# Patient Record
Sex: Male | Born: 1935
Health system: Southern US, Community
[De-identification: ages and names within clinical notes are randomized; demographics above are authoritative.]

## PROBLEM LIST (undated history)

## (undated) DIAGNOSIS — N189 Chronic kidney disease, unspecified: Secondary | ICD-10-CM

## (undated) DIAGNOSIS — E785 Hyperlipidemia, unspecified: Secondary | ICD-10-CM

## (undated) DIAGNOSIS — D141 Benign neoplasm of larynx: Secondary | ICD-10-CM

## (undated) DIAGNOSIS — I2510 Atherosclerotic heart disease of native coronary artery without angina pectoris: Secondary | ICD-10-CM

## (undated) DIAGNOSIS — C61 Malignant neoplasm of prostate: Secondary | ICD-10-CM

## (undated) DIAGNOSIS — K635 Polyp of colon: Secondary | ICD-10-CM

## (undated) DIAGNOSIS — I10 Essential (primary) hypertension: Secondary | ICD-10-CM

## (undated) HISTORY — DX: Atherosclerotic heart disease of native coronary artery without angina pectoris: I25.10

## (undated) HISTORY — DX: Polyp of colon: K63.5

## (undated) HISTORY — DX: Chronic kidney disease, unspecified: N18.9

## (undated) HISTORY — PX: POLYPECTOMY: SHX149

## (undated) HISTORY — DX: Benign neoplasm of larynx: D14.1

## (undated) HISTORY — DX: Hyperlipidemia, unspecified: E78.5

## (undated) HISTORY — PX: PROSTATE SURGERY: SHX751

## (undated) HISTORY — PX: BLADDER SURGERY: SHX569

---

## 1999-11-16 ENCOUNTER — Encounter: Payer: Self-pay | Admitting: Emergency Medicine

## 1999-11-16 ENCOUNTER — Emergency Department (HOSPITAL_COMMUNITY): Admission: EM | Admit: 1999-11-16 | Discharge: 1999-11-16 | Payer: Self-pay | Admitting: Emergency Medicine

## 2003-07-31 ENCOUNTER — Encounter: Admission: RE | Admit: 2003-07-31 | Discharge: 2003-07-31 | Payer: Self-pay | Admitting: Family Medicine

## 2003-11-06 ENCOUNTER — Ambulatory Visit (HOSPITAL_COMMUNITY): Admission: RE | Admit: 2003-11-06 | Discharge: 2003-11-06 | Payer: Self-pay | Admitting: Gastroenterology

## 2003-11-06 ENCOUNTER — Encounter (INDEPENDENT_AMBULATORY_CARE_PROVIDER_SITE_OTHER): Payer: Self-pay | Admitting: Specialist

## 2004-03-24 ENCOUNTER — Encounter: Admission: RE | Admit: 2004-03-24 | Discharge: 2004-03-24 | Payer: Self-pay | Admitting: Interventional Cardiology

## 2004-03-25 ENCOUNTER — Inpatient Hospital Stay (HOSPITAL_BASED_OUTPATIENT_CLINIC_OR_DEPARTMENT_OTHER): Admission: RE | Admit: 2004-03-25 | Discharge: 2004-03-25 | Payer: Self-pay | Admitting: Interventional Cardiology

## 2004-04-14 ENCOUNTER — Encounter (HOSPITAL_COMMUNITY): Admission: RE | Admit: 2004-04-14 | Discharge: 2004-07-13 | Payer: Self-pay | Admitting: Interventional Cardiology

## 2004-07-14 ENCOUNTER — Encounter (HOSPITAL_COMMUNITY): Admission: RE | Admit: 2004-07-14 | Discharge: 2004-07-19 | Payer: Self-pay | Admitting: Interventional Cardiology

## 2004-07-29 ENCOUNTER — Encounter (HOSPITAL_COMMUNITY): Admission: RE | Admit: 2004-07-29 | Discharge: 2004-10-27 | Payer: Self-pay | Admitting: Interventional Cardiology

## 2004-10-29 ENCOUNTER — Encounter (HOSPITAL_COMMUNITY): Admission: RE | Admit: 2004-10-29 | Discharge: 2005-01-27 | Payer: Self-pay | Admitting: Interventional Cardiology

## 2005-06-05 ENCOUNTER — Ambulatory Visit (HOSPITAL_COMMUNITY): Admission: RE | Admit: 2005-06-05 | Discharge: 2005-06-05 | Payer: Self-pay | Admitting: Otolaryngology

## 2005-06-05 ENCOUNTER — Encounter (INDEPENDENT_AMBULATORY_CARE_PROVIDER_SITE_OTHER): Payer: Self-pay | Admitting: *Deleted

## 2005-10-01 ENCOUNTER — Ambulatory Visit (HOSPITAL_COMMUNITY): Admission: RE | Admit: 2005-10-01 | Discharge: 2005-10-01 | Payer: Self-pay | Admitting: Otolaryngology

## 2006-06-22 ENCOUNTER — Ambulatory Visit: Payer: Self-pay | Admitting: Internal Medicine

## 2006-07-06 ENCOUNTER — Encounter: Admission: RE | Admit: 2006-07-06 | Discharge: 2006-07-06 | Payer: Self-pay | Admitting: Otolaryngology

## 2006-07-06 ENCOUNTER — Ambulatory Visit (HOSPITAL_BASED_OUTPATIENT_CLINIC_OR_DEPARTMENT_OTHER): Admission: RE | Admit: 2006-07-06 | Discharge: 2006-07-06 | Payer: Self-pay | Admitting: Otolaryngology

## 2006-07-26 ENCOUNTER — Ambulatory Visit (HOSPITAL_COMMUNITY): Admission: RE | Admit: 2006-07-26 | Discharge: 2006-07-26 | Payer: Self-pay | Admitting: Otolaryngology

## 2006-08-05 ENCOUNTER — Ambulatory Visit: Payer: Self-pay | Admitting: Family Medicine

## 2006-08-16 ENCOUNTER — Ambulatory Visit: Payer: Self-pay | Admitting: Family Medicine

## 2006-08-16 LAB — CONVERTED CEMR LAB
Chloride: 111 meq/L (ref 96–112)
Chol/HDL Ratio, serum: 2.8
Cholesterol: 119 mg/dL (ref 0–200)
GFR calc non Af Amer: 46 mL/min
Glomerular Filtration Rate, Af Am: 55 mL/min/{1.73_m2}
Glucose, Bld: 110 mg/dL — ABNORMAL HIGH (ref 70–99)
HDL: 42.4 mg/dL (ref >39.0)
LDL Cholesterol: 56 mg/dL (ref 0–99)
Potassium: 4 meq/L (ref 3.5–5.1)
Sodium: 141 meq/L (ref 135–145)
Triglyceride fasting, serum: 101 mg/dL (ref 0–149)

## 2006-09-02 ENCOUNTER — Ambulatory Visit: Payer: Self-pay | Admitting: Family Medicine

## 2006-11-04 ENCOUNTER — Ambulatory Visit: Payer: Self-pay | Admitting: Family Medicine

## 2006-11-04 LAB — CONVERTED CEMR LAB
Creatinine,U: 67.1 mg/dL
Hgb A1c MFr Bld: 7.5 % — ABNORMAL HIGH (ref 4.6–6.0)

## 2007-01-17 ENCOUNTER — Encounter (INDEPENDENT_AMBULATORY_CARE_PROVIDER_SITE_OTHER): Payer: Self-pay | Admitting: Family Medicine

## 2007-02-01 DIAGNOSIS — J309 Allergic rhinitis, unspecified: Secondary | ICD-10-CM | POA: Insufficient documentation

## 2007-02-01 DIAGNOSIS — E109 Type 1 diabetes mellitus without complications: Secondary | ICD-10-CM | POA: Insufficient documentation

## 2007-02-01 DIAGNOSIS — I10 Essential (primary) hypertension: Secondary | ICD-10-CM | POA: Insufficient documentation

## 2007-02-01 DIAGNOSIS — I455 Other specified heart block: Secondary | ICD-10-CM

## 2007-02-03 ENCOUNTER — Encounter: Payer: Self-pay | Admitting: Family Medicine

## 2007-02-03 ENCOUNTER — Encounter (INDEPENDENT_AMBULATORY_CARE_PROVIDER_SITE_OTHER): Payer: Self-pay | Admitting: Family Medicine

## 2007-02-03 ENCOUNTER — Ambulatory Visit: Payer: Self-pay | Admitting: Family Medicine

## 2007-02-03 DIAGNOSIS — E785 Hyperlipidemia, unspecified: Secondary | ICD-10-CM

## 2007-04-21 ENCOUNTER — Encounter: Payer: Self-pay | Admitting: Pulmonary Disease

## 2007-04-25 ENCOUNTER — Ambulatory Visit (HOSPITAL_BASED_OUTPATIENT_CLINIC_OR_DEPARTMENT_OTHER): Admission: RE | Admit: 2007-04-25 | Discharge: 2007-04-25 | Payer: Self-pay | Admitting: Otolaryngology

## 2007-05-03 ENCOUNTER — Encounter (INDEPENDENT_AMBULATORY_CARE_PROVIDER_SITE_OTHER): Payer: Self-pay | Admitting: Family Medicine

## 2007-11-21 ENCOUNTER — Encounter: Admission: RE | Admit: 2007-11-21 | Discharge: 2008-02-19 | Payer: Self-pay | Admitting: Otolaryngology

## 2008-06-06 ENCOUNTER — Encounter: Admission: RE | Admit: 2008-06-06 | Discharge: 2008-06-06 | Payer: Self-pay | Admitting: Internal Medicine

## 2009-09-28 HISTORY — PX: CIRCUMCISION: SUR203

## 2010-10-20 ENCOUNTER — Encounter: Payer: Self-pay | Admitting: Internal Medicine

## 2011-02-10 NOTE — Op Note (Signed)
NAME:  Aaron Carlson, Aaron Carlson NO.:  [id number]   MEDICAL RECORD NO.:  [id number]          PATIENT TYPE:  AMB   LOCATION:  DSC                          FACILITY:  [facility]   PHYSICIAN:  Jefry H. 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   PROCEDURE:  Laser microlaryngoscopy.   SURGEON:  Jefry H. Pollyann Kennedy, MD   General endotracheal anesthesia was used.  No complications.   FINDINGS:  Small buds of papillomatous tissue, right mid membranous  vocal fold.  Remainder of the larynx was normal.  No complications.  No  blood loss.   HISTORY:  A 77 year old gentleman with a history of recurrent  respiratory papillomatosis as an adult.  He has had recent worsening of  his voice again.  Risks, benefits, alternatives, complications of the  procedure were explained to the patient, who seemed to understand and  agreed to surgery.   PROCEDURE:  The patient was taken to the operating room and placed on  the operating table in supine position.  Following induction of general  endotracheal anesthesia, the table was turned and the patient was draped  in a standard fashion.  A maxillary tooth protector was used.  A Jako  laryngoscope was entered into the oral cavity, used to view the larynx,  and secured to the Mayo stand with the suspension apparatus.  The  microscope was brought into the field as well.  The area of the larynx  was closely inspected under microscope.  A carbon dioxide laser was  attached and a focus beam of 1 watt continuous power was used to ablate  all of the abnormal papillomatous tissue.  There was no bleeding.  The  laryngoscope was removed and the patient was awakened, extubated, and  transferred to recovery in stable condition.      Jefry H. Pollyann Kennedy, MD  Electronically Signed     JHR/MEDQ  D:  04/25/2007  T:  04/26/2007  Job:  [id number]

## 2011-02-13 NOTE — Cardiovascular Report (Signed)
NAME:  Aaron Carlson, Aaron Carlson NO.:  [id number]   MEDICAL RECORD NO.:  [id number]                   PATIENT TYPE:  OIB   LOCATION:  6501                                 FACILITY:  [facility]   PHYSICIAN:  Lesleigh Noe, M.D.            DATE OF BIRTH:  1932/12/11   DATE OF PROCEDURE:  DATE OF DISCHARGE:                              CARDIAC CATHETERIZATION   INDICATION:  Abnormal Cardiolite study in this gentleman who gave Dr.  __________a history of atypical chest pain and exertional dyspnea.  He is  diabetic.  Cardiolite study demonstrated inferior ischemia.  Procedure is  being done to rule out significant coronary artery disease.   PROCEDURE PERFORMED:  1. Left-heart catheterization.  2. Selective coronary angiogram.  3. Left ventriculography.   DESCRIPTION OF PROCEDURE:  After informed consent, a 4-French sheath was  placed in the right femoral artery using the modified Seldinger technique.  A 4-French A2 multipurpose catheter was used for hemodynamic recordings,  left ventriculography by hand injection, and selective left and right  coronary angiography.  Left coronary angiography was performed with a 4-  [place] #4, left Judkins catheter.  The patient tolerated the procedure  without complications.  Hemostasis was achieved without difficulty.   RESULTS:  1. Hemodynamic data.     A. Left ventricular pressure 159/11.     B. Aortic pressure 158/80.  2. Left ventriculography:  The left ventricular cavity is upper normal.     Overall, contractility is normal.  EF is 60%.  No regional wall motion     abnormality is noted.  No mitral regurgitation is noted.  3. Coronary angiography.     A. Left main coronary:  Calcified.  No significant obstruction.     B. Left anterior descending coronary:  Proximal calcification is noted.        There is eccentric mid 60 to 70% stenosis, and there is distal 90%        stenosis with moderate diffuse disease noted in the  distal segment        beyond the stenosis.  The first diagonal is large and contains a 70 to        80% mid stenosis with diffuse narrowing beyond this region of        obstruction noted.  The diagonal appears graftable.  The distal LAD        may not be graftable.     C. Circumflex artery:  The circumflex coronary artery is diffusely        disease.  The first obtuse marginal is moderate in size and contains        70 to 80% mid-vessel stenosis.  Beyond this, there is possible        graftability.  The circumflex beyond the first obtuse marginal branch        is 70 to 80% narrowed.  The second obtuse marginal is  large.     D. Right coronary:  The right coronary is dominant.  It gives origin to        three left ventricular branches.  The third LV branch is large and        bifurcates.  This is diffusely diseased and contains 90% proximal        narrowing.  The PDA is 100% obstructed in its mid portion.  There is        late left-to-right filling of the distal PDA.  The mid RCA contains 40        to 60% mid vessel narrowing.  There is calcification noted in the mid        and proximal RCA.   CONCLUSIONS:  1. Severe, diffuse, diabetic disease with high-grade obstruction in each of     the main coronary territories. The mid PDA is totally occluded.  A large     left ventricular branch contains high-grade proximal obstruction.  The     mid RCA is 50% narrowed.  The mid LAD is 60 to 70% narrowed.  The distal     LAD is 90% narrowed.  The diagonal contains high-grade obstruction in the     mid and distal vessel as well.  The circumflex first obtuse marginal     branch is 80% obstruction as is the mid circumflex after this obtuse     marginal.  2. Normal LV function.   PLAN:  Continued, aggressive risk-factor modification/ medical therapy  versus coronary artery bypass grafting.  We will refer for surgical opinion.                                               Lesleigh Noe,  M.D.    HWS/MEDQ  D:  03/25/2004  T:  03/25/2004  Job:  [id number]   cc:   Selmer Dominion, Dr.   Hale Drone

## 2011-04-13 ENCOUNTER — Other Ambulatory Visit: Payer: Self-pay | Admitting: Urology

## 2011-04-13 ENCOUNTER — Encounter (HOSPITAL_COMMUNITY): Payer: Medicare Other

## 2011-04-13 LAB — BASIC METABOLIC PANEL WITH GFR
BUN: 32 mg/dL — ABNORMAL HIGH (ref 6–23)
CO2: 22 meq/L (ref 19–32)
Calcium: 10 mg/dL (ref 8.4–10.5)
Chloride: 108 meq/L (ref 96–112)
Creatinine, Ser: 1.68 mg/dL — ABNORMAL HIGH (ref 0.50–1.35)
GFR calc Af Amer: 48 mL/min — ABNORMAL LOW
GFR calc non Af Amer: 40 mL/min — ABNORMAL LOW
Glucose, Bld: 122 mg/dL — ABNORMAL HIGH (ref 70–99)
Potassium: 4.5 meq/L (ref 3.5–5.1)
Sodium: 139 meq/L (ref 135–145)

## 2011-04-13 LAB — SURGICAL PCR SCREEN
MRSA, PCR: NEGATIVE
Staphylococcus aureus: NEGATIVE

## 2011-04-13 LAB — CBC
HCT: 32.4 % — ABNORMAL LOW (ref 39.0–52.0)
Hemoglobin: 10.7 g/dL — ABNORMAL LOW (ref 13.0–17.0)
MCH: 28.7 pg (ref 26.0–34.0)
MCHC: 33 g/dL (ref 30.0–36.0)
MCV: 86.9 fL (ref 78.0–100.0)
Platelets: 226 K/uL (ref 150–400)
RBC: 3.73 MIL/uL — ABNORMAL LOW (ref 4.22–5.81)
RDW: 13.8 % (ref 11.5–15.5)
WBC: 9.1 K/uL (ref 4.0–10.5)

## 2011-04-14 ENCOUNTER — Ambulatory Visit (HOSPITAL_COMMUNITY)
Admission: RE | Admit: 2011-04-14 | Discharge: 2011-04-14 | Disposition: A | Discharge status: Home / Self Care | Payer: Medicare Other | Source: Ambulatory Visit | Attending: Urology | Admitting: Urology

## 2011-04-14 ENCOUNTER — Ambulatory Visit (HOSPITAL_BASED_OUTPATIENT_CLINIC_OR_DEPARTMENT_OTHER): Admission: RE | Admit: 2011-04-14 | Payer: Medicare Other | Source: Ambulatory Visit | Admitting: Urology

## 2011-04-14 ENCOUNTER — Ambulatory Visit (HOSPITAL_COMMUNITY): Payer: Medicare Other

## 2011-04-14 DIAGNOSIS — N478 Other disorders of prepuce: Secondary | ICD-10-CM | POA: Insufficient documentation

## 2011-04-14 DIAGNOSIS — Z79899 Other long term (current) drug therapy: Secondary | ICD-10-CM | POA: Insufficient documentation

## 2011-04-14 DIAGNOSIS — I10 Essential (primary) hypertension: Secondary | ICD-10-CM | POA: Insufficient documentation

## 2011-04-14 DIAGNOSIS — Z7982 Long term (current) use of aspirin: Secondary | ICD-10-CM | POA: Insufficient documentation

## 2011-04-14 DIAGNOSIS — R0602 Shortness of breath: Secondary | ICD-10-CM | POA: Insufficient documentation

## 2011-04-14 DIAGNOSIS — Z01811 Encounter for preprocedural respiratory examination: Secondary | ICD-10-CM | POA: Insufficient documentation

## 2011-04-14 DIAGNOSIS — I2510 Atherosclerotic heart disease of native coronary artery without angina pectoris: Secondary | ICD-10-CM | POA: Insufficient documentation

## 2011-04-14 DIAGNOSIS — Z794 Long term (current) use of insulin: Secondary | ICD-10-CM | POA: Insufficient documentation

## 2011-04-14 DIAGNOSIS — E119 Type 2 diabetes mellitus without complications: Secondary | ICD-10-CM | POA: Insufficient documentation

## 2011-04-14 DIAGNOSIS — N471 Phimosis: Secondary | ICD-10-CM | POA: Insufficient documentation

## 2011-04-14 DIAGNOSIS — Z01812 Encounter for preprocedural laboratory examination: Secondary | ICD-10-CM | POA: Insufficient documentation

## 2011-04-14 DIAGNOSIS — R059 Cough, unspecified: Secondary | ICD-10-CM | POA: Insufficient documentation

## 2011-04-14 DIAGNOSIS — R05 Cough: Secondary | ICD-10-CM | POA: Insufficient documentation

## 2011-04-14 DIAGNOSIS — I209 Angina pectoris, unspecified: Secondary | ICD-10-CM | POA: Insufficient documentation

## 2011-04-14 LAB — GLUCOSE, CAPILLARY
Glucose-Capillary: 209 mg/dL — ABNORMAL HIGH (ref 70–99)
Glucose-Capillary: 146 mg/dL — ABNORMAL HIGH (ref 70–99)

## 2011-05-02 NOTE — Op Note (Signed)
  NAME:  [name], [name] NO.:  [id number]  MEDICAL RECORD NO.:  [id number]  LOCATION:  DAYL                         FACILITY:  [facility]  PHYSICIAN:  Danae Chen, M.D.  DATE OF BIRTH:  06-24-1933  DATE OF PROCEDURE:  04/14/2011 DATE OF DISCHARGE:                              OPERATIVE REPORT   PREOPERATIVE DIAGNOSIS:  Phimosis.  POSTOPERATIVE DIAGNOSIS:  Phimosis.  PROCEDURE:  Circumcision.  SURGEON:  Danae Chen, M.D.  ANESTHESIA:  General.  INDICATIONS:  The patient is a 77 year old male status post radical cystoprostatectomy several years ago.  For the past several months, he has been having difficulty retracting his foreskin and was unable to clean it himself.  On physical examination, he was found to have severe phimosis.  He is scheduled today for circumcision.  The patient was identified by his wrist band and proper time-out was taken.  Under general anesthesia, he was prepped and draped and placed in supine position.  A penile block was done with 0.25% Marcaine.  Then, a dorsal and ventral slit were made.  Then, the foreskin in between those two incisions was excised.  Hemostasis was secured with electrocautery.  Then, a frenulotomy was done.  Then, skin approximation was done with #4-0 chromic.  The patient tolerated the procedure well and left the OR in satisfactory condition to post anesthesia care unit.     Danae Chen, M.D.     MN/MEDQ  D:  04/14/2011  T:  04/14/2011  Job:  [id number]  Electronically Signed by Lindaann Slough M.D. on 05/02/2011 07:35:45 AM

## 2011-07-13 LAB — POCT HEMOGLOBIN-HEMACUE: Operator id: 116011

## 2011-07-13 LAB — BASIC METABOLIC PANEL
BUN: 24 — ABNORMAL HIGH
Creatinine, Ser: 1.64 — ABNORMAL HIGH
GFR calc non Af Amer: 42 — ABNORMAL LOW
Potassium: 5

## 2011-08-26 DIAGNOSIS — D141 Benign neoplasm of larynx: Secondary | ICD-10-CM | POA: Insufficient documentation

## 2011-09-19 ENCOUNTER — Emergency Department (HOSPITAL_BASED_OUTPATIENT_CLINIC_OR_DEPARTMENT_OTHER)
Admission: EM | Admit: 2011-09-19 | Discharge: 2011-09-19 | Disposition: A | Discharge status: Home / Self Care | Payer: Medicare Other | Attending: Emergency Medicine | Admitting: Emergency Medicine

## 2011-09-19 ENCOUNTER — Emergency Department (INDEPENDENT_AMBULATORY_CARE_PROVIDER_SITE_OTHER): Payer: Medicare Other

## 2011-09-19 ENCOUNTER — Encounter: Payer: Self-pay | Admitting: Emergency Medicine

## 2011-09-19 DIAGNOSIS — S0083XA Contusion of other part of head, initial encounter: Secondary | ICD-10-CM

## 2011-09-19 DIAGNOSIS — Y9210 Unspecified residential institution as the place of occurrence of the external cause: Secondary | ICD-10-CM | POA: Insufficient documentation

## 2011-09-19 DIAGNOSIS — W010XXA Fall on same level from slipping, tripping and stumbling without subsequent striking against object, initial encounter: Secondary | ICD-10-CM | POA: Insufficient documentation

## 2011-09-19 DIAGNOSIS — R609 Edema, unspecified: Secondary | ICD-10-CM

## 2011-09-19 DIAGNOSIS — S0990XA Unspecified injury of head, initial encounter: Secondary | ICD-10-CM

## 2011-09-19 DIAGNOSIS — S0003XA Contusion of scalp, initial encounter: Secondary | ICD-10-CM | POA: Insufficient documentation

## 2011-09-19 DIAGNOSIS — E119 Type 2 diabetes mellitus without complications: Secondary | ICD-10-CM | POA: Insufficient documentation

## 2011-09-19 DIAGNOSIS — I10 Essential (primary) hypertension: Secondary | ICD-10-CM | POA: Insufficient documentation

## 2011-09-19 DIAGNOSIS — W19XXXA Unspecified fall, initial encounter: Secondary | ICD-10-CM

## 2011-09-19 DIAGNOSIS — Z79899 Other long term (current) drug therapy: Secondary | ICD-10-CM | POA: Insufficient documentation

## 2011-09-19 DIAGNOSIS — S1093XA Contusion of unspecified part of neck, initial encounter: Secondary | ICD-10-CM | POA: Insufficient documentation

## 2011-09-19 DIAGNOSIS — G319 Degenerative disease of nervous system, unspecified: Secondary | ICD-10-CM

## 2011-09-19 HISTORY — DX: Essential (primary) hypertension: I10

## 2011-09-19 HISTORY — DX: Malignant neoplasm of prostate: C61

## 2011-09-19 NOTE — ED Provider Notes (Signed)
Medical screening examination/treatment/procedure(s) were conducted as a shared visit with non-physician practitioner(s) and myself.  I personally evaluated the patient during the encounter  Results for orders placed during the hospital encounter of 04/14/11  GLUCOSE, CAPILLARY      Component Value Range   Glucose-Capillary 209 (*) 70 - 99 (mg/dL)  GLUCOSE, CAPILLARY      Component Value Range   Glucose-Capillary 146 (*) 70 - 99 (mg/dL)   Ct Head Wo Contrast  09/19/2011  *RADIOLOGY REPORT*  Clinical Data: Fall, hit head.  CT HEAD WITHOUT CONTRAST  Technique:  Contiguous axial images were obtained from the base of the skull through the vertex without contrast.  Comparison: None.  Findings: Soft tissue swelling over the right forehead. There is atrophy and chronic small vessel disease changes. No acute intracranial abnormality.  Specifically, no hemorrhage, hydrocephalus, mass lesion, acute infarction, or significant intracranial injury.  No acute calvarial abnormality. Visualized paranasal sinuses and mastoids clear.  Orbital soft tissues unremarkable.  IMPRESSION: No acute intracranial abnormality.  Atrophy, chronic microvascular disease.  Original Report Authenticated By: Cyndie Chime, M.D.   S/P fall no loc hit right forehead with contusio. Head CT negative for sig injury.      Shelda Jakes, MD 09/19/11 2230

## 2011-09-19 NOTE — ED Notes (Signed)
Pt reports falling d/t "missed last step" off of stage at church, hit head on a piece of furniture; denies LOC or any other pain.

## 2011-09-19 NOTE — ED Provider Notes (Signed)
History     CSN: [id number]  Arrival date & time 09/19/11  1231   First MD Initiated Contact with Patient 09/19/11 1315      Chief Complaint  Patient presents with  . Fall    (Consider location/radiation/quality/duration/timing/severity/associated sxs/prior treatment) HPI Comments: "missed a step" at church and fell.  Struck R forehead on piece of furniture.  No neck pain.  No other injuries.  Patient is a 77 y.o. male presenting with fall. The history is provided by the patient and the spouse. No language interpreter was used.  Fall The accident occurred 1 to 2 hours ago. Incident: at church. Distance fallen: standing height. He landed on carpet. There was no blood loss. The point of impact was the head. The pain is present in the head. The pain is mild. He was ambulatory at the scene. There was no entrapment after the fall. There was no drug use involved in the accident. There was no alcohol use involved in the accident. Pertinent negatives include no visual change, no nausea, no vomiting, no headaches, no hearing loss and no loss of consciousness.    Past Medical History  Diagnosis Date  . Diabetes mellitus   . Hypertension   . Prostate cancer     w/ mets to bladder    Past Surgical History  Procedure Date  . Bladder surgery   . Prostate surgery   . Polypectomy     vocal cords  . Circumcision 2011    No family history on file.  History  Substance Use Topics  . Smoking status: Former Games developer  . Smokeless tobacco: Not on file  . Alcohol Use: Yes     social      Review of Systems  HENT: Positive for facial swelling. Negative for neck pain and ear discharge.   Gastrointestinal: Negative for nausea and vomiting.  Skin:       Swelling   Neurological: Negative for loss of consciousness and headaches.  All other systems reviewed and are negative.    Allergies  Penicillins  Home Medications   Current Outpatient Rx  Name Route Sig Dispense Refill  .  ASPIRIN 81 MG PO TABS Oral Take 81 mg by mouth daily.      . ATORVASTATIN CALCIUM 40 MG PO TABS Oral Take 40 mg by mouth daily.      . AZELASTINE HCL 137 MCG/SPRAY NA SOLN Nasal Place 1 spray into the nose as needed. Use in each nostril as directed     . BENAZEPRIL HCL 20 MG PO TABS Oral Take 20 mg by mouth daily.      Marland Kitchen CALCIUM + D PO Oral Take 1 tablet by mouth.      Marland Kitchen FEXOFENADINE HCL 180 MG PO TABS Oral Take 180 mg by mouth daily.      . OMEGA-3 FATTY ACIDS 1000 MG PO CAPS Oral Take 1 g by mouth daily.      Marland Kitchen GARLIC 500 MG PO TABS Oral Take 1 tablet by mouth.      . INSULIN LISPRO (HUMAN) 100 UNIT/ML [place] SOLN Subcutaneous Inject into the skin at bedtime. Sliding scale     . INSULIN ISOPHANE HUMAN 100 UNIT/ML [place] SUSP Subcutaneous Inject 50 Units into the skin 2 (two) times daily before a meal.      . ISOSORBIDE MONONITRATE ER 60 MG PO TB24 Oral Take 60 mg by mouth daily.      Marland Kitchen METOPROLOL SUCCINATE ER 50 MG PO TB24 Oral Take  75 mg by mouth daily.      Marland Kitchen ONE-DAILY MULTI VITAMINS PO TABS Oral Take 1 tablet by mouth daily.        BP 157/73  Pulse 76  Temp(Src) 97.4 F (36.3 C) (Oral)  Resp 18  SpO2 99%  Physical Exam  Nursing note and vitals reviewed. Constitutional: He is oriented to person, place, and time. He appears well-developed and well-nourished. No distress.  HENT:  Head: Normocephalic and atraumatic. Head is without raccoon's eyes and without Battle's sign.    Right Ear: External ear normal.  Left Ear: External ear normal.  Nose: Nose normal.  Eyes: EOM are normal. Pupils are equal, round, and reactive to light.  Neck: Normal range of motion and full passive range of motion without pain. Neck supple.  Cardiovascular: Normal rate, regular rhythm, normal heart sounds and intact distal pulses.   Pulmonary/Chest: Effort normal and breath sounds normal. No respiratory distress.  Abdominal: Soft. He exhibits no distension. There is no tenderness.  Musculoskeletal: Normal range  of motion.  Neurological: He is alert and oriented to person, place, and time. He has normal strength. He displays normal reflexes. No cranial nerve deficit or sensory deficit. He displays a negative Romberg sign. Coordination and gait normal. GCS eye subscore is 4. GCS verbal subscore is 5. GCS motor subscore is 6.  Skin: Skin is warm and dry. He is not diaphoretic.  Psychiatric: He has a normal mood and affect. Judgment normal.    ED Course  Procedures (including critical care time)  Labs Reviewed - No data to display No results found.   No diagnosis found.    MDM          Worthy Rancher, PA 09/19/11 [phone]

## 2011-09-19 NOTE — ED Notes (Signed)
Addendum to Pain assessment charted at 1302: sts only hurts if RT side head touched

## 2013-08-07 ENCOUNTER — Encounter: Payer: Self-pay | Admitting: Interventional Cardiology

## 2013-08-19 ENCOUNTER — Encounter: Payer: Self-pay | Admitting: Interventional Cardiology

## 2013-08-28 ENCOUNTER — Ambulatory Visit (INDEPENDENT_AMBULATORY_CARE_PROVIDER_SITE_OTHER): Payer: Medicare Other | Admitting: Interventional Cardiology

## 2013-08-28 ENCOUNTER — Encounter: Payer: Self-pay | Admitting: Interventional Cardiology

## 2013-08-28 VITALS — BP 122/70 | HR 65 | Ht 69.0 in | Wt 245.0 lb

## 2013-08-28 DIAGNOSIS — I2510 Atherosclerotic heart disease of native coronary artery without angina pectoris: Secondary | ICD-10-CM

## 2013-08-28 DIAGNOSIS — I25119 Atherosclerotic heart disease of native coronary artery with unspecified angina pectoris: Secondary | ICD-10-CM | POA: Insufficient documentation

## 2013-08-28 DIAGNOSIS — I10 Essential (primary) hypertension: Secondary | ICD-10-CM

## 2013-08-28 DIAGNOSIS — I452 Bifascicular block: Secondary | ICD-10-CM

## 2013-08-28 DIAGNOSIS — I25810 Atherosclerosis of coronary artery bypass graft(s) without angina pectoris: Secondary | ICD-10-CM

## 2013-08-28 DIAGNOSIS — E785 Hyperlipidemia, unspecified: Secondary | ICD-10-CM | POA: Insufficient documentation

## 2013-08-28 DIAGNOSIS — I209 Angina pectoris, unspecified: Secondary | ICD-10-CM

## 2013-08-28 DIAGNOSIS — E109 Type 1 diabetes mellitus without complications: Secondary | ICD-10-CM

## 2013-08-28 HISTORY — DX: Atherosclerotic heart disease of native coronary artery without angina pectoris: I25.10

## 2013-08-28 MED ORDER — NITROGLYCERIN 0.4 MG SL SUBL: 0.4000 mg | SUBLINGUAL_TABLET | SUBLINGUAL | Status: DC | PRN

## 2013-08-28 NOTE — Progress Notes (Signed)
Patient ID: Aaron Carlson, male   DOB: August 30, 1933, 79 y.o.   MRN: [id number]    [address], [address] [place], [place]  [address] Phone: [phone] Fax:  [phone]  Date:  08/28/2013   ID:  Aaron Carlson, DOB November 27, 1932, MRN [id number]  PCP:  Alva Garnet., MD   ASSESSMENT:  1. New onset exertional angina over the past 6 months 2. Prior history of CAD with diabetic distal vessel disease by cath, remote 3. Hypertension 4. Hyperlipidemia   PLAN:  1. Nitroglycerin to use for episodes of chest discomfort. May need to increase the dose of Imdur to 120 mg daily 2. Pharmacologic nuclear study to assess severity of ischemia/risk stratify    SUBJECTIVE: Aaron Carlson is a 79 y.o. male who has noticed exertional chest pressure over the past 6 months. In activities such as moving his trash cans, walking a distance, and other activities are associated with the discomfort. It is relieved with rest. He denies orthopnea, PND, radiation, syncope, and palpitations   Wt Readings from Last 3 Encounters:  08/28/13 245 lb (111.131 kg)  02/03/07 237 lb 8 oz (107.729 kg)     Past Medical History  Diagnosis Date  . Diabetes mellitus   . Hypertension   . Prostate cancer     w/ mets to bladder    Current Outpatient Prescriptions  Medication Sig Dispense Refill  . aspirin 81 MG tablet Take 81 mg by mouth daily.        . benazepril (LOTENSIN) 20 MG tablet Take 20 mg by mouth daily.        . Calcium Carbonate-Vitamin D (CALCIUM + D PO) Take 1 tablet by mouth.        . fexofenadine (ALLEGRA) 180 MG tablet Take 180 mg by mouth daily.        . fish oil-omega-3 fatty acids 1000 MG capsule Take 1 g by mouth daily.        . Garlic 500 MG TABS Take 1 tablet by mouth.        . insulin lispro (HUMALOG) 100 UNIT/ML injection Inject into the skin at bedtime. Sliding scale      . insulin NPH (HUMULIN N,NOVOLIN N) 100 UNIT/ML injection Inject 45 Units into the skin 2 (two) times daily  before a meal.       . isosorbide mononitrate (IMDUR) 60 MG 24 hr tablet Take 60 mg by mouth daily.        . metoprolol (TOPROL-XL) 50 MG 24 hr tablet Take 75 mg by mouth daily.        . Multiple Vitamin (MULTIVITAMIN) tablet Take 1 tablet by mouth daily.        . niacin (NIASPAN) 500 MG CR tablet Take 500 mg by mouth at bedtime.      . ONE TOUCH ULTRA TEST test strip       . OVER THE COUNTER MEDICATION TAKE TWO TABLETS OF RED REISHI DAILY BY MOUTH       No current facility-administered medications for this visit.    Allergies:    Allergies  Allergen Reactions  . Penicillins Rash    Social History:  The patient  reports that he has quit smoking. He does not have any smokeless tobacco history on file. He reports that he drinks alcohol. He reports that he does not use illicit drugs.   ROS:  Please see the history of present illness.   Prior abnormal nuclear study with catheterization  demonstrating diffuse distal vessel disease. Denies transient neurological symptoms. No claudication. No peripheral edema.   All other systems reviewed and negative.   OBJECTIVE: VS:  BP 122/70  Pulse 65  Ht 5\' 9"  (1.753 m)  Wt 245 lb (111.131 kg)  BMI 36.16 kg/m2  SpO2 94% Well nourished, well developed, in no acute distress, obese HEENT: normal Neck: JVD flat. Carotid bruit absent  Cardiac:  normal S1, S2; RRR; 1-2 of 6 systolic murmur right upper sternal border  Lungs:  clear to auscultation bilaterally, no wheezing, rhonchi or rales Abd: soft, nontender, no hepatomegaly Ext: Edema absent. Pulses 2+ Skin: warm and dry Neuro:  CNs 2-12 intact, no focal abnormalities noted  EKG:  Right bundle left anterior hemiblock, normal sinus rhythm, no change from prior       Signed, Darci Needle III, MD 08/28/2013 12:05 PM

## 2013-08-28 NOTE — Patient Instructions (Signed)
An Rx for Nitroglycerin has been sent to your pharmacy  Your physician has requested that you have a lexiscan myoview. For further information please visit [email or web address] Please follow instruction sheet, as given.  Your physician recommends that you schedule a follow-up appointment in: 6 months/or earlier pending Lexiscan results

## 2013-09-13 ENCOUNTER — Encounter: Payer: Self-pay | Admitting: Cardiology

## 2013-09-13 ENCOUNTER — Ambulatory Visit (HOSPITAL_COMMUNITY): Payer: Medicare Other | Attending: Cardiology | Admitting: Radiology

## 2013-09-13 VITALS — BP 149/70 | HR 64 | Ht 69.0 in | Wt 241.0 lb

## 2013-09-13 DIAGNOSIS — I10 Essential (primary) hypertension: Secondary | ICD-10-CM | POA: Insufficient documentation

## 2013-09-13 DIAGNOSIS — R0989 Other specified symptoms and signs involving the circulatory and respiratory systems: Secondary | ICD-10-CM | POA: Insufficient documentation

## 2013-09-13 DIAGNOSIS — R0609 Other forms of dyspnea: Secondary | ICD-10-CM | POA: Insufficient documentation

## 2013-09-13 DIAGNOSIS — R079 Chest pain, unspecified: Secondary | ICD-10-CM | POA: Insufficient documentation

## 2013-09-13 DIAGNOSIS — I209 Angina pectoris, unspecified: Secondary | ICD-10-CM

## 2013-09-13 DIAGNOSIS — Z87891 Personal history of nicotine dependence: Secondary | ICD-10-CM | POA: Insufficient documentation

## 2013-09-13 DIAGNOSIS — I2510 Atherosclerotic heart disease of native coronary artery without angina pectoris: Secondary | ICD-10-CM

## 2013-09-13 DIAGNOSIS — Z794 Long term (current) use of insulin: Secondary | ICD-10-CM | POA: Insufficient documentation

## 2013-09-13 DIAGNOSIS — E119 Type 2 diabetes mellitus without complications: Secondary | ICD-10-CM | POA: Insufficient documentation

## 2013-09-13 MED ORDER — TECHNETIUM TC 99M SESTAMIBI GENERIC - CARDIOLITE
10.0000 | Freq: Once | INTRAVENOUS | Status: AC | PRN
  Administered 2013-09-13: 10 via INTRAVENOUS

## 2013-09-13 MED ORDER — TECHNETIUM TC 99M SESTAMIBI GENERIC - CARDIOLITE
30.0000 | Freq: Once | INTRAVENOUS | Status: AC | PRN
  Administered 2013-09-13: 30 via INTRAVENOUS

## 2013-09-13 MED ORDER — REGADENOSON 0.4 MG/5ML IV SOLN
0.4000 mg | Freq: Once | INTRAVENOUS | Status: AC
  Administered 2013-09-13: 0.4 mg via INTRAVENOUS

## 2013-09-13 NOTE — Progress Notes (Signed)
[facility] SITE 3 NUCLEAR MED [address] [place], [place] [address] [phone]    Cardiology Nuclear Med Study  Aaron Carlson is a 78 y.o. male     MRN : [id number]     DOB: [birth year]  Procedure Date: 09/13/2013  Nuclear Med Background Indication for Stress Test:  Evaluation for Ischemia History:  CAD, Cath 2005, MPI 2005 (ischemia) Cardiac Risk Factors: History of Smoking, Hypertension, IDDM, Lipids and RBBB  Symptoms:  Chest Pain with Exertion (last date of chest discomfort was two days ago)   Nuclear Pre-Procedure Caffeine/Decaff Intake:  None > 12 hrs NPO After: 7:00pm   Lungs:  clear O2 Sat: 94% on room air. IV 0.9% NS with Angio Cath:  22g  IV Site: R Antecubital , tolerated well IV Started by:  Irean Hong, RN  Chest Size (in):  52 Cup Size: n/a  Height: 5\' 9"  (1.753 m)  Weight:  241 lb (109.317 kg)  BMI:  Body mass index is 35.57 kg/(m^2). Tech Comments:  3/4 dose Humalog,and Humulin N Insulin last night; no insulin today. Fasting CBG was 130 at 0645 today. Patient took Toprol last night. Irean Hong, RN.    Nuclear Med Study 1 or 2 day study: 1 day  Stress Test Type:  Treadmill/Lexiscan  Reading MD: Verdis Prime, MD  Order Authorizing Provider:  Verdis Prime, MD  Resting Radionuclide: Technetium 32m Sestamibi  Resting Radionuclide Dose: 11.0 mCi   Stress Radionuclide:  Technetium 1m Sestamibi  Stress Radionuclide Dose: 33.0 mCi           Stress Protocol Rest HR: 64 Stress HR: 92  Rest BP: 149/70 Stress BP: 151/51  Exercise Time (min): n/a METS: n/a           Dose of Adenosine (mg):  n/a Dose of Lexiscan: 0.4 mg  Dose of Atropine (mg): n/a Dose of Dobutamine: n/a mcg/kg/min (at max HR)  Stress Test Technologist: Nelson Chimes, BS-ES  Nuclear Technologist:  Domenic Polite, CNMT     Rest Procedure:  Myocardial perfusion imaging was performed at rest 45 minutes following the intravenous administration of Technetium 70m Sestamibi. Rest  ECG: NSR, RBBB, LAHB  Stress Procedure:  The patient received IV Lexiscan 0.4 mg over 15-seconds with concurrent low level exercise and then Technetium 3m Sestamibi was injected at 30-seconds while the patient continued walking one more minute.  Quantitative spect images were obtained after a 45-minute delay. During the infusion of Lexiscan, the patient complained of SOB and lightheadedness.  Symptoms began to resolve in recovery.  Stress ECG: No significant ST segment change suggestive of ischemia.  QPS Raw Data Images:  Mild diaphragmatic attenuation.  Normal left ventricular size. Stress Images:  There is decreased uptake in the inferior wall. Rest Images:  There is decreased uptake in the inferior wall. Subtraction (SDS):  No evidence of ischemia. Transient Ischemic Dilatation (Normal <1.22):  1.00 Lung/Heart Ratio (Normal <0.45):  0.41  Quantitative Gated Spect Images QGS EDV:  83 ml QGS ESV:  31 ml  Impression Exercise Capacity:  Lexiscan with low level exercise. BP Response:  Normal blood pressure response. Clinical Symptoms:  There is dyspnea. ECG Impression:  No significant ST segment change suggestive of ischemia. Comparison with Prior Nuclear Study: No images to compare  Overall Impression:  Low risk stress nuclear study with fixed inferior defect due to soft tissue attenuation or prior MI.  LV Ejection Fraction: 63%.  LV Wall Motion:  NL LV Function; NL Wall Motion

## 2013-09-19 ENCOUNTER — Telehealth: Payer: Self-pay

## 2013-09-19 NOTE — Telephone Encounter (Signed)
pt given results of nuclear study.No significant abnormality noted on the nuclear study.pt verbalized understandign and request a copy be mailed to him

## 2013-09-19 NOTE — Telephone Encounter (Signed)
Message copied by Jarvis Newcomer on Tue Sep 19, 2013  1:17 PM ------      Message from: Verdis Prime      Created: Mon Sep 18, 2013  6:12 PM       No significant abnormality noted on the nuclear study ------

## 2013-10-19 ENCOUNTER — Encounter: Payer: Self-pay | Admitting: Internal Medicine

## 2013-11-08 ENCOUNTER — Ambulatory Visit: Payer: Medicare Other | Admitting: Interventional Cardiology

## 2013-11-13 ENCOUNTER — Ambulatory Visit (INDEPENDENT_AMBULATORY_CARE_PROVIDER_SITE_OTHER): Payer: Medicare Other | Admitting: Internal Medicine

## 2013-11-13 ENCOUNTER — Encounter: Payer: Self-pay | Admitting: Internal Medicine

## 2013-11-13 VITALS — BP 130/70 | HR 72 | Ht 69.0 in | Wt 247.4 lb

## 2013-11-13 DIAGNOSIS — Z86010 Personal history of colonic polyps: Secondary | ICD-10-CM

## 2013-11-13 DIAGNOSIS — R079 Chest pain, unspecified: Secondary | ICD-10-CM

## 2013-11-13 DIAGNOSIS — K219 Gastro-esophageal reflux disease without esophagitis: Secondary | ICD-10-CM

## 2013-11-13 MED ORDER — OMEPRAZOLE 40 MG PO CPDR: 40.0000 mg | DELAYED_RELEASE_CAPSULE | Freq: Every day | ORAL | Status: DC

## 2013-11-13 NOTE — Patient Instructions (Signed)
We have sent the following medications to your pharmacy for you to pick up at your convenience:  Omeprazole  Please follow up with Dr. Henrene Pastor in 4 weeks.

## 2013-11-13 NOTE — Progress Notes (Signed)
HISTORY OF PRESENT ILLNESS:  Aaron Carlson is a 80 y.o. male with multiple medical problems as listed below. He is self-referred (his wife Aaron Carlson is a patient of mine) regarding problems with chest. The patient reports a greater than six-month history of near daily chest pain. He tells me that he was evaluated by his cardiologist, Dr. Daneen Schick, who ruled out cardiac etiology. The patient mentions that his chest discomfort is often exacerbated by meals and predictably relieved with antacids or belching. He does have chronic pyrosis. No dysphagia or weight loss. No nausea or vomiting. No abdominal pain. He has had gastroenterology care with Dr. Acquanetta Sit, including colonoscopy in 2005 (reviewed, adenomatous) and followup colonoscopy 3 years ago (according to patient, no polyps). He has no lower GI complaints.  REVIEW OF SYSTEMS:  All non-GI ROS negative except for night sweats muscle cramps  Past Medical History  Diagnosis Date  . Diabetes mellitus   . Hypertension   . Prostate cancer     w/ mets to bladder  . Colon polyps     adenomatous  . Chronic renal insufficiency   . Laryngeal papillomatosis   . Hyperlipemia     Past Surgical History  Procedure Laterality Date  . Bladder surgery    . Prostate surgery    . Polypectomy      vocal cords  . Circumcision  2011    Social History Aaron Carlson  reports that he has quit smoking. He has never used smokeless tobacco. He reports that he drinks alcohol. He reports that he does not use illicit drugs.  family history includes Breast cancer in his mother; Diabetes in his mother; Heart disease in his father; Heart failure in his father; Pancreatic cancer in his brother.  Allergies  Allergen Reactions  . Penicillins Rash       PHYSICAL EXAMINATION: Vital signs: BP 130/70  Pulse 72  Ht 5\' 9"  (1.753 m)  Wt 247 lb 6.4 oz (112.22 kg)  BMI 36.52 kg/m2 General: Well-developed, well-nourished, no acute distress HEENT: Sclerae are  anicteric, conjunctiva pink. Oral mucosa intact Lungs: Clear Heart: Regular Abdomen: soft, obese, nontender, nondistended, no obvious ascites, no peritoneal signs, normal bowel sounds. No organomegaly. Prior surgical incision well-healed Extremities: No edema Psychiatric: alert and oriented x3. Cooperative   ASSESSMENT:  #1. Recurrent and chronic chest pain as described. May be related to GERD #2. GERD #3. History of adenomatous colon polyps. Being followed by Dr. Penelope Coop   PLAN:  #1. Reflux precautions #2. Prescribe omeprazole 40 mg daily #3. Office followup in 4 weeks. Still symptomatic, consider endoscopy #4. Continue surveillance colonoscopy (if any) with Dr. Penelope Coop

## 2013-12-28 DIAGNOSIS — I4510 Unspecified right bundle-branch block: Secondary | ICD-10-CM | POA: Insufficient documentation

## 2014-01-01 ENCOUNTER — Other Ambulatory Visit: Payer: Self-pay | Admitting: *Deleted

## 2014-01-01 MED ORDER — ISOSORBIDE MONONITRATE ER 60 MG PO TB24: 60.0000 mg | ORAL_TABLET | Freq: Every day | ORAL | Status: DC

## 2014-01-01 MED ORDER — METOPROLOL SUCCINATE ER 50 MG PO TB24
50.0000 mg | ORAL_TABLET | Freq: Every day | ORAL | Status: DC
Start: 2014-01-01 — End: 2014-03-07

## 2014-01-02 ENCOUNTER — Ambulatory Visit: Payer: Medicare Other | Admitting: Internal Medicine

## 2014-01-16 ENCOUNTER — Telehealth: Payer: Self-pay | Admitting: Interventional Cardiology

## 2014-01-16 NOTE — Telephone Encounter (Signed)
Walk in pt form " Application For Disability [employer]" Dropped Off gave to [facility]

## 2014-01-22 ENCOUNTER — Telehealth: Payer: Self-pay

## 2014-01-22 NOTE — Telephone Encounter (Signed)
pt aware completed handicapp form is ready for pick up.pt rqst it be mailed.done

## 2014-02-07 ENCOUNTER — Telehealth: Payer: Self-pay | Admitting: Interventional Cardiology

## 2014-02-07 NOTE — Telephone Encounter (Signed)
returned call and spoke with pharmacist Clarise Cruz.pt incorrect dosage of Imdur was filled. pt Rx should be 60mg  and pt was given 30mg .Pt aware and refill corrected by pharmacy

## 2014-02-07 NOTE — Telephone Encounter (Signed)
New message          Calling to report a miss-filled medication (on 4/6 dr Tamala Julian wrote a prescription for a indoor 73 but it was filled as an indoor 30).

## 2014-02-13 ENCOUNTER — Ambulatory Visit: Payer: Medicare Other | Admitting: Internal Medicine

## 2014-03-07 ENCOUNTER — Other Ambulatory Visit: Payer: Self-pay

## 2014-03-07 MED ORDER — ISOSORBIDE MONONITRATE ER 60 MG PO TB24: 60.0000 mg | ORAL_TABLET | Freq: Every day | ORAL | Status: DC

## 2014-03-07 MED ORDER — METOPROLOL SUCCINATE ER 50 MG PO TB24
50.0000 mg | ORAL_TABLET | Freq: Every day | ORAL | Status: DC
Start: 2014-03-07 — End: 2014-09-05

## 2014-03-07 MED ORDER — BENAZEPRIL HCL 20 MG PO TABS: 20.0000 mg | ORAL_TABLET | Freq: Every day | ORAL | Status: DC

## 2014-03-13 ENCOUNTER — Ambulatory Visit: Payer: Medicare Other | Admitting: Internal Medicine

## 2014-04-16 ENCOUNTER — Ambulatory Visit: Payer: Medicare Other | Admitting: Interventional Cardiology

## 2014-06-14 ENCOUNTER — Other Ambulatory Visit: Payer: Self-pay | Admitting: Gastroenterology

## 2014-09-05 ENCOUNTER — Other Ambulatory Visit: Payer: Self-pay

## 2014-09-05 MED ORDER — ISOSORBIDE MONONITRATE ER 60 MG PO TB24: 60.0000 mg | ORAL_TABLET | Freq: Every day | ORAL | Status: DC

## 2014-09-05 MED ORDER — BENAZEPRIL HCL 20 MG PO TABS: 20.0000 mg | ORAL_TABLET | Freq: Every day | ORAL | Status: DC

## 2014-09-05 MED ORDER — METOPROLOL SUCCINATE ER 50 MG PO TB24: 50.0000 mg | ORAL_TABLET | Freq: Every day | ORAL | Status: DC

## 2014-10-09 ENCOUNTER — Telehealth: Payer: Self-pay | Admitting: Interventional Cardiology

## 2014-10-09 DIAGNOSIS — I10 Essential (primary) hypertension: Secondary | ICD-10-CM

## 2014-10-09 NOTE — Telephone Encounter (Signed)
New message      Pt c/o BP issue:  1. What are your last 5 BP readings?  180/79, 166/80, 168/85, and 169/80----all today  2. Are you having any other symptoms (ex. Dizziness, headache, blurred vision, passed out)? no 3. What is your medication issue? Should his bp medication be adjusted? Pt is trying to get some dental treatment but the dr will not do it as long as his bp is over 160

## 2014-10-09 NOTE — Telephone Encounter (Signed)
Start HCTZ 12.5 mg daily. Continue other medicines as follows: Benazepril 20 mg daily; metoprolol XL 50 mg daily; isosorbide mononitrate 60 mg daily.  Please draw be met when he returns for office visit on 11/08/13

## 2014-10-09 NOTE — Telephone Encounter (Signed)
Pt st his blood pressure has been running this high for months, but he thought was really only an issue when they would not clean his teeth. He st he is completely asymptomatic.  He st he has been 100% compliant with his medications.  Spoke with Dr. Tamala Julian, who has no new orders at this time.  Encouraged patient to keep 2/11 OV with Dr. Tamala Julian for follow-up.

## 2014-10-10 MED ORDER — HYDROCHLOROTHIAZIDE 12.5 MG PO TABS: 12.5000 mg | ORAL_TABLET | Freq: Every day | ORAL | Status: DC

## 2014-10-10 NOTE — Telephone Encounter (Signed)
Instructed to START HCTZ 12.5 mg daily.  Informed that lab work will be drawn at next [place] [facility].

## 2014-11-08 ENCOUNTER — Encounter: Payer: Self-pay | Admitting: Interventional Cardiology

## 2014-11-08 ENCOUNTER — Ambulatory Visit (INDEPENDENT_AMBULATORY_CARE_PROVIDER_SITE_OTHER): Payer: Medicare HMO | Admitting: Interventional Cardiology

## 2014-11-08 VITALS — BP 132/64 | HR 66 | Ht 69.0 in | Wt 244.0 lb

## 2014-11-08 DIAGNOSIS — I25709 Atherosclerosis of coronary artery bypass graft(s), unspecified, with unspecified angina pectoris: Secondary | ICD-10-CM

## 2014-11-08 DIAGNOSIS — I2510 Atherosclerotic heart disease of native coronary artery without angina pectoris: Secondary | ICD-10-CM

## 2014-11-08 DIAGNOSIS — E785 Hyperlipidemia, unspecified: Secondary | ICD-10-CM

## 2014-11-08 DIAGNOSIS — I452 Bifascicular block: Secondary | ICD-10-CM

## 2014-11-08 DIAGNOSIS — I10 Essential (primary) hypertension: Secondary | ICD-10-CM

## 2014-11-08 MED ORDER — NITROGLYCERIN 0.4 MG SL SUBL: 0.4000 mg | SUBLINGUAL_TABLET | SUBLINGUAL | Status: DC | PRN

## 2014-11-08 NOTE — Patient Instructions (Signed)
Your physician wants you to follow-up in: 9-12 months with Dr. Tamala Julian. You will receive a reminder letter in the mail two months in advance. If you don't receive a letter, please call our office to schedule the follow-up appointment.  Your physician recommends that you continue on your current medications as directed. Please refer to the Current Medication list given to you today.  A refill for nitroglycerin has been sent to the pharmacy for you.

## 2014-11-08 NOTE — Progress Notes (Signed)
Patient ID: Aaron Carlson, male   DOB: 07/27/1933, 81 y.o.   MRN: [id number]    Cardiology Office Note   Date:  11/08/2014   ID:  Aaron Carlson, DOB August 02, 1933, MRN [id number]  PCP:  No primary care provider on file.  Cardiologist:   Sinclair Grooms, MD   No chief complaint on file.     History of Present Illness: Aaron Carlson is a 81 y.o. male who presents for hypertension, coronary artery disease, and conduction abnormality including bifascicular block.    Past Medical History  Diagnosis Date  . Diabetes mellitus   . Hypertension   . Prostate cancer     w/ mets to bladder  . Colon polyps     adenomatous  . Chronic renal insufficiency   . Laryngeal papillomatosis   . Hyperlipemia     Past Surgical History  Procedure Laterality Date  . Bladder surgery    . Prostate surgery    . Polypectomy      vocal cords  . Circumcision  2011     Current Outpatient Prescriptions  Medication Sig Dispense Refill  . aspirin 81 MG tablet Take 81 mg by mouth daily.      Marland Kitchen atorvastatin (LIPITOR) 20 MG tablet Take 20 mg by mouth daily.  0  . BD INSULIN SYRINGE ULTRAFINE 31G X 5/16" 0.5 ML MISC     . benazepril (LOTENSIN) 20 MG tablet Take 1 tablet (20 mg total) by mouth daily. 90 tablet 0  . Calcium Carbonate-Vitamin D (CALCIUM + D PO) Take 1 tablet by mouth.      . fish oil-omega-3 fatty acids 1000 MG capsule Take 1 g by mouth daily.      . Garlic 710 MG TABS Take 1 tablet by mouth.      . hydrochlorothiazide (HYDRODIURIL) 12.5 MG tablet Take 1 tablet (12.5 mg total) by mouth daily. 30 tablet 3  . insulin lispro (HUMALOG) 100 UNIT/ML injection Inject into the skin at bedtime. Sliding scale    . insulin NPH (HUMULIN N,NOVOLIN N) 100 UNIT/ML injection Inject 45 Units into the skin 2 (two) times daily before a meal.     . isosorbide mononitrate (IMDUR) 60 MG 24 hr tablet Take 1 tablet (60 mg total) by mouth daily. 90 tablet 0  . metoprolol succinate (TOPROL-XL) 50 MG 24 hr  tablet Take 1 tablet (50 mg total) by mouth daily. 90 tablet 0  . Multiple Vitamin (MULTIVITAMIN) tablet Take 1 tablet by mouth daily.      . niacin (NIASPAN) 500 MG CR tablet Take 500 mg by mouth at bedtime.    . nitroGLYCERIN (NITROSTAT) 0.4 MG SL tablet Place 1 tablet (0.4 mg total) under the tongue every 5 (five) minutes as needed for chest pain. 25 tablet 3  . ONE TOUCH ULTRA TEST test strip     . OVER THE COUNTER MEDICATION TAKE TWO TABLETS OF RED REISHI DAILY BY MOUTH     No current facility-administered medications for this visit.    Allergies:   Penicillins    Social History:  The patient  reports that he has quit smoking. He has never used smokeless tobacco. He reports that he drinks alcohol. He reports that he does not use illicit drugs.   Family History:  The patient's family history includes Breast cancer in his mother; Diabetes in his mother; Heart disease in his father; Heart failure in his father; Pancreatic cancer in his brother.    ROS:  Please see the history of present illness.   Otherwise, review of systems are positive for none.   All other systems are reviewed and negative.    PHYSICAL EXAM: VS:  BP 132/64 mmHg  Pulse 66  Ht 5\' 9"  (1.753 m)  Wt 244 lb (110.678 kg)  BMI 36.02 kg/m2 , BMI Body mass index is 36.02 kg/(m^2). GEN: Well nourished, well developed, in no acute distress HEENT: normal Neck: no JVD, carotid bruits, or masses Cardiac: RRR; no murmurs, rubs, or gallops,no edema  Respiratory:  clear to auscultation bilaterally, normal work of breathing GI: soft, nontender, nondistended, + BS MS: no deformity or atrophy Skin: warm and dry, no rash Neuro:  Strength and sensation are intact Psych: euthymic mood, full affect   EKG:  EKG is ordered today. The ekg ordered today demonstrates normal sinus rhythm, right bundle branch block, left anterior hemiblock,   Recent Labs: No results found for requested labs within last 365 days.    Lipid Panel     Component Value Date/Time   CHOL 119 08/16/2006 0816   TRIG 101 08/16/2006 0816   HDL 42.4 08/16/2006 0816   CHOLHDL 2.8 CALC 08/16/2006 0816   VLDL 20 08/16/2006 0816   LDLCALC 56 08/16/2006 0816      Wt Readings from Last 3 Encounters:  11/08/14 244 lb (110.678 kg)  11/13/13 247 lb 6.4 oz (112.22 kg)  09/13/13 241 lb (109.317 kg)      Other studies Reviewed: Additional studies/ records that were reviewed today include: . Review of the above records demonstrates:    ASSESSMENT AND PLAN:  1.  Angina versus dyspepsia. Is difficult for me to determine if the chest discomfort was truly GI as the patient feels. He is adamant that an acids quickly relieved the discomfort. My concern is that it is inside of by physical activity and occasionally has rather than a burning sensation, a sense of pressure. 2. Essential hypertension with good control 3. Coronary artery disease with recurring episodes of chest discomfort some of which I feel certain his angina. 4. Conduction system disease with right bundle, left anterior hemiblock, stable   Current medicines are reviewed at length with the patient today.  The patient does not have concerns regarding medicines.  The following changes have been made:  no change  Labs/ tests ordered today include:  No orders of the defined types were placed in this encounter.     Disposition:   FU with Linard Millers in 1 Year   Signed, Sinclair Grooms, MD  11/08/2014 9:42 AM    [address], [place], [place]  [address] Phone: [phone]; Fax: [phone]

## 2014-12-17 ENCOUNTER — Other Ambulatory Visit: Payer: Self-pay | Admitting: *Deleted

## 2014-12-17 MED ORDER — BENAZEPRIL HCL 20 MG PO TABS: 20.0000 mg | ORAL_TABLET | Freq: Every day | ORAL | Status: DC

## 2014-12-17 MED ORDER — ISOSORBIDE MONONITRATE ER 60 MG PO TB24: 60.0000 mg | ORAL_TABLET | Freq: Every day | ORAL | Status: DC

## 2014-12-17 MED ORDER — METOPROLOL SUCCINATE ER 50 MG PO TB24: 50.0000 mg | ORAL_TABLET | Freq: Every day | ORAL | Status: DC

## 2015-02-12 ENCOUNTER — Other Ambulatory Visit: Payer: Self-pay

## 2015-02-12 MED ORDER — HYDROCHLOROTHIAZIDE 12.5 MG PO TABS: 12.5000 mg | ORAL_TABLET | Freq: Every day | ORAL | Status: DC

## 2015-10-02 ENCOUNTER — Other Ambulatory Visit: Payer: Self-pay | Admitting: Interventional Cardiology

## 2015-10-02 MED ORDER — ISOSORBIDE MONONITRATE ER 60 MG PO TB24: 60.0000 mg | ORAL_TABLET | Freq: Every day | ORAL | Status: DC

## 2015-10-02 NOTE — Progress Notes (Signed)
Cardiology Office Note   Date:  10/02/2015   ID:  ZACKAREY KIENLE, DOB Aug 15, 1933, MRN [id number]  PCP:  No primary care provider on file.  Cardiologist:  Sinclair Grooms, MD   Chief Complaint  Patient presents with  . Congestive Heart Failure      History of Present Illness: Aaron Carlson is a 82 y.o. male who presents for diastolic heart failure, diabetes, hypertension, and chest discomfort. He also has chronic renal insufficiency.  He continues to have "indigestion". Certain activities are being avoided because of exertional "indigestion". He has not tried nitroglycerin for the symptoms. He denies orthopnea and PND. He has not had syncope. He is becoming increasingly sedentary.  Past Medical History  Diagnosis Date  . Diabetes mellitus   . Hypertension   . Prostate cancer     w/ mets to bladder  . Colon polyps     adenomatous  . Chronic renal insufficiency   . Laryngeal papillomatosis   . Hyperlipemia     Past Surgical History  Procedure Laterality Date  . Bladder surgery    . Prostate surgery    . Polypectomy      vocal cords  . Circumcision  2011     Current Outpatient Prescriptions  Medication Sig Dispense Refill  . aspirin 81 MG tablet Take 81 mg by mouth daily.      Marland Kitchen atorvastatin (LIPITOR) 20 MG tablet Take 20 mg by mouth daily.  0  . BD INSULIN SYRINGE ULTRAFINE 31G X 5/16" 0.5 ML MISC     . benazepril (LOTENSIN) 20 MG tablet Take 1 tablet (20 mg total) by mouth daily. 90 tablet 2  . Calcium Carbonate-Vitamin D (CALCIUM + D PO) Take 1 tablet by mouth.      . fish oil-omega-3 fatty acids 1000 MG capsule Take 1 g by mouth daily.      . Garlic [id number] MG TABS Take 1 tablet by mouth.      . hydrochlorothiazide (HYDRODIURIL) 12.5 MG tablet Take 1 tablet (12.5 mg total) by mouth daily. 30 tablet 11  . insulin lispro (HUMALOG) 100 UNIT/ML injection Inject into the skin at bedtime. Sliding scale    . insulin NPH (HUMULIN N,NOVOLIN N) 100 UNIT/ML injection  Inject 45 Units into the skin 2 (two) times daily before a meal.     . isosorbide mononitrate (IMDUR) 60 MG 24 hr tablet Take 1 tablet (60 mg total) by mouth daily. 90 tablet 0  . metoprolol succinate (TOPROL-XL) 50 MG 24 hr tablet Take 1 tablet (50 mg total) by mouth daily. 90 tablet 2  . Multiple Vitamin (MULTIVITAMIN) tablet Take 1 tablet by mouth daily.      . niacin (NIASPAN) 500 MG CR tablet Take 500 mg by mouth at bedtime.    . nitroGLYCERIN (NITROSTAT) 0.4 MG SL tablet Place 1 tablet (0.4 mg total) under the tongue every 5 (five) minutes as needed for chest pain. 25 tablet 3  . ONE TOUCH ULTRA TEST test strip     . OVER THE COUNTER MEDICATION TAKE TWO TABLETS OF RED REISHI DAILY BY MOUTH     No current facility-administered medications for this visit.    Allergies:   Penicillins    Social History:  The patient  reports that he has quit smoking. He has never used smokeless tobacco. He reports that he drinks alcohol. He reports that he does not use illicit drugs.   Family History:  The patient's family history includes  Breast cancer in his mother; Diabetes in his mother; Heart disease in his father; Heart failure in his father; Pancreatic cancer in his brother.    ROS:  Please see the history of present illness.   Otherwise, review of systems are positive for dyspnea on exertion, snoring, otherwise no complaints.   All other systems are reviewed and negative.    PHYSICAL EXAM: VS:  There were no vitals taken for this visit. , BMI There is no weight on file to calculate BMI. GEN: Well nourished, well developed, in no acute distress HEENT: normal Neck: no JVD, carotid bruits, or masses Cardiac: RRR.  There is no murmur, rub, or gallop. There is no edema. Respiratory:  clear to auscultation bilaterally, normal work of breathing. GI: soft, nontender, nondistended, + BS MS: no deformity or atrophy Skin: warm and dry, no rash Neuro:  Strength and sensation are intact Psych: euthymic  mood, full affect   EKG:  EKG is ordered today. The ekg reveals normal sinus rhythm, right bundle branch block, left anterior hemiblock, LVH.   Recent Labs: No results found for requested labs within last 365 days.    Lipid Panel    Component Value Date/Time   CHOL 119 08/16/2006 0816   TRIG 101 08/16/2006 0816   HDL 42.4 08/16/2006 0816   CHOLHDL 2.8 CALC 08/16/2006 0816   VLDL 20 08/16/2006 0816   LDLCALC 56 08/16/2006 0816      Wt Readings from Last 3 Encounters:  11/08/14 244 lb (110.678 kg)  11/13/13 247 lb 6.4 oz (112.22 kg)  09/13/13 241 lb (109.317 kg)      Other studies Reviewed: Additional studies/ records that were reviewed today include: None. The findings include none.    ASSESSMENT AND PLAN:  1. Coronary artery disease involving coronary bypass graft of native heart with unspecified angina pectoris I do believe that "" indigestion " is angina. He is limited by this particular problem.  2. Essential hypertension Moderate control  3. RBBB (right bundle branch block with left anterior fascicular block) No change  4. Hyperlipidemia On therapy     Current medicines are reviewed at length with the patient today.  The patient has the following concerns regarding medicines: No side effects.  The following changes/actions have been instituted:    Increase isosorbide to 120 mg daily  Increase metoprolol succinate to 75 mg daily  2 month follow-up  Sublingual nitroglycerin for prolonged episodes of "indigestion"  Labs/ tests ordered today include:  No orders of the defined types were placed in this encounter.     Disposition:   FU with HS in 2 months  Signed, Sinclair Grooms, MD  10/02/2015 6:40 PM    [address], [place], [place]  [address] Phone: [phone]; Fax: [phone]

## 2015-10-03 ENCOUNTER — Ambulatory Visit (INDEPENDENT_AMBULATORY_CARE_PROVIDER_SITE_OTHER): Payer: Medicare PPO | Admitting: Interventional Cardiology

## 2015-10-03 ENCOUNTER — Encounter: Payer: Self-pay | Admitting: Interventional Cardiology

## 2015-10-03 VITALS — BP 140/76 | HR 66 | Ht 69.0 in | Wt 241.1 lb

## 2015-10-03 DIAGNOSIS — E785 Hyperlipidemia, unspecified: Secondary | ICD-10-CM

## 2015-10-03 DIAGNOSIS — I10 Essential (primary) hypertension: Secondary | ICD-10-CM | POA: Diagnosis not present

## 2015-10-03 DIAGNOSIS — I25709 Atherosclerosis of coronary artery bypass graft(s), unspecified, with unspecified angina pectoris: Secondary | ICD-10-CM

## 2015-10-03 DIAGNOSIS — I452 Bifascicular block: Secondary | ICD-10-CM | POA: Diagnosis not present

## 2015-10-03 MED ORDER — NITROGLYCERIN 0.4 MG SL SUBL
0.4000 mg | SUBLINGUAL_TABLET | SUBLINGUAL | Status: DC | PRN
Start: 2015-10-03 — End: 2018-02-02

## 2015-10-03 MED ORDER — ISOSORBIDE MONONITRATE ER 120 MG PO TB24: 120.0000 mg | ORAL_TABLET | Freq: Every day | ORAL | Status: DC

## 2015-10-03 MED ORDER — METOPROLOL SUCCINATE ER 50 MG PO TB24: ORAL_TABLET | ORAL | Status: DC

## 2015-10-03 NOTE — Patient Instructions (Signed)
Medication Instructions:  Your physician has recommended you make the following change in your medication:  1) INCREASE Imdur to 120mg  daily. An Rx has been sent to your pharmacy 2) INCREASE Metoprolol to 1 and 1/2 tablet (75mg ) daily. An Rx has been sent to your pharmacy   Labwork: None ordered  Testing/Procedures: None ordered  Follow-Up: You have a follow up appointment on 12/05/15 @ 10:45am  Any Other Special Instructions Will Be Listed Below (If Applicable). Ok to use Nitro for breakthrough angina "indigestion"     If you need a refill on your cardiac medications before your next appointment, please call your pharmacy.

## 2015-10-08 ENCOUNTER — Telehealth: Payer: Self-pay | Admitting: *Deleted

## 2015-10-08 NOTE — Telephone Encounter (Signed)
Patient left a voicemail on the refill line stating that since his imdur dose was doubled he has been unable to sleep due to having leg cramps every night. Please advise. Thanks, MI

## 2015-10-09 NOTE — Telephone Encounter (Signed)
Spoke who sts that he has been having severe leg cramps at night and has not been able to sleep since Dr.Smith increased Isosorbide to 120mg  daily. Pt sts that he held it Isosorbide on 2 separate days to she if his symptoms resolve and they did, when he resumed symptoms returned. Pt Metoprolol was increased, pt has been taking it with out interruption and feels he is tolerating the Metoprolol increase ok. Pt last dose of Imdur was on 1/9, he was able to sleep well last night, he has not has any breakthrough angina. Adv pt that I will fwd Dr.Smith the update and call back with his recommendation, pt agreeable and verbalized understanding.

## 2015-10-09 NOTE — Telephone Encounter (Signed)
The patient should leave isosorbide off of his medicine list. He needs to inform us if he has increased chest pain without taking the medication.

## 2015-10-10 ENCOUNTER — Other Ambulatory Visit: Payer: Self-pay | Admitting: Interventional Cardiology

## 2015-10-16 ENCOUNTER — Telehealth: Payer: Self-pay | Admitting: Interventional Cardiology

## 2015-10-16 NOTE — Telephone Encounter (Signed)
New message      Pt called approx 1 week ago to let Dr Tamala Julian know that the isosorbide caused severe cramps.  He has stopped taking the medication.  He has not heard back from our office regarding what Dr Tamala Julian said about this.  Please call

## 2015-10-16 NOTE — Telephone Encounter (Signed)
Pt aware of Dr.Smith's response below.   Belva Crome, MD at 10/09/2015 11:11 AM     Status: Signed       Expand All Collapse All   The patient should leave isosorbide off of his medicine list. He needs to inform us if he has increased chest pain without taking the medication.       Pt sts that he is doing well no chest pain, or muscle cramps since stopping Isosorbide. Adv pt to keep [place] updated if he begins to have any breakthrough chest discomfort. Pt verbalized understanding.

## 2015-11-16 ENCOUNTER — Ambulatory Visit (INDEPENDENT_AMBULATORY_CARE_PROVIDER_SITE_OTHER): Payer: Medicare PPO | Admitting: Internal Medicine

## 2015-11-16 VITALS — BP 130/70 | HR 76 | Temp 97.6°F | Resp 14 | Ht 69.0 in | Wt 236.0 lb

## 2015-11-16 DIAGNOSIS — J0100 Acute maxillary sinusitis, unspecified: Secondary | ICD-10-CM | POA: Diagnosis not present

## 2015-11-16 MED ORDER — HYDROCODONE-HOMATROPINE 5-1.5 MG/5ML PO SYRP: 5.0000 mL | ORAL_SOLUTION | Freq: Four times a day (QID) | ORAL | Status: DC | PRN

## 2015-11-16 MED ORDER — CEFDINIR 300 MG PO CAPS: 300.0000 mg | ORAL_CAPSULE | Freq: Two times a day (BID) | ORAL | Status: DC

## 2015-11-16 MED ORDER — FLUTICASONE PROPIONATE 50 MCG/ACT NA SUSP: 2.0000 | Freq: Every day | NASAL | Status: DC

## 2015-11-16 NOTE — Progress Notes (Signed)
   Subjective:    Patient ID: Aaron Carlson, male    DOB: [birth year], 82 y.o.   MRN: [id number] By signing my name below, I, Aaron Carlson, attest that this documentation has been prepared under the direction and in the presence of Aaron Lin, MD.  Electronically Signed: Zola Carlson, Medical Scribe. 11/16/2015. 9:23 AM.  HPI HPI Comments: Aaron Carlson is a 82 y.o. male who presents to the [facility] complaining of gradual onset, worsening cough that started 2 weeks ago, but worsened 2-3 days ago. Patient reports having associated rhinorrhea, nasal congestion, and mild sore throat. He has had difficulty sleeping due to the cough. He believes he may have a sinus infection that he contracted from his great-grandchildren. Patient denies fever and chills.  Patient also notes that he slept in a chair last night and has noticed numbness/tingling in his left 5th finger since then.  Review of Systems  Constitutional: Negative for fever and chills.  HENT: Positive for congestion, rhinorrhea and sore throat.   Respiratory: Positive for cough.   Neurological: Positive for numbness.  tingling L 5th finger after sleeping in chair last pm     Objective:   Physical Exam  Constitutional: He is oriented to person, place, and time. He appears well-developed and well-nourished. No distress.  HENT:  Head: Normocephalic and atraumatic.  Mouth/Throat: Oropharynx is clear and moist. No oropharyngeal exudate.  Purulent discharge of both nostrils.  Eyes: Pupils are equal, round, and reactive to light.  Neck: Neck supple.  Cardiovascular: Normal rate.   Pulmonary/Chest: Effort normal. He has no wheezes.  Clear to auscultation bilaterally.   Musculoskeletal: He exhibits no edema.  Neurological: He is alert and oriented to person, place, and time. No cranial nerve deficit.  Sl decr sens L 5th finger laterally but motor intact  Skin: Skin is warm and dry. No rash noted.    Psychiatric: He has a normal mood and affect. His behavior is normal.  Nursing note and vitals reviewed. BP 130/70 mmHg  Pulse 76  Temp(Src) 97.6 F (36.4 C) (Oral)  Resp 14  Ht 5\' 9"  (1.753 m)  Wt 236 lb (107.049 kg)  BMI 34.84 kg/m2  SpO2 97%         Assessment & Plan:  Acute maxillary sinusitis, recurrence not specified Paresthesia finger due to ulnar pressure overnight-reassured-will resolve over 10d Meds ordered this encounter  Medications  . cefdinir (OMNICEF) 300 MG capsule    Sig: Take 1 capsule (300 mg total) by mouth 2 (two) times daily.    Dispense:  20 capsule    Refill:  0  . fluticasone (FLONASE) 50 MCG/ACT nasal spray    Sig: Place 2 sprays into both nostrils daily. To control allergies    Dispense:  9.9 g    Refill:  5  . HYDROcodone-homatropine (HYCODAN) 5-1.5 MG/5ML syrup    Sig: Take 5 mLs by mouth every 6 (six) hours as needed.    Dispense:  120 mL    Refill:  0    I have completed the patient encounter in its entirety as documented by the scribe, with editing by me where necessary. Aaron Carlson P. Laney Pastor, M.D.

## 2015-12-05 ENCOUNTER — Encounter: Payer: Self-pay | Admitting: Interventional Cardiology

## 2015-12-05 ENCOUNTER — Ambulatory Visit (INDEPENDENT_AMBULATORY_CARE_PROVIDER_SITE_OTHER): Payer: Medicare PPO | Admitting: Interventional Cardiology

## 2015-12-05 VITALS — BP 132/62 | HR 76 | Ht 69.0 in | Wt 234.0 lb

## 2015-12-05 DIAGNOSIS — E785 Hyperlipidemia, unspecified: Secondary | ICD-10-CM | POA: Diagnosis not present

## 2015-12-05 DIAGNOSIS — I452 Bifascicular block: Secondary | ICD-10-CM | POA: Diagnosis not present

## 2015-12-05 DIAGNOSIS — I10 Essential (primary) hypertension: Secondary | ICD-10-CM | POA: Diagnosis not present

## 2015-12-05 DIAGNOSIS — I25709 Atherosclerosis of coronary artery bypass graft(s), unspecified, with unspecified angina pectoris: Secondary | ICD-10-CM

## 2015-12-05 NOTE — Patient Instructions (Signed)
Medication Instructions:  Your physician recommends that you continue on your current medications as directed. Please refer to the Current Medication list given to you today.   Labwork: None ordered  Testing/Procedures: None ordered  Follow-Up: Your physician wants you to follow-up in: 9 months with Dr.Smith You will receive a reminder letter in the mail two months in advance. If you don't receive a letter, please call our office to schedule the follow-up appointment.   Any Other Special Instructions Will Be Listed Below (If Applicable).     If you need a refill on your cardiac medications before your next appointment, please call your pharmacy.   

## 2015-12-05 NOTE — Progress Notes (Signed)
Cardiology Office Note   Date:  12/05/2015   ID:  Aaron Carlson, DOB July 29, 1933, MRN [id number]  PCP:  Gara Kroner, MD  Cardiologist:  Sinclair Grooms, MD   Chief Complaint  Patient presents with  . Chest Pain      History of Present Illness: Aaron Carlson is a 82 y.o. male who presents for CAD, documented diffuse vessel disease of diabetic variety by angiography, essential hypertension, chronic renal insufficiency, and elderly and frail.  Aaron Carlson is doing well. He lives independently. If he ambulates after meals see has mild tightness in the chest a goes right away with rest. He has had no episodes of angina at rest. He has not used nitroglycerin since up titration in his medical regimen.    Past Medical History  Diagnosis Date  . Diabetes mellitus   . Hypertension   . Prostate cancer ([place])     w/ mets to bladder  . Colon polyps     adenomatous  . Chronic renal insufficiency   . Laryngeal papillomatosis   . Hyperlipemia     Past Surgical History  Procedure Laterality Date  . Bladder surgery    . Prostate surgery    . Polypectomy      vocal cords  . Circumcision  2011     Current Outpatient Prescriptions  Medication Sig Dispense Refill  . aspirin 81 MG tablet Take 81 mg by mouth daily.      Marland Kitchen atorvastatin (LIPITOR) 20 MG tablet Take 20 mg by mouth daily.  0  . BD INSULIN SYRINGE ULTRAFINE 31G X 5/16" 0.5 ML MISC     . benazepril (LOTENSIN) 20 MG tablet TAKE ONE TABLET BY MOUTH ONCE DAILY 90 tablet 3  . Calcium Carbonate-Vitamin D (CALCIUM + D PO) Take 1 tablet by mouth.      . fish oil-omega-3 fatty acids 1000 MG capsule Take 1 g by mouth daily. Reported on 11/16/2015    . fluticasone (FLONASE) 50 MCG/ACT nasal spray Place 2 sprays into both nostrils daily. To control allergies 9.9 g 5  . Garlic [id number] MG TABS Take 1 tablet by mouth.      . hydrochlorothiazide (HYDRODIURIL) 12.5 MG tablet Take 1 tablet (12.5 mg total) by mouth daily. 30 tablet 11  .  insulin lispro (HUMALOG) 100 UNIT/ML injection Inject into the skin at bedtime. Sliding scale    . insulin NPH (HUMULIN N,NOVOLIN N) 100 UNIT/ML injection Inject 45 Units into the skin 2 (two) times daily before a meal.     . isosorbide mononitrate (IMDUR) 120 MG 24 hr tablet Take 120 mg by mouth daily.  1  . metoprolol succinate (TOPROL-XL) 50 MG 24 hr tablet Take 1 and 1/2 tablet (75mg ) daily 45 tablet 11  . Multiple Vitamin (MULTIVITAMIN) tablet Take 1 tablet by mouth daily.      . niacin (NIASPAN) 500 MG CR tablet Take 500 mg by mouth at bedtime.    . nitroGLYCERIN (NITROSTAT) 0.4 MG SL tablet Place 1 tablet (0.4 mg total) under the tongue every 5 (five) minutes as needed for chest pain. 25 tablet 3  . ONE TOUCH ULTRA TEST test strip      No current facility-administered medications for this visit.    Allergies:   Penicillins    Social History:  The patient  reports that he has quit smoking. He has never used smokeless tobacco. He reports that he drinks alcohol. He reports that he does not use illicit  drugs.   Family History:  The patient's family history includes Breast cancer in his mother; Diabetes in his mother; Heart disease in his father; Heart failure in his father; Pancreatic cancer in his brother.    ROS:  Please see the history of present illness.   Otherwise, review of systems are positive for Cough and leg pain. Otherwise unremarkable..   All other systems are reviewed and negative.    PHYSICAL EXAM: VS:  BP 132/62 mmHg  Pulse 76  Ht 5\' 9"  (1.753 m)  Wt 234 lb (106.142 kg)  BMI 34.54 kg/m2 , BMI Body mass index is 34.54 kg/(m^2). GEN: Well nourished, well developed, in no acute distressPeriod morbidly obese HEENT: normal Neck: no JVD, carotid bruits, or masses Cardiac: RRR.  There is no murmur, rub, or gallop. There is no edema. Respiratory:  clear to auscultation bilaterally, normal work of breathing. GI: soft, nontender, nondistended, + BS MS: no deformity or  atrophy Skin: warm and dry, no rash Neuro:  Strength and sensation are intact Psych: euthymic mood, full affect   EKG:  EKG is not ordered toda   Recent Labs: No results found for requested labs within last 365 days.    Lipid Panel    Component Value Date/Time   CHOL 119 08/16/2006 0816   TRIG 101 08/16/2006 0816   HDL 42.4 08/16/2006 0816   CHOLHDL 2.8 CALC 08/16/2006 0816   VLDL 20 08/16/2006 0816   LDLCALC 56 08/16/2006 0816      Wt Readings from Last 3 Encounters:  12/05/15 234 lb (106.142 kg)  11/16/15 236 lb (107.049 kg)  10/03/15 241 lb 1.9 oz (109.371 kg)      Other studies Reviewed: Additional studies/ records that were reviewed today include: None. The findings include none.    ASSESSMENT AND PLAN:  1. Coronary artery disease involving coronary bypass graft of native heart with unspecified angina pectoris Class II angina on good medical regimen  2. Essential hypertension Well controlled  3. RBBB (right bundle branch block with left anterior fascicular block) Not reassessed  4. Hyperlipidemia Not reassessed    Current medicines are reviewed at length with the patient today.  The patient has the following concerns regarding medicines: None.  The following changes/actions have been instituted:    No changes in medical therapy.  Cautioned to notify us if increasing anginal intensity or nitroglycerin requirement  Labs/ tests ordered today include:  No orders of the defined types were placed in this encounter.     Disposition:   FU with HS in 1 year  Signed, Sinclair Grooms, MD  12/05/2015 11:29 AM    [address], [place], [place]  [address] Phone: [phone]; Fax: [phone]

## 2015-12-16 ENCOUNTER — Ambulatory Visit (INDEPENDENT_AMBULATORY_CARE_PROVIDER_SITE_OTHER): Payer: Medicare PPO

## 2015-12-16 ENCOUNTER — Ambulatory Visit (INDEPENDENT_AMBULATORY_CARE_PROVIDER_SITE_OTHER): Payer: Medicare PPO | Admitting: Emergency Medicine

## 2015-12-16 VITALS — BP 128/68 | HR 60 | Temp 98.8°F | Resp 16 | Ht 68.0 in | Wt 234.0 lb

## 2015-12-16 DIAGNOSIS — H6992 Unspecified Eustachian tube disorder, left ear: Secondary | ICD-10-CM

## 2015-12-16 DIAGNOSIS — J301 Allergic rhinitis due to pollen: Secondary | ICD-10-CM

## 2015-12-16 DIAGNOSIS — J0100 Acute maxillary sinusitis, unspecified: Secondary | ICD-10-CM

## 2015-12-16 DIAGNOSIS — H6982 Other specified disorders of Eustachian tube, left ear: Secondary | ICD-10-CM | POA: Diagnosis not present

## 2015-12-16 DIAGNOSIS — J0140 Acute pansinusitis, unspecified: Secondary | ICD-10-CM | POA: Diagnosis not present

## 2015-12-16 MED ORDER — AZELASTINE HCL 0.1 % NA SOLN: 2.0000 | Freq: Two times a day (BID) | NASAL | Status: DC

## 2015-12-16 MED ORDER — DOXYCYCLINE HYCLATE 100 MG PO TABS: 100.0000 mg | ORAL_TABLET | Freq: Two times a day (BID) | ORAL | Status: DC

## 2015-12-16 NOTE — Patient Instructions (Addendum)
   IF you received an x-ray today, you will receive an invoice from [facility]. Please contact [facility] at [phone] with questions or concerns regarding your invoice.   IF you received labwork today, you will receive an invoice from [facility]. Please contact [facility] at [phone] with questions or concerns regarding your invoice.   Our billing staff will not be able to assist you with questions regarding bills from these companies.  You will be contacted with the lab results as soon as they are available. The fastest way to get your results is to activate your My Chart account. Instructions are located on the last page of this paperwork. If you have not heard from us regarding the results in 2 weeks, please contact this office.     Sinusitis, Adult Sinusitis is redness, soreness, and inflammation of the paranasal sinuses. Paranasal sinuses are air pockets within the bones of your face. They are located beneath your eyes, in the middle of your forehead, and above your eyes. In healthy paranasal sinuses, mucus is able to drain out, and air is able to circulate through them by way of your nose. However, when your paranasal sinuses are inflamed, mucus and air can become trapped. This can allow bacteria and other germs to grow and cause infection. Sinusitis can develop quickly and last only a short time (acute) or continue over a long period (chronic). Sinusitis that lasts for more than 12 weeks is considered chronic. CAUSES Causes of sinusitis include:  Allergies.  Structural abnormalities, such as displacement of the cartilage that separates your nostrils (deviated septum), which can decrease the air flow through your nose and sinuses and affect sinus drainage.  Functional abnormalities, such as when the small hairs (cilia) that line your sinuses and help remove mucus do not work properly or are not present. SIGNS AND SYMPTOMS Symptoms  of acute and chronic sinusitis are the same. The primary symptoms are pain and pressure around the affected sinuses. Other symptoms include:  Upper toothache.  Earache.  Headache.  Bad breath.  Decreased sense of smell and taste.  A cough, which worsens when you are lying flat.  Fatigue.  Fever.  Thick drainage from your nose, which often is green and may contain pus (purulent).  Swelling and warmth over the affected sinuses. DIAGNOSIS Your health care provider will perform a physical exam. During your exam, your health care provider may perform any of the following to help determine if you have acute sinusitis or chronic sinusitis:  Look in your nose for signs of abnormal growths in your nostrils (nasal polyps).  Tap over the affected sinus to check for signs of infection.  View the inside of your sinuses using an imaging device that has a light attached (endoscope). If your health care provider suspects that you have chronic sinusitis, one or more of the following tests may be recommended:  Allergy tests.  Nasal culture. A sample of mucus is taken from your nose, sent to a lab, and screened for bacteria.  Nasal cytology. A sample of mucus is taken from your nose and examined by your health care provider to determine if your sinusitis is related to an allergy. TREATMENT Most cases of acute sinusitis are related to a viral infection and will resolve on their own within 10 days. Sometimes, medicines are prescribed to help relieve symptoms of both acute and chronic sinusitis. These may include pain medicines, decongestants, nasal steroid sprays, or saline sprays. However, for sinusitis related   to a bacterial infection, your health care provider will prescribe antibiotic medicines. These are medicines that will help kill the bacteria causing the infection. Rarely, sinusitis is caused by a fungal infection. In these cases, your health care provider will prescribe antifungal  medicine. For some cases of chronic sinusitis, surgery is needed. Generally, these are cases in which sinusitis recurs more than 3 times per year, despite other treatments. HOME CARE INSTRUCTIONS  Drink plenty of water. Water helps thin the mucus so your sinuses can drain more easily.  Use a humidifier.  Inhale steam 3-4 times a day (for example, sit in the bathroom with the shower running).  Apply a warm, moist washcloth to your face 3-4 times a day, or as directed by your health care provider.  Use saline nasal sprays to help moisten and clean your sinuses.  Take medicines only as directed by your health care provider.  If you were prescribed either an antibiotic or antifungal medicine, finish it all even if you start to feel better. SEEK IMMEDIATE MEDICAL CARE IF:  You have increasing pain or severe headaches.  You have nausea, vomiting, or drowsiness.  You have swelling around your face.  You have vision problems.  You have a stiff neck.  You have difficulty breathing.   This information is not intended to replace advice given to you by your health care provider. Make sure you discuss any questions you have with your health care provider.   Document Released: 09/14/2005 Document Revised: 10/05/2014 Document Reviewed: 09/29/2011 Elsevier Interactive Patient Education 2016 [employer]vier Inc.  

## 2015-12-16 NOTE — Progress Notes (Addendum)
Patient ID: Aaron Carlson, male   DOB: [birth year], 82 y.o.   MRN: [id number]    By signing my name below, I, Aaron Carlson, attest that this documentation has been prepared under the direction and in the presence of [name], MD Electronically Signed: [name], ED Scribe 12/16/2015 at 10:31 AM.  Chief Complaint:  Chief Complaint  Patient presents with  . Ear Fullness    left, x 4 days   . Nasal Congestion    x 1 month    HPI: Aaron Carlson is a 82 y.o. male who reports to [facility] today complaining of persistent nasal congestion for the past month. Pt reports associated green colored rhinorrhea, postnasal drip and left ear fullness onset 4 days ago. Pt has tried Flonase, cough syrup, allergy pills and Tylenol without significant relief. He reports h/o environmental allergies but states that this is the most severe that it has ever been. He denies new pets.   Past Medical History  Diagnosis Date  . Diabetes mellitus   . Hypertension   . Prostate cancer ([place])     w/ mets to bladder  . Colon polyps     adenomatous  . Chronic renal insufficiency   . Laryngeal papillomatosis   . Hyperlipemia    Past Surgical History  Procedure Laterality Date  . Bladder surgery    . Prostate surgery    . Polypectomy      vocal cords  . Circumcision  2011   Social History   Social History  . Marital Status: Married    Spouse Name: N/A  . Number of Children: 4  . Years of Education: N/A   Occupational History  . Retired    Social History Main Topics  . Smoking status: Former Research scientist (life sciences)  . Smokeless tobacco: Never Used  . Alcohol Use: Yes     Comment: social  . Drug Use: No  . Sexual Activity: Not Asked   Other Topics Concern  . None   Social History Narrative   Family History  Problem Relation Age of Onset  . Breast cancer Mother   . Diabetes Mother   . Heart failure Father   . Heart disease Father   . Pancreatic cancer Brother    Allergies  Allergen Reactions  .  Penicillins Rash   Prior to Admission medications   Medication Sig Start Date End Date Taking? Authorizing Provider  aspirin 81 MG tablet Take 81 mg by mouth daily.     Yes Historical Provider, MD  atorvastatin (LIPITOR) 20 MG tablet Take 20 mg by mouth daily. 10/05/14  Yes Historical Provider, MD  BD INSULIN SYRINGE ULTRAFINE 31G X 5/16" 0.5 ML MISC  10/10/13  Yes Historical Provider, MD  benazepril (LOTENSIN) 20 MG tablet TAKE ONE TABLET BY MOUTH ONCE DAILY 10/10/15  Yes Belva Crome, MD  Calcium Carbonate-Vitamin D (CALCIUM + D PO) Take 1 tablet by mouth.     Yes Historical Provider, MD  fish oil-omega-3 fatty acids 1000 MG capsule Take 1 g by mouth daily. Reported on 11/16/2015   Yes Historical Provider, MD  fluticasone (FLONASE) 50 MCG/ACT nasal spray Place 2 sprays into both nostrils daily. To control allergies 11/16/15  Yes Leandrew Koyanagi, MD  Garlic [id number] MG TABS Take 1 tablet by mouth.     Yes Historical Provider, MD  hydrochlorothiazide (HYDRODIURIL) 12.5 MG tablet Take 1 tablet (12.5 mg total) by mouth daily. 02/12/15  Yes Belva Crome, MD  insulin lispro (  HUMALOG) 100 UNIT/ML injection Inject into the skin at bedtime. Sliding scale   Yes Historical Provider, MD  insulin NPH (HUMULIN N,NOVOLIN N) 100 UNIT/ML injection Inject 45 Units into the skin 2 (two) times daily before a meal.    Yes Historical Provider, MD  isosorbide mononitrate (IMDUR) 120 MG 24 hr tablet Take 120 mg by mouth daily. 10/03/15  Yes Historical Provider, MD  metoprolol succinate (TOPROL-XL) 50 MG 24 hr tablet Take 1 and 1/2 tablet (75mg ) daily 10/03/15  Yes Belva Crome, MD  Multiple Vitamin (MULTIVITAMIN) tablet Take 1 tablet by mouth daily.     Yes Historical Provider, MD  niacin (NIASPAN) 500 MG CR tablet Take 500 mg by mouth at bedtime.   Yes Historical Provider, MD  nitroGLYCERIN (NITROSTAT) 0.4 MG SL tablet Place 1 tablet (0.4 mg total) under the tongue every 5 (five) minutes as needed for chest pain. 10/03/15  Yes  Belva Crome, MD  ONE TOUCH ULTRA TEST test strip  08/21/13  Yes Historical Provider, MD   ROS: The patient denies fevers, chills, night sweats, unintentional weight loss, chest pain, palpitations, wheezing, dyspnea on exertion, nausea, vomiting, abdominal pain, dysuria, hematuria, melena, numbness, weakness, or tingling. +congestion, +postnasal drip, +rhinorrhea   All other systems have been reviewed and were otherwise negative with the exception of those mentioned in the HPI and as above.    PHYSICAL EXAM: Filed Vitals:   12/16/15 0932  BP: 128/68  Pulse: 60  Temp: 98.8 F (37.1 C)  Resp: 16   Body mass index is 35.59 kg/(m^2).  General: Alert, no acute distress HEENT:  Normocephalic, atraumatic, oropharynx patent. Fluid behind the L TM. Nasal turbinates are blue discolored and swollen.  Eye: Juliette Mangle [facility] Cardiovascular: Regular rate and rhythm, no rubs murmurs or gallops. No Carotid bruits, radial pulse intact. No pedal edema.  Respiratory: Clear to auscultation bilaterally. No wheezes, rales, or rhonchi. No cyanosis, no use of accessory musculature Abdominal: No organomegaly, abdomen is soft and non-tender, positive bowel sounds. No masses. Musculoskeletal: Gait intact. No edema, tenderness Skin: No rashes. Neurologic: Facial musculature symmetric. Psychiatric: Patient acts appropriately throughout our interaction. Lymphatic: No cervical or submandibular lymphadenopathy  LABS:  EKG/XRAY:   Primary read interpreted by Dr. Everlene Farrier at [facility].  Dg Sinus 1-2 Views  12/16/2015  CLINICAL DATA:  Sinus pain and pressure. EXAM: PARANASAL SINUSES - 1-2 VIEW COMPARISON:  No prior. FINDINGS: Air-filled levels of the right maxillary sinus. Near complete opacification left maxillary sinus noted. Mucosal thickening and ethmoidal sinuses cannot be excluded. Mild mucosal thickening of frontal sinus cannot be excluded. Mastoids are clear. No acute bony abnormality . IMPRESSION: 1. Air-fluid level  in the right maxillary sinus. Near complete opacification left maxillary sinus. Findings consist with bilateral maxillary sinusitis. 2. Cannot exclude mucosal thickening in the ethmoidal and frontal sinuses. Electronically Signed   By: Marcello Moores  Register   On: 12/16/2015 10:43    ASSESSMENT/PLAN: Patient Re: Took 10 days of Omnicef without improvement. Will treat with doxycycline for 10 days. Follow-up made with ENT. He was also given Astepro nasal spray.I personally performed the services described in this documentation, which was scribed in my presence. The recorded information has been reviewed and is accurate.    Gross sideeffects, risk and benefits, and alternatives of medications d/w patient. Patient is aware that all medications have potential sideeffects and we are unable to predict every sideeffect or drug-drug interaction that may occur.  Arlyss Queen MD 12/16/2015 10:25 AM

## 2015-12-16 NOTE — Addendum Note (Signed)
Addended by: Arlyss Queen A on: 12/16/2015 11:10 AM   Modules accepted: Orders

## 2015-12-17 ENCOUNTER — Telehealth: Payer: Self-pay

## 2015-12-17 NOTE — Telephone Encounter (Signed)
Please call patient with the results of his head x-ray.   Dr. Everlene Farrier patient   [phone] (H)

## 2015-12-18 NOTE — Telephone Encounter (Signed)
Left message for pt to call back  °

## 2015-12-18 NOTE — Telephone Encounter (Signed)
X-rays done of the sinus areas showed severe bilateral maxillary sinusitis and evidence of sinus infection in the other sinus cavities. When is his appointment with ENT?

## 2015-12-20 ENCOUNTER — Telehealth: Payer: Self-pay

## 2015-12-20 NOTE — Telephone Encounter (Signed)
IC pt - advised CD with xray of sinuses is at front desk for pick up. Pt agreed.

## 2015-12-20 NOTE — Telephone Encounter (Signed)
March 31st at [place] we burn a CD for him to take.

## 2016-02-07 NOTE — Telephone Encounter (Signed)
error 

## 2016-03-09 ENCOUNTER — Other Ambulatory Visit: Payer: Self-pay | Admitting: Interventional Cardiology

## 2016-04-15 ENCOUNTER — Telehealth: Payer: Self-pay | Admitting: Interventional Cardiology

## 2016-04-15 NOTE — Telephone Encounter (Signed)
New message     Pt said he dropped off x-rays on 07/13 for Aaron Carlson to examine and would like feedback based on the x-rays. Please advise.

## 2016-04-15 NOTE — Telephone Encounter (Signed)
Returned pt call. Adv pt that we have received a copy of his xrays. Dr.Smith has not been in the office. We will call back once Dr.Smith has the chance to review it. Pt verbalized understanding.

## 2016-04-16 ENCOUNTER — Telehealth: Payer: Self-pay

## 2016-04-16 ENCOUNTER — Other Ambulatory Visit: Payer: Self-pay

## 2016-04-16 DIAGNOSIS — I700 Atherosclerosis of aorta: Secondary | ICD-10-CM

## 2016-04-16 NOTE — Telephone Encounter (Signed)
Pt aware of Dr.Smith's recommendations. After reviewing pt's chest xray and CD performed at an outside location he is recommending pt have a AAA duplex to r/o AAA. Pt is aware and agreeable to having the test performed. Adv pt that I will fwd a message to our scheduling dept to call him to schedule. Pt voiced appreciation for the call and verbalized understanding.

## 2016-04-16 NOTE — Telephone Encounter (Signed)
Follow-up ° ° ° ° °The wife is returning the nurses phone call °

## 2016-04-16 NOTE — Telephone Encounter (Signed)
Spoke with pt's wife adv her that it may have been a scheduler calling to schedule the pt's AAA duplex. I will fwd them a message to attempt to call them again.

## 2016-04-24 ENCOUNTER — Ambulatory Visit (HOSPITAL_COMMUNITY)
Admission: RE | Admit: 2016-04-24 | Discharge: 2016-04-24 | Disposition: A | Discharge status: Home / Self Care | Payer: Medicare PPO | Source: Ambulatory Visit | Attending: Cardiology | Admitting: Cardiology

## 2016-04-24 DIAGNOSIS — E1122 Type 2 diabetes mellitus with diabetic chronic kidney disease: Secondary | ICD-10-CM | POA: Diagnosis not present

## 2016-04-24 DIAGNOSIS — I129 Hypertensive chronic kidney disease with stage 1 through stage 4 chronic kidney disease, or unspecified chronic kidney disease: Secondary | ICD-10-CM | POA: Diagnosis not present

## 2016-04-24 DIAGNOSIS — I700 Atherosclerosis of aorta: Secondary | ICD-10-CM | POA: Insufficient documentation

## 2016-04-24 DIAGNOSIS — I708 Atherosclerosis of other arteries: Secondary | ICD-10-CM | POA: Insufficient documentation

## 2016-04-24 DIAGNOSIS — N189 Chronic kidney disease, unspecified: Secondary | ICD-10-CM | POA: Insufficient documentation

## 2016-04-24 DIAGNOSIS — I723 Aneurysm of iliac artery: Secondary | ICD-10-CM | POA: Insufficient documentation

## 2016-04-24 DIAGNOSIS — I714 Abdominal aortic aneurysm, without rupture: Secondary | ICD-10-CM | POA: Diagnosis not present

## 2016-04-24 DIAGNOSIS — E785 Hyperlipidemia, unspecified: Secondary | ICD-10-CM | POA: Insufficient documentation

## 2016-04-27 ENCOUNTER — Other Ambulatory Visit: Payer: Self-pay

## 2016-04-27 DIAGNOSIS — I714 Abdominal aortic aneurysm, without rupture, unspecified: Secondary | ICD-10-CM

## 2016-08-06 ENCOUNTER — Encounter: Payer: Self-pay | Admitting: Interventional Cardiology

## 2016-08-11 ENCOUNTER — Encounter: Payer: Self-pay | Admitting: Interventional Cardiology

## 2016-08-11 ENCOUNTER — Other Ambulatory Visit: Payer: Self-pay | Admitting: Nephrology

## 2016-08-11 DIAGNOSIS — N183 Chronic kidney disease, stage 3 unspecified: Secondary | ICD-10-CM | POA: Insufficient documentation

## 2016-08-17 ENCOUNTER — Ambulatory Visit
Admission: RE | Admit: 2016-08-17 | Discharge: 2016-08-17 | Disposition: A | Discharge status: Home / Self Care | Payer: Medicare PPO | Source: Ambulatory Visit | Attending: Nephrology | Admitting: Nephrology

## 2016-08-17 DIAGNOSIS — N183 Chronic kidney disease, stage 3 unspecified: Secondary | ICD-10-CM

## 2016-09-09 ENCOUNTER — Ambulatory Visit (INDEPENDENT_AMBULATORY_CARE_PROVIDER_SITE_OTHER): Payer: Medicare PPO | Admitting: Interventional Cardiology

## 2016-09-09 ENCOUNTER — Encounter: Payer: Self-pay | Admitting: Interventional Cardiology

## 2016-09-09 VITALS — BP 130/68 | HR 68 | Ht 69.0 in | Wt 239.0 lb

## 2016-09-09 DIAGNOSIS — N183 Chronic kidney disease, stage 3 unspecified: Secondary | ICD-10-CM

## 2016-09-09 DIAGNOSIS — E784 Other hyperlipidemia: Secondary | ICD-10-CM

## 2016-09-09 DIAGNOSIS — R0683 Snoring: Secondary | ICD-10-CM

## 2016-09-09 DIAGNOSIS — I452 Bifascicular block: Secondary | ICD-10-CM | POA: Diagnosis not present

## 2016-09-09 DIAGNOSIS — I5032 Chronic diastolic (congestive) heart failure: Secondary | ICD-10-CM

## 2016-09-09 DIAGNOSIS — I2510 Atherosclerotic heart disease of native coronary artery without angina pectoris: Secondary | ICD-10-CM

## 2016-09-09 DIAGNOSIS — I10 Essential (primary) hypertension: Secondary | ICD-10-CM

## 2016-09-09 DIAGNOSIS — E7849 Other hyperlipidemia: Secondary | ICD-10-CM

## 2016-09-09 MED ORDER — FUROSEMIDE 40 MG PO TABS: 40.0000 mg | ORAL_TABLET | Freq: Every day | ORAL | 3 refills | Status: DC

## 2016-09-09 NOTE — Progress Notes (Signed)
Cardiology Office Note    Date:  09/09/2016   ID:  Aaron Carlson, DOB [birth year], MRN [id number]  PCP:  Gara Kroner, MD  Cardiologist: Sinclair Grooms, MD   Chief Complaint  Patient presents with  . Coronary Artery Disease    History of Present Illness:  Aaron Carlson is a 82 y.o. male who presents for CAD, documented diffuse vessel disease of diabetic variety by angiography, essential hypertension, chronic renal insufficiency, and elderly and frail.  Aaron Carlson is still limited by moderate physical activity such as carrying his trash cans to the curb. He gets short of breath and has some chest tightness. This is worse when he is postprandial. He denies orthopnea, PND, chest discomfort at rest, orthopnea, and lower extremity swelling. He has not used any nitroglycerin.  Past Medical History:  Diagnosis Date  . Chronic renal insufficiency   . Colon polyps    adenomatous  . Diabetes mellitus   . Hyperlipemia   . Hypertension   . Laryngeal papillomatosis   . Prostate cancer ([place])    w/ mets to bladder    Past Surgical History:  Procedure Laterality Date  . BLADDER SURGERY    . CIRCUMCISION  2011  . POLYPECTOMY     vocal cords  . PROSTATE SURGERY      Current Medications: Outpatient Medications Prior to Visit  Medication Sig Dispense Refill  . aspirin 81 MG tablet Take 81 mg by mouth daily.      Marland Kitchen atorvastatin (LIPITOR) 20 MG tablet Take 20 mg by mouth daily.  0  . azelastine (ASTELIN) 0.1 % nasal spray Place 2 sprays into both nostrils 2 (two) times daily. Use in each nostril as directed 30 mL 12  . BD INSULIN SYRINGE ULTRAFINE 31G X 5/16" 0.5 ML MISC     . benazepril (LOTENSIN) 20 MG tablet TAKE ONE TABLET BY MOUTH ONCE DAILY 90 tablet 3  . Calcium Carbonate-Vitamin D (CALCIUM + D PO) Take 1 tablet by mouth daily.     . fish oil-omega-3 fatty acids 1000 MG capsule Take 1 g by mouth daily. Reported on 11/16/2015    . fluticasone (FLONASE) 50 MCG/ACT nasal  spray Place 2 sprays into both nostrils daily. To control allergies 9.9 g 5  . Garlic XX123456 MG TABS Take 1 tablet by mouth daily.     . insulin lispro (HUMALOG) 100 UNIT/ML injection Inject into the skin at bedtime. Sliding scale    . Multiple Vitamin (MULTIVITAMIN) tablet Take 1 tablet by mouth daily.      . niacin (NIASPAN) 500 MG CR tablet Take 500 mg by mouth at bedtime.    . nitroGLYCERIN (NITROSTAT) 0.4 MG SL tablet Place 1 tablet (0.4 mg total) under the tongue every 5 (five) minutes as needed for chest pain. 25 tablet 3  . ONE TOUCH ULTRA TEST test strip     . hydrochlorothiazide (HYDRODIURIL) 12.5 MG tablet TAKE ONE TABLET BY MOUTH ONCE DAILY 30 tablet 8  . doxycycline (VIBRA-TABS) 100 MG tablet Take 1 tablet (100 mg total) by mouth 2 (two) times daily. 20 tablet 0  . insulin NPH (HUMULIN N,NOVOLIN N) 100 UNIT/ML injection Inject 45 Units into the skin 2 (two) times daily before a meal.     . isosorbide mononitrate (IMDUR) 120 MG 24 hr tablet Take 120 mg by mouth daily.  1  . metoprolol succinate (TOPROL-XL) 50 MG 24 hr tablet Take 1 and 1/2 tablet (75mg ) daily 45 tablet  11   No facility-administered medications prior to visit.      Allergies:   Penicillins   Social History   Social History  . Marital status: Married    Spouse name: N/A  . Number of children: 4  . Years of education: N/A   Occupational History  . Retired    Social History Main Topics  . Smoking status: Former Research scientist (life sciences)  . Smokeless tobacco: Never Used  . Alcohol use Yes     Comment: social  . Drug use: No  . Sexual activity: Not Asked   Other Topics Concern  . None   Social History Narrative  . None     Family History:  The patient's family history includes Breast cancer in his mother; Diabetes in his mother; Heart disease in his father; Heart failure in his father; Pancreatic cancer in his brother.   ROS:   Please see the history of present illness.    He has occasional cough, snoring, and  fatigue.  All other systems reviewed and are negative.   PHYSICAL EXAM:   VS:  BP 130/68 (BP Location: Right Arm)   Pulse 68   Ht 5\' 9"  (1.753 m)   Wt 239 lb (108.4 kg)   BMI 35.29 kg/m    GEN: Well nourished, well developed, in no acute distress . Obese. HEENT: normal  Neck: There is 2 cm of JVD. No carotid bruits, or masses are noted. Cardiac: RRR; no murmurs, rubs, or gallops. There is 1+ bilateral ankle edema. Respiratory:  clear to auscultation bilaterally, normal work of breathing GI: soft, nontender, nondistended, + BS MS: no deformity or atrophy  Skin: warm and dry, no rash Neuro:  Alert and Oriented x 3, Strength and sensation are intact Psych: euthymic mood, full affect  Wt Readings from Last 3 Encounters:  09/09/16 239 lb (108.4 kg)  12/16/15 234 lb (106.1 kg)  12/05/15 234 lb (106.1 kg)      Studies/Labs Reviewed:   EKG:  EKG  Normal sinus rhythm with right bundle, left anterior hemiblock, and normal PR interval at 170 ms.  Recent Labs: No results found for requested labs within last 8760 hours.   Lipid Panel    Component Value Date/Time   CHOL 119 08/16/2006 0816   TRIG 101 08/16/2006 0816   HDL 42.4 08/16/2006 0816   CHOLHDL 2.8 CALC 08/16/2006 0816   VLDL 20 08/16/2006 0816   LDLCALC 56 08/16/2006 0816    Additional studies/ records that were reviewed today include:  No new data    ASSESSMENT:    1. CAD in native artery   2. Chronic diastolic heart failure ([place])   3. Essential hypertension   4. Snoring   5. Other hyperlipidemia   6. RBBB (right bundle branch block with left anterior fascicular block)   7. CKD (chronic kidney disease) stage 3, GFR 30-59 ml/min      PLAN:  In order of problems listed above:  1. Exertional dyspnea with some tightness in the chest. He does not use nitroglycerin. He has no chest discomfort at rest. He has diffuse coronary disease. 2. For the chronic diastolic heart failure with volume overload, will change  from hydrochlorothiazide to furosemide 40 mg per day. He will need 3-4 week follow-up with APP to assess volume status and kidney function. We will do a basic metabolic panel in 0000000 days after the switch. 3. The blood pressure is under good control. 2 g sodium diet as advocated. 4. Snoring is  present. He does have lower extremity edema and elevated neck veins. Could have pulmonary hypertension related to sleep apnea. A sleep study needs to be done.    Medication Adjustments/Labs and Tests Ordered: Current medicines are reviewed at length with the patient today.  Concerns regarding medicines are outlined above.  Medication changes, Labs and Tests ordered today are listed in the Patient Instructions below. Patient Instructions  Medication Instructions:  1) DISCONTINUE Hydrochlorothiazide. 2) START Furosemide 40mg  once daily.   Labwork: Your physician recommends that you return for lab work in: 7-10 days (BMET)   Testing/Procedures: Your physician has recommended that you have a sleep study. This test records several body functions during sleep, including: brain activity, eye movement, oxygen and carbon dioxide blood levels, heart rate and rhythm, breathing rate and rhythm, the flow of air through your mouth and nose, snoring, body muscle movements, and chest and belly movement.   Follow-Up: Your physician recommends that you schedule a follow-up appointment in: 3-4 weeks with a PA or NP to follow up on breathing.  Your physician wants you to follow-up in: 6-9 months with Dr. Tamala Julian.  You will receive a reminder letter in the mail two months in advance. If you don't receive a letter, please call our office to schedule the follow-up appointment.    Any Other Special Instructions Will Be Listed Below (If Applicable).     If you need a refill on your cardiac medications before your next appointment, please call your pharmacy.      Signed, Sinclair Grooms, MD  09/09/2016 1:13 PM      [facility] [address], [place], [place]  [address] Phone: [phone]; Fax: [phone]

## 2016-09-09 NOTE — Patient Instructions (Addendum)
Medication Instructions:  1) DISCONTINUE Hydrochlorothiazide. 2) START Furosemide 40mg  once daily.   Labwork: Your physician recommends that you return for lab work in: 7-10 days (BMET)   Testing/Procedures: Your physician has recommended that you have a sleep study. This test records several body functions during sleep, including: brain activity, eye movement, oxygen and carbon dioxide blood levels, heart rate and rhythm, breathing rate and rhythm, the flow of air through your mouth and nose, snoring, body muscle movements, and chest and belly movement.   Follow-Up: Your physician recommends that you schedule a follow-up appointment in: 3-4 weeks with a PA or NP to follow up on breathing.  Your physician wants you to follow-up in: 6-9 months with Dr. Tamala Julian.  You will receive a reminder letter in the mail two months in advance. If you don't receive a letter, please call our office to schedule the follow-up appointment.    Any Other Special Instructions Will Be Listed Below (If Applicable).     If you need a refill on your cardiac medications before your next appointment, please call your pharmacy.

## 2016-09-16 ENCOUNTER — Other Ambulatory Visit: Payer: Medicare PPO | Admitting: *Deleted

## 2016-09-16 ENCOUNTER — Telehealth: Payer: Self-pay | Admitting: *Deleted

## 2016-09-16 DIAGNOSIS — I10 Essential (primary) hypertension: Secondary | ICD-10-CM

## 2016-09-16 DIAGNOSIS — I2510 Atherosclerotic heart disease of native coronary artery without angina pectoris: Secondary | ICD-10-CM

## 2016-09-16 LAB — BASIC METABOLIC PANEL
BUN: 46 mg/dL — AB (ref 7–25)
CALCIUM: 8.8 mg/dL (ref 8.6–10.3)
CO2: 18 mmol/L — AB (ref 20–31)
CREATININE: 2.47 mg/dL — AB (ref 0.70–1.11)
Chloride: 111 mmol/L — ABNORMAL HIGH (ref 98–110)
Glucose, Bld: 122 mg/dL — ABNORMAL HIGH (ref 65–99)
Potassium: 3.8 mmol/L (ref 3.5–5.3)
Sodium: 140 mmol/L (ref 135–146)

## 2016-09-16 MED ORDER — FUROSEMIDE 40 MG PO TABS: 20.0000 mg | ORAL_TABLET | Freq: Every day | ORAL | 3 refills | Status: DC

## 2016-09-16 NOTE — Telephone Encounter (Signed)
Spoke with pt and informed him of lab results and new orders.  Pt in agreement with plan. Will plan repeat labs on 09/29/16. Pt states that he has not really done anything to exert himself, so he is unsure if his breathing has improved any.  Advised pt when he does do something to exert himself, to call me if no improvement.  Pt agreement with this plan.

## 2016-09-16 NOTE — Telephone Encounter (Signed)
-----   Message from Belva Crome, MD sent at 09/16/2016  5:16 PM EST ----- Let the patient know stop furosemide for 3 days then resume at 20 mg per day. Repeat basic metabolic panel 7 days after resuming furosemide. Find out if his breathing is better since he has been on furosemide? A copy will be sent to Gara Kroner, MD

## 2016-09-29 ENCOUNTER — Other Ambulatory Visit: Payer: Medicare PPO

## 2016-09-30 ENCOUNTER — Other Ambulatory Visit: Payer: Medicare PPO | Admitting: *Deleted

## 2016-09-30 DIAGNOSIS — I10 Essential (primary) hypertension: Secondary | ICD-10-CM

## 2016-10-01 LAB — BASIC METABOLIC PANEL
BUN/Creatinine Ratio: 20 (ref 10–24)
BUN: 38 mg/dL — AB (ref 8–27)
CALCIUM: 9.5 mg/dL (ref 8.6–10.2)
CHLORIDE: 104 mmol/L (ref 96–106)
CO2: 21 mmol/L (ref 18–29)
Creatinine, Ser: 1.93 mg/dL — ABNORMAL HIGH (ref 0.76–1.27)
GFR calc Af Amer: 37 mL/min/{1.73_m2} — ABNORMAL LOW (ref >59)
GFR calc non Af Amer: 32 mL/min/{1.73_m2} — ABNORMAL LOW (ref >59)
Glucose: 153 mg/dL — ABNORMAL HIGH (ref 65–99)
Potassium: 4.6 mmol/L (ref 3.5–5.2)
Sodium: 140 mmol/L (ref 134–144)

## 2016-10-02 ENCOUNTER — Telehealth: Payer: Self-pay | Admitting: Interventional Cardiology

## 2016-10-02 NOTE — Telephone Encounter (Signed)
Sent labs to Dr. Florene Glen.  Spoke with pt and made him aware these had been sent over.

## 2016-10-02 NOTE — Telephone Encounter (Signed)
Aaron Carlson is calling because he needs a couple of his lab results sent to his Kidney doctor, Dr. Erling Cruz (kidney specialist). Dr. Florene Glen is at the [facility] [address], [place], [place] [address] ph# [phone] and fax# [phone]). They need the most recent results, thanks. Marland Kitchen

## 2016-10-06 ENCOUNTER — Ambulatory Visit: Payer: Medicare PPO | Admitting: Physician Assistant

## 2016-10-15 ENCOUNTER — Other Ambulatory Visit: Payer: Self-pay | Admitting: Interventional Cardiology

## 2016-10-16 ENCOUNTER — Telehealth: Payer: Self-pay | Admitting: *Deleted

## 2016-10-16 DIAGNOSIS — R0683 Snoring: Secondary | ICD-10-CM

## 2016-10-16 NOTE — Telephone Encounter (Signed)
Called the patient to let him know that his in lab sleep study was cancelled due to a denial from his insurance, he verbalized understanding.

## 2016-10-16 NOTE — Telephone Encounter (Signed)
-----   Message from Elease Etienne sent at 10/16/2016  1:03 PM EST ----- Regarding: denied sleep studu Contact: [phone]     This patient sleep study was denied so it has to be set up for a home study . I will call the patient and let the a patient know it was denied and that  Someone - ( not sure if Gae Bon or the nurse does it)  would get back to him  as soon as the home study was set up.   Let me know if I can help with anything.

## 2016-10-16 NOTE — Telephone Encounter (Signed)
-----   Message from Elease Etienne sent at 10/16/2016  1:03 PM EST ----- Regarding: denied sleep studu Contact: [phone]     This patient sleep study was denied so it has to be set up for a home study . I will call the patient and let the a patient know it was denied and that  Someone - ( not sure if Gae Bon or the nurse does it)  would get back to him  as soon as the home study was set up.   Let me know if I can help with anything.

## 2016-10-16 NOTE — Telephone Encounter (Signed)
Belva Crome, MD  Loren Racer, LPN  Phone Number: [phone]        Will insurance pay for a home study?  If so, yes. Other possibility would be to see if he will pay out of pocket.  If neither is possible, then we won't do it.     Placed order for home sleep study.

## 2016-10-18 ENCOUNTER — Encounter (HOSPITAL_BASED_OUTPATIENT_CLINIC_OR_DEPARTMENT_OTHER): Payer: Medicare PPO

## 2016-10-20 ENCOUNTER — Other Ambulatory Visit: Payer: Self-pay | Admitting: *Deleted

## 2016-10-21 ENCOUNTER — Telehealth: Payer: Self-pay | Admitting: Interventional Cardiology

## 2016-10-21 NOTE — Telephone Encounter (Signed)
Initially told by insurance that sleep study at [facility] was going to be denied on 10-16-16 (Friday).  Pt was scheduled for that Sunday night, 10-18-16. Test was canx'd and pt was set up to have home sleep study.  Per [name], [facility] Sleep Coordinator, HST was sent out to pt this morning.  Lars Pinks, RN says ok to leave as HST.

## 2016-10-23 NOTE — Telephone Encounter (Signed)
The patient was set up for a home sleep study for 11/09/2016. The precert department will notify me if he is denied

## 2016-11-05 ENCOUNTER — Telehealth: Payer: Self-pay | Admitting: Interventional Cardiology

## 2016-11-05 NOTE — Telephone Encounter (Signed)
New message    pt verbalized that he is calling call for rn about this sleep study and results

## 2016-11-05 NOTE — Telephone Encounter (Signed)
Called the patient back and let him know that his sleep study came in today and will be scanned in for dr turner to read next week Monday 11/09/16  when she returns

## 2016-11-06 ENCOUNTER — Telehealth: Payer: Self-pay | Admitting: *Deleted

## 2016-11-06 DIAGNOSIS — I5032 Chronic diastolic (congestive) heart failure: Secondary | ICD-10-CM

## 2016-11-06 NOTE — Telephone Encounter (Signed)
-----   Message from Sueanne Margarita, MD sent at 11/05/2016  5:04 PM EST ----- Disregard prior order to set up OV with me to discuss home sleep study.  He has an elevated epworth sleepiness score of 13 with CHF.  Recommend CPAP titration in lab due to CHF.

## 2016-11-09 ENCOUNTER — Encounter (HOSPITAL_BASED_OUTPATIENT_CLINIC_OR_DEPARTMENT_OTHER): Payer: Medicare PPO

## 2016-11-11 ENCOUNTER — Encounter: Payer: Self-pay | Admitting: *Deleted

## 2016-11-25 ENCOUNTER — Telehealth: Payer: Self-pay | Admitting: *Deleted

## 2016-11-25 DIAGNOSIS — I5043 Acute on chronic combined systolic (congestive) and diastolic (congestive) heart failure: Secondary | ICD-10-CM

## 2016-11-25 NOTE — Telephone Encounter (Signed)
-----   Message from Sueanne Margarita, MD sent at 11/19/2016 12:50 PM EST ----- Pleaes find out if he would be willing to do an autoCPAP titration at home

## 2016-11-25 NOTE — Telephone Encounter (Signed)
Called the patient and left a message to call back.

## 2016-11-25 NOTE — Telephone Encounter (Signed)
Patient called back and agreed to do the auto  cpap at home

## 2016-12-23 ENCOUNTER — Telehealth: Payer: Self-pay | Admitting: *Deleted

## 2016-12-23 NOTE — Telephone Encounter (Signed)
The patient has agreed to have an in home cpap titration done. Per Dr Radford Pax Order sent to [facility]

## 2016-12-23 NOTE — Addendum Note (Signed)
Addended by: Freada Bergeron on: 12/23/2016 02:20 PM   Modules accepted: Orders

## 2016-12-29 ENCOUNTER — Encounter (HOSPITAL_BASED_OUTPATIENT_CLINIC_OR_DEPARTMENT_OTHER): Payer: Medicare PPO

## 2017-01-02 ENCOUNTER — Other Ambulatory Visit: Payer: Self-pay

## 2017-01-02 MED ORDER — FLUTICASONE PROPIONATE 50 MCG/ACT NA SUSP: 2.0000 | Freq: Every day | NASAL | 0 refills | Status: DC

## 2017-01-04 ENCOUNTER — Telehealth: Payer: Self-pay | Admitting: *Deleted

## 2017-01-04 NOTE — Telephone Encounter (Signed)
Patient called stating the company called and offered him the cpap machine for $800.00 to do a in home cpap tittartion and he refused. I will call [facility] tomorrow to check on this matter

## 2017-01-15 ENCOUNTER — Encounter: Payer: Self-pay | Admitting: *Deleted

## 2017-01-15 NOTE — Telephone Encounter (Addendum)
Called the patient to inform him that his insurance [employer] has over turned their decision and will now pay for his in lab cpap titration. The patient says he is willing to have the titration done. [employer] MZTA#[id number] valid 4-3 to 01-28-16  [employer] = no precert required for mbrs plan. Patient understands the titration has been ordered epic. Patient understands he will be contacted via the mail. Sleep lab notified to reschedule cpap titration.

## 2017-03-01 ENCOUNTER — Encounter (HOSPITAL_BASED_OUTPATIENT_CLINIC_OR_DEPARTMENT_OTHER): Payer: Medicare PPO

## 2017-03-23 ENCOUNTER — Other Ambulatory Visit: Payer: Self-pay | Admitting: Family Medicine

## 2017-03-23 DIAGNOSIS — N63 Unspecified lump in unspecified breast: Secondary | ICD-10-CM

## 2017-03-30 ENCOUNTER — Ambulatory Visit
Admission: RE | Admit: 2017-03-30 | Discharge: 2017-03-30 | Disposition: A | Discharge status: Home / Self Care | Payer: Medicare PPO | Source: Ambulatory Visit | Attending: Family Medicine | Admitting: Family Medicine

## 2017-03-30 ENCOUNTER — Ambulatory Visit: Payer: Medicare PPO

## 2017-03-30 DIAGNOSIS — N63 Unspecified lump in unspecified breast: Secondary | ICD-10-CM

## 2017-10-27 ENCOUNTER — Other Ambulatory Visit: Payer: Self-pay | Admitting: Interventional Cardiology

## 2017-11-01 ENCOUNTER — Other Ambulatory Visit: Payer: Self-pay | Admitting: Interventional Cardiology

## 2017-11-01 MED ORDER — METOPROLOL SUCCINATE ER 50 MG PO TB24: ORAL_TABLET | ORAL | 0 refills | Status: DC

## 2017-11-01 NOTE — Telephone Encounter (Signed)
Pt's medication was sent to pt's pharmacy as requested. Confirmation received.  °

## 2017-11-01 NOTE — Telephone Encounter (Signed)
New message     *STAT* If patient is at the pharmacy, call can be transferred to refill team.   1. Which medications need to be refilled? (please list name of each medication and dose if known) metoprolol succinate (TOPROL-XL) 50 MG 24 hr tablet  2. Which pharmacy/location (including street and city if local pharmacy) is medication to be sent to?[place], [address]  3. Do they need a 30 day or 90 day supply? [place]

## 2017-11-30 ENCOUNTER — Encounter: Payer: Self-pay | Admitting: Interventional Cardiology

## 2017-11-30 NOTE — Progress Notes (Signed)
Cardiology Office Note    Date:  12/01/2017   ID:  Aaron Carlson, DOB Jul 14, 1933, MRN [id number]  PCP:  Antony Contras, MD  Cardiologist: Sinclair Grooms, MD   Chief Complaint  Patient presents with  . Coronary Artery Disease  . Shortness of Breath    History of Present Illness:  Aaron Carlson is a 84 y.o. male who presents for CAD, documented diffuse vessel disease of diabetic variety by angiography, essential hypertension, chronic renal insufficiency, and elderly and frail.   He is doing well.  He has not had angina.  He denies dyspnea, orthopnea, edema, PND, syncope, palpitations, chest pain.   Past Medical History:  Diagnosis Date  . CAD in native artery 08/28/2013   Heaviness in chest with exertion. Coronary angiography 2005 demonstrated severe distal LAD and diagonal disease, severe distal RCA and PL OM disease, and severe first obtuse marginal disease. No high-grade proximal coronary disease was present at the time.  The most recent myocardial perfusion study in 2014 was nonischemic/low risk    . Chronic renal insufficiency   . Colon polyps    adenomatous  . Diabetes mellitus   . Hyperlipemia   . Hypertension   . Laryngeal papillomatosis   . Prostate cancer ([place])    w/ mets to bladder    Past Surgical History:  Procedure Laterality Date  . BLADDER SURGERY    . CIRCUMCISION  2011  . POLYPECTOMY     vocal cords  . PROSTATE SURGERY      Current Medications: Outpatient Medications Prior to Visit  Medication Sig Dispense Refill  . aspirin 81 MG tablet Take 81 mg by mouth daily.      Marland Kitchen atorvastatin (LIPITOR) 20 MG tablet Take 20 mg by mouth daily.  0  . azelastine (ASTELIN) 0.1 % nasal spray Place 2 sprays into both nostrils 2 (two) times daily. Use in each nostril as directed 30 mL 12  . BD INSULIN SYRINGE ULTRAFINE 31G X 5/16" 0.5 ML MISC Use as directed    . benazepril (LOTENSIN) 20 MG tablet TAKE ONE TABLET BY MOUTH ONCE DAILY 90 tablet 3  . Calcium  Carbonate-Vitamin D (CALCIUM + D PO) Take 1 tablet by mouth daily.     . fish oil-omega-3 fatty acids 1000 MG capsule Take 1 g by mouth daily. Reported on 11/16/2015    . fluticasone (FLONASE) 50 MCG/ACT nasal spray Place 2 sprays into both nostrils daily. To control allergies 16 g 0  . Garlic 413 MG TABS Take 1 tablet by mouth daily.     . insulin lispro (HUMALOG) 100 UNIT/ML injection Use as directed daily at bedtime as directed per sliding scale.    . insulin NPH Human (HUMULIN N,NOVOLIN N) 100 UNIT/ML injection Inject 45 Units into the skin at bedtime.    . metoprolol succinate (TOPROL-XL) 50 MG 24 hr tablet Take 1.5 tablets (75 mg total) by mouth daily. Please keep upcoming appt for future refills. Thank you 135 tablet 0  . Multiple Vitamin (MULTIVITAMIN) tablet Take 1 tablet by mouth daily.      . niacin (NIASPAN) 500 MG CR tablet Take 500 mg by mouth at bedtime.    . nitroGLYCERIN (NITROSTAT) 0.4 MG SL tablet Place 1 tablet (0.4 mg total) under the tongue every 5 (five) minutes as needed for chest pain. 25 tablet 3  . ONE TOUCH ULTRA TEST test strip Use as directed    . furosemide (LASIX) 40 MG tablet Take  0.5 tablets (20 mg total) by mouth daily. 45 tablet 3   No facility-administered medications prior to visit.      Allergies:   Penicillins   Social History   Socioeconomic History  . Marital status: Married    Spouse name: None  . Number of children: 4  . Years of education: None  . Highest education level: None  Social Needs  . Financial resource strain: None  . Food insecurity - worry: None  . Food insecurity - inability: None  . Transportation needs - medical: None  . Transportation needs - non-medical: None  Occupational History  . Occupation: Retired  Tobacco Use  . Smoking status: Former Research scientist (life sciences)  . Smokeless tobacco: Never Used  Substance and Sexual Activity  . Alcohol use: Yes    Comment: social  . Drug use: No  . Sexual activity: None  Other Topics Concern  .  None  Social History Narrative  . None     Family History:  The patient's family history includes Breast cancer in his mother; Diabetes in his mother; Heart disease in his father; Heart failure in his father; Pancreatic cancer in his brother.   ROS:   Please see the history of present illness.    He has hip and back discomfort.  Occasional diarrhea.  He has bloating in his stomach.  Snoring occurs.  Epigastric discomfort has been resolved to GI irritation.  He has been instructed to stop taking aspirin. All other systems reviewed and are negative.   PHYSICAL EXAM:   VS:  BP (!) 142/70   Pulse 80   Ht 5\' 8"  (1.727 m)   Wt 229 lb (103.9 kg)   BMI 34.82 kg/m    GEN: Well nourished, well developed, in no acute distress  HEENT: normal  Neck: no JVD, carotid bruits, or masses Cardiac: RRR; no murmurs, rubs, or gallops,no edema  Respiratory:  clear to auscultation bilaterally, normal work of breathing GI: soft, nontender, nondistended, + BS MS: no deformity or atrophy  Skin: warm and dry, no rash Neuro:  Alert and Oriented x 3, Strength and sensation are intact Psych: euthymic mood, full affect  Wt Readings from Last 3 Encounters:  12/01/17 229 lb (103.9 kg)  09/09/16 239 lb (108.4 kg)  12/16/15 234 lb (106.1 kg)      Studies/Labs Reviewed:   EKG:  EKG normal sinus rhythm, left axis deviation, right bundle branch block.  No change compared to 2017 tracing.  Recent Labs: No results found for requested labs within last 8760 hours.   Lipid Panel    Component Value Date/Time   CHOL 119 08/16/2006 0816   TRIG 101 08/16/2006 0816   HDL 42.4 08/16/2006 0816   CHOLHDL 2.8 CALC 08/16/2006 0816   VLDL 20 08/16/2006 0816   LDLCALC 56 08/16/2006 0816    Additional studies/ records that were reviewed today include:  none    ASSESSMENT:    1. Coronary artery disease involving native coronary artery of native heart with angina pectoris ([place])   2. Mixed hyperlipidemia   3.  RBBB (right bundle branch block with left anterior fascicular block)   4. Essential hypertension   5. Chronic diastolic heart failure ([place])   6. CKD (chronic kidney disease) stage 3, GFR 30-59 ml/min ([facility])      PLAN:  In order of problems listed above:  1. Stable angina. 2. LDL target should be less than 70. 3. No changes occurred in tracing. 4. Blood pressure target ideally  should be 130/80.  He is slightly above that.  We discussed weight loss, good quality sleep, and sodium restriction. 5. No evidence of volume overload. 6. Last creatinine by Dr. Florene Glen was 2.12 done in January of this year.  I encouraged aerobic exercise.  Notify us of chest discomfort.  Notify us if swelling or orthopnea.    Medication Adjustments/Labs and Tests Ordered: Current medicines are reviewed at length with the patient today.  Concerns regarding medicines are outlined above.  Medication changes, Labs and Tests ordered today are listed in the Patient Instructions below. There are no Patient Instructions on file for this visit.   Signed, Sinclair Grooms, MD  12/01/2017 3:15 PM    [facility] [place], [place], [place]  [address] Phone: [phone]; Fax: [phone]

## 2017-12-01 ENCOUNTER — Ambulatory Visit (INDEPENDENT_AMBULATORY_CARE_PROVIDER_SITE_OTHER): Payer: Medicare PPO | Admitting: Interventional Cardiology

## 2017-12-01 ENCOUNTER — Encounter: Payer: Self-pay | Admitting: Interventional Cardiology

## 2017-12-01 VITALS — BP 142/70 | HR 80 | Ht 68.0 in | Wt 229.0 lb

## 2017-12-01 DIAGNOSIS — I25119 Atherosclerotic heart disease of native coronary artery with unspecified angina pectoris: Secondary | ICD-10-CM | POA: Diagnosis not present

## 2017-12-01 DIAGNOSIS — I5032 Chronic diastolic (congestive) heart failure: Secondary | ICD-10-CM

## 2017-12-01 DIAGNOSIS — I452 Bifascicular block: Secondary | ICD-10-CM

## 2017-12-01 DIAGNOSIS — N183 Chronic kidney disease, stage 3 unspecified: Secondary | ICD-10-CM

## 2017-12-01 DIAGNOSIS — I10 Essential (primary) hypertension: Secondary | ICD-10-CM | POA: Diagnosis not present

## 2017-12-01 DIAGNOSIS — E782 Mixed hyperlipidemia: Secondary | ICD-10-CM | POA: Diagnosis not present

## 2017-12-01 NOTE — Patient Instructions (Signed)

## 2018-01-12 ENCOUNTER — Ambulatory Visit: Payer: Medicare PPO | Admitting: Physician Assistant

## 2018-02-02 ENCOUNTER — Ambulatory Visit (INDEPENDENT_AMBULATORY_CARE_PROVIDER_SITE_OTHER): Payer: Medicare PPO | Admitting: Internal Medicine

## 2018-02-02 ENCOUNTER — Encounter: Payer: Self-pay | Admitting: Internal Medicine

## 2018-02-02 VITALS — BP 134/78 | HR 74 | Ht 68.0 in | Wt 227.5 lb

## 2018-02-02 DIAGNOSIS — R12 Heartburn: Secondary | ICD-10-CM

## 2018-02-02 DIAGNOSIS — R197 Diarrhea, unspecified: Secondary | ICD-10-CM | POA: Diagnosis not present

## 2018-02-02 DIAGNOSIS — K219 Gastro-esophageal reflux disease without esophagitis: Secondary | ICD-10-CM

## 2018-02-02 DIAGNOSIS — R142 Eructation: Secondary | ICD-10-CM

## 2018-02-02 DIAGNOSIS — Z860100 Personal history of colon polyps, unspecified: Secondary | ICD-10-CM

## 2018-02-02 MED ORDER — OMEPRAZOLE 40 MG PO CPDR: 40.0000 mg | DELAYED_RELEASE_CAPSULE | Freq: Every day | ORAL | 3 refills | Status: DC

## 2018-02-02 NOTE — Patient Instructions (Signed)
Make sure you take your Omeprazole every day.  Take 1 Align a day for one month  Use carbonated beverages as needed to help you burp  Please follow up as needed

## 2018-02-02 NOTE — Progress Notes (Signed)
HISTORY OF PRESENT ILLNESS:  Aaron Carlson is a 84 y.o. male , husband of Aaron Carlson, who is self-referred for evaluation of dyspeptic complaints. I last saw the patient February 2015. The impression at that time was recurrent and chronic chest pain possibly related to GERD and a history of adenomatous colon polyps being followed by Dr. Jessie Carlson GI. I prescribed omeprazole and asked the patient follow-up in 4 weeks. He did not. Has not been seen since. He tells me that he has had problems with postprandial churning and gas in the abdomen with some pressure in the upper abdomen and chest which is only relieved with belching. He can promote belching with carbonated beverages such as Pepsi or Coca-Cola. The patient denies any alarm features such as food avoidance, abdominal pain, bleeding, or weight loss. He's also had problems with diarrhea for which he has self medicated with Imodium, which helps. He does have classic reflux symptoms but is currently not taking PPI. No dysphagia. Interestingly, it appears that he was evaluated by an underwent upper endoscopy with Dr. Penelope Carlson in January 2019. Pathology report shows that the patient underwent gastric biopsies which were read as showing "chronic inactive gastritis". No Helicobacter pylori or intestinal metaplasia. As the patient is on satisfied that he continues with chronic dyspeptic symptomshe made this appointment. He is diabetic and takes insulin. He has not tried probiotics. He describes his bowel habits as more regular since taking antidiarrheals. No bleeding. He is in a colonoscopy surveillance program with Dr. Penelope Carlson.  REVIEW OF SYSTEMS:  All non-GI ROS negative unless otherwise stated in the history of present illness except for muscle cramps, night sweats  Past Medical History:  Diagnosis Date  . CAD in native artery 08/28/2013   Heaviness in chest with exertion. Coronary angiography 2005 demonstrated severe distal LAD and diagonal disease, severe distal  RCA and PL OM disease, and severe first obtuse marginal disease. No high-grade proximal coronary disease was present at the time.  The most recent myocardial perfusion study in 2014 was nonischemic/low risk    . Chronic renal insufficiency   . Colon polyps    adenomatous  . Diabetes mellitus   . Hyperlipemia   . Hypertension   . Laryngeal papillomatosis   . Prostate cancer ([place])    w/ mets to bladder    Past Surgical History:  Procedure Laterality Date  . BLADDER SURGERY    . CIRCUMCISION  2011  . POLYPECTOMY     vocal cords  . [place]  reports that he has quit smoking. He has never used smokeless tobacco. He reports that he drinks alcohol. He reports that he does not use drugs.  family history includes Breast cancer in his mother; Diabetes in his mother; Heart disease in his father; Heart failure in his father; Pancreatic cancer in his brother.  Allergies  Allergen Reactions  . Penicillins Rash       PHYSICAL EXAMINATION: Vital signs: BP 134/78   Pulse 74   Ht 5\' 8"  (1.727 m)   Wt 227 lb 8 oz (103.2 kg)   BMI 34.59 kg/m   Constitutional:pleasant, generally well-appearing, no acute distress Psychiatric: alert and oriented x3, cooperative Eyes: extraocular movements intact, anicteric, conjunctiva pink Mouth: oral pharynx moist, no lesions Neck: supple no lymphadenopathy Cardiovascular: heart regular rate and rhythm, no murmur Lungs: clear to auscultation bilaterally Abdomen: soft, nontender, nondistended, no obvious ascites, no peritoneal signs, normal bowel sounds, no  organomegaly Rectal:ommitted Extremities: no clubbing, cyanosis, or lower extremity edema bilaterally Skin: no lesions on visible extremities Neuro: No focal deficits. Cranial nerves intact  ASSESSMENT:  #1. Dyspepsia with primary gas bloat #2. GERD. Untreated #3. Colon cancer surveillance program elsewhere #4. Intermittent diarrhea. Improved with  antidiarrheals  PLAN:  #1. Prescribed omeprazole 40 mg daily to address reflux symptoms. I was cleared to him that this would not help his dyspeptic gas bloat rather his heartburn and uncomfortable regurgitation #2. Probiotic Align one daily for one month. I have provided him with one month of samples to assist with cost concerns #3. Drink carbonated beverages on demand. This induces belching which for some relief. #4. Reassurance regarding his symptoms #5. Continue antidiarrheals on demand #6. Resume colon cancer surveillance with his primary GI, Dr. Penelope Carlson   45 minutes spent face-to-face with the patient. Greater than 50% a time use for counseling dressing is reflux, dyspepsia, gas bloat, bowel habit change, and carefully outlining treatment recommendations and the rationale as he seems to have suboptimal insight

## 2018-02-10 ENCOUNTER — Other Ambulatory Visit: Payer: Self-pay | Admitting: Interventional Cardiology

## 2018-07-12 ENCOUNTER — Encounter (INDEPENDENT_AMBULATORY_CARE_PROVIDER_SITE_OTHER): Payer: Self-pay

## 2018-07-12 ENCOUNTER — Ambulatory Visit (INDEPENDENT_AMBULATORY_CARE_PROVIDER_SITE_OTHER): Payer: Medicare PPO | Admitting: Internal Medicine

## 2018-07-12 ENCOUNTER — Encounter: Payer: Self-pay | Admitting: Internal Medicine

## 2018-07-12 ENCOUNTER — Encounter

## 2018-07-12 VITALS — BP 100/56 | HR 65 | Ht 68.0 in | Wt 225.0 lb

## 2018-07-12 DIAGNOSIS — R142 Eructation: Secondary | ICD-10-CM

## 2018-07-12 DIAGNOSIS — K219 Gastro-esophageal reflux disease without esophagitis: Secondary | ICD-10-CM | POA: Diagnosis not present

## 2018-07-12 DIAGNOSIS — R1013 Epigastric pain: Secondary | ICD-10-CM

## 2018-07-12 MED ORDER — PANTOPRAZOLE SODIUM 40 MG PO TBEC: 40.0000 mg | DELAYED_RELEASE_TABLET | Freq: Every day | ORAL | 11 refills | Status: DC

## 2018-07-12 NOTE — Progress Notes (Signed)
HISTORY OF PRESENT ILLNESS:  ILYAAS Carlson is a 84 y.o. male with past medical history as listed below who was last seen in this office Feb 02, 2018 regarding GERD and chronic dyspeptic symptoms.  He was prescribed omeprazole and probiotic.  He presents today with ongoing dyspeptic symptoms.  He stopped his PPI on his own and is now taking aloe.  His biggest complaint is bloating and belching.  He has experienced some pyrosis off PPI.  No dysphasia.  No weight loss.  No vomiting.  No bleeding.  No fevers.  He continues to get relief of gas bloat with belching after consuming carbonated beverages.  No interval data to report.  Patient has lots of concerns and questions  REVIEW OF SYSTEMS:  All non-GI ROS negative as otherwise stated in the HPI except for itching and shortness of breath  Past Medical History:  Diagnosis Date  . CAD in native artery 08/28/2013   Heaviness in chest with exertion. Coronary angiography 2005 demonstrated severe distal LAD and diagonal disease, severe distal RCA and PL OM disease, and severe first obtuse marginal disease. No high-grade proximal coronary disease was present at the time.  The most recent myocardial perfusion study in 2014 was nonischemic/low risk    . Chronic renal insufficiency   . Colon polyps    adenomatous  . Diabetes mellitus   . Hyperlipemia   . Hypertension   . Laryngeal papillomatosis   . Prostate cancer ([place])    w/ mets to bladder    Past Surgical History:  Procedure Laterality Date  . BLADDER SURGERY    . CIRCUMCISION  2011  . POLYPECTOMY     vocal cords  . [place]  reports that he has quit smoking. He has never used smokeless tobacco. He reports that he drinks alcohol. He reports that he does not use drugs.  family history includes Breast cancer in his mother; Diabetes in his mother; Heart disease in his father; Heart failure in his father; Pancreatic cancer in his brother.  Allergies   Allergen Reactions  . Penicillins Rash       PHYSICAL EXAMINATION: Vital signs: BP (!) 100/56   Pulse 65   Ht 5\' 8"  (1.727 m)   Wt 225 lb (102.1 kg)   BMI 34.21 kg/m   Constitutional: generally well-appearing, no acute distress Psychiatric: alert and oriented x3, cooperative Eyes: extraocular movements intact, anicteric, conjunctiva pink Mouth: oral pharynx moist, no lesions Neck: supple no lymphadenopathy Cardiovascular: heart regular rate and rhythm, no murmur Lungs: clear to auscultation bilaterally Abdomen: soft, nontender, nondistended, no obvious ascites, no peritoneal signs, normal bowel sounds, no organomegaly.  Prior surgical incision well-healed Rectal: Omitted Extremities: no lower extremity edema bilaterally Skin: no lesions on visible extremities Neuro: No focal deficits.  Cranial nerves intact  ASSESSMENT:  1.  GERD.  Recurrent symptoms off PPI 2.  Chronic dyspepsia 3.  Bloating 4.  Colonoscopy surveillance program completed elsewhere   PLAN:  1.  Reflux precautions 2.  Prescribe pantoprazole 40 mg daily 3.  Discussion on gas bloat 4.  Antireflux diet 5.  Anti-gas and flatulence dietary measures reviewed 6.  Resume general medical care with PCP  25 minutes spent face-to-face with the patient.  The overwhelming majority of the time was spent counseling regarding his chronic dyspeptic complaints, trying to provide insight and reassurance, and reviewing appropriate measures for classic reflux.  Also trying to have him understand the  difference between dyspepsia and belching and uncontrolled reflux

## 2018-07-12 NOTE — Patient Instructions (Signed)
Please follow up as needed 

## 2018-07-26 ENCOUNTER — Other Ambulatory Visit (HOSPITAL_COMMUNITY): Payer: Self-pay | Admitting: *Deleted

## 2018-07-27 ENCOUNTER — Ambulatory Visit (HOSPITAL_COMMUNITY)
Admission: RE | Admit: 2018-07-27 | Discharge: 2018-07-27 | Disposition: A | Discharge status: Home / Self Care | Payer: Medicare PPO | Source: Ambulatory Visit | Attending: Nephrology | Admitting: Nephrology

## 2018-07-27 VITALS — BP 137/55 | HR 70 | Temp 98.6°F | Resp 20 | Ht 68.0 in | Wt 222.0 lb

## 2018-07-27 DIAGNOSIS — D631 Anemia in chronic kidney disease: Secondary | ICD-10-CM | POA: Diagnosis present

## 2018-07-27 DIAGNOSIS — N183 Chronic kidney disease, stage 3 unspecified: Secondary | ICD-10-CM

## 2018-07-27 LAB — POCT HEMOGLOBIN-HEMACUE: HEMOGLOBIN: 10.7 g/dL — AB (ref 13.0–17.0)

## 2018-07-27 MED ORDER — EPOETIN ALFA-EPBX 10000 UNIT/ML IJ SOLN
20000.0000 [IU] | INTRAMUSCULAR | Status: DC
  Administered 2018-07-27: 10:00:00 20000 [IU] via SUBCUTANEOUS
  Filled 2018-07-27: qty 2

## 2018-07-27 MED ORDER — SODIUM CHLORIDE 0.9 % IV SOLN
510.0000 mg | INTRAVENOUS | Status: DC
  Administered 2018-07-27: 510 mg via INTRAVENOUS
  Filled 2018-07-27: qty 17

## 2018-07-27 NOTE — Discharge Instructions (Signed)
Ferumoxytol injection What is this medicine? FERUMOXYTOL is an iron complex. Iron is used to make healthy red blood cells, which carry oxygen and nutrients throughout the body. This medicine is used to treat iron deficiency anemia in people with chronic kidney disease. This medicine may be used for other purposes; ask your health care provider or pharmacist if you have questions. COMMON BRAND NAME(S): Feraheme What should I tell my health care provider before I take this medicine? They need to know if you have any of these conditions: -anemia not caused by low iron levels -high levels of iron in the blood -magnetic resonance imaging (MRI) test scheduled -an unusual or allergic reaction to iron, other medicines, foods, dyes, or preservatives -pregnant or trying to get pregnant -breast-feeding How should I use this medicine? This medicine is for injection into a vein. It is given by a health care professional in a hospital or clinic setting. Talk to your pediatrician regarding the use of this medicine in children. Special care may be needed. Overdosage: If you think you have taken too much of this medicine contact a poison control center or emergency room at once. NOTE: This medicine is only for you. Do not share this medicine with others. What if I miss a dose? It is important not to miss your dose. Call your doctor or health care professional if you are unable to keep an appointment. What may interact with this medicine? This medicine may interact with the following medications: -other iron products This list may not describe all possible interactions. Give your health care provider a list of all the medicines, herbs, non-prescription drugs, or dietary supplements you use. Also tell them if you smoke, drink alcohol, or use illegal drugs. Some items may interact with your medicine. What should I watch for while using this medicine? Visit your doctor or healthcare professional regularly. Tell  your doctor or healthcare professional if your symptoms do not start to get better or if they get worse. You may need blood work done while you are taking this medicine. You may need to follow a special diet. Talk to your doctor. Foods that contain iron include: whole grains/cereals, dried fruits, beans, or peas, leafy green vegetables, and organ meats (liver, kidney). What side effects may I notice from receiving this medicine? Side effects that you should report to your doctor or health care professional as soon as possible: -allergic reactions like skin rash, itching or hives, swelling of the face, lips, or tongue -breathing problems -changes in blood pressure -feeling faint or lightheaded, falls -fever or chills -flushing, sweating, or hot feelings -swelling of the ankles or feet Side effects that usually do not require medical attention (report to your doctor or health care professional if they continue or are bothersome): -diarrhea -headache -nausea, vomiting -stomach pain This list may not describe all possible side effects. Call your doctor for medical advice about side effects. You may report side effects to FDA at 1-800-FDA-1088. Where should I keep my medicine? This drug is given in a hospital or clinic and will not be stored at home. NOTE: This sheet is a summary. It may not cover all possible information. If you have questions about this medicine, talk to your doctor, pharmacist, or health care provider.  2018 Elsevier/Gold Standard (2015-10-17 12:41:49) Epoetin Alfa injection What is this medicine? EPOETIN ALFA (e POE e tin AL fa) helps your body make more red blood cells. This medicine is used to treat anemia caused by chronic kidney failure, cancer  chemotherapy, or HIV-therapy. It may also be used before surgery if you have anemia. This medicine may be used for other purposes; ask your health care provider or pharmacist if you have questions. COMMON BRAND NAME(S): Epogen,  Procrit, RETACRIT What should I tell my health care provider before I take this medicine? They need to know if you have any of these conditions: -blood clotting disorders -cancer patient not on chemotherapy -cystic fibrosis -heart disease, such as angina or heart failure -hemoglobin level of 12 g/dL or greater -high blood pressure -low levels of folate, iron, or vitamin B12 -seizures -an unusual or allergic reaction to erythropoietin, albumin, benzyl alcohol, hamster proteins, other medicines, foods, dyes, or preservatives -pregnant or trying to get pregnant -breast-feeding How should I use this medicine? This medicine is for injection into a vein or under the skin. It is usually given by a health care professional in a hospital or clinic setting. If you get this medicine at home, you will be taught how to prepare and give this medicine. Use exactly as directed. Take your medicine at regular intervals. Do not take your medicine more often than directed. It is important that you put your used needles and syringes in a special sharps container. Do not put them in a trash can. If you do not have a sharps container, call your pharmacist or healthcare provider to get one. A special MedGuide will be given to you by the pharmacist with each prescription and refill. Be sure to read this information carefully each time. Talk to your pediatrician regarding the use of this medicine in children. While this drug may be prescribed for selected conditions, precautions do apply. Overdosage: If you think you have taken too much of this medicine contact a poison control center or emergency room at once. NOTE: This medicine is only for you. Do not share this medicine with others. What if I miss a dose? If you miss a dose, take it as soon as you can. If it is almost time for your next dose, take only that dose. Do not take double or extra doses. What may interact with this medicine? Do not take this medicine  with any of the following medications: -darbepoetin alfa This list may not describe all possible interactions. Give your health care provider a list of all the medicines, herbs, non-prescription drugs, or dietary supplements you use. Also tell them if you smoke, drink alcohol, or use illegal drugs. Some items may interact with your medicine. What should I watch for while using this medicine? Your condition will be monitored carefully while you are receiving this medicine. You may need blood work done while you are taking this medicine. What side effects may I notice from receiving this medicine? Side effects that you should report to your doctor or health care professional as soon as possible: -allergic reactions like skin rash, itching or hives, swelling of the face, lips, or tongue -breathing problems -changes in vision -chest pain -confusion, trouble speaking or understanding -feeling faint or lightheaded, falls -high blood pressure -muscle aches or pains -pain, swelling, warmth in the leg -rapid weight gain -severe headaches -sudden numbness or weakness of the face, arm or leg -trouble walking, dizziness, loss of balance or coordination -seizures (convulsions) -swelling of the ankles, feet, hands -unusually weak or tired Side effects that usually do not require medical attention (report to your doctor or health care professional if they continue or are bothersome): -diarrhea -fever, chills (flu-like symptoms) -headaches -nausea, vomiting -redness, stinging, or  swelling at site where injected This list may not describe all possible side effects. Call your doctor for medical advice about side effects. You may report side effects to FDA at 1-800-FDA-1088. Where should I keep my medicine? Keep out of the reach of children. Store in a refrigerator between 2 and 8 degrees C (36 and 46 degrees F). Do not freeze or shake. Throw away any unused portion if using a single-dose vial.  Multi-dose vials can be kept in the refrigerator for up to 21 days after the initial dose. Throw away unused medicine. NOTE: This sheet is a summary. It may not cover all possible information. If you have questions about this medicine, talk to your doctor, pharmacist, or health care provider.  2018 Elsevier/Gold Standard (2016-05-04 19:42:31)

## 2018-08-03 ENCOUNTER — Ambulatory Visit (HOSPITAL_COMMUNITY)
Admission: RE | Admit: 2018-08-03 | Discharge: 2018-08-03 | Disposition: A | Discharge status: Home / Self Care | Payer: Medicare PPO | Source: Ambulatory Visit | Attending: Nephrology | Admitting: Nephrology

## 2018-08-03 DIAGNOSIS — D631 Anemia in chronic kidney disease: Secondary | ICD-10-CM | POA: Insufficient documentation

## 2018-08-03 DIAGNOSIS — N189 Chronic kidney disease, unspecified: Secondary | ICD-10-CM | POA: Insufficient documentation

## 2018-08-03 MED ORDER — SODIUM CHLORIDE 0.9 % IV SOLN
510.0000 mg | INTRAVENOUS | Status: AC
  Administered 2018-08-03: 510 mg via INTRAVENOUS
  Filled 2018-08-03: qty 17

## 2018-08-10 ENCOUNTER — Encounter (HOSPITAL_COMMUNITY): Payer: Medicare PPO

## 2018-08-11 ENCOUNTER — Ambulatory Visit (HOSPITAL_COMMUNITY)
Admission: RE | Admit: 2018-08-11 | Discharge: 2018-08-11 | Disposition: A | Discharge status: Home / Self Care | Payer: Medicare PPO | Source: Ambulatory Visit | Attending: Nephrology | Admitting: Nephrology

## 2018-08-11 VITALS — BP 120/49 | HR 72 | Temp 98.5°F | Resp 20

## 2018-08-11 DIAGNOSIS — N183 Chronic kidney disease, stage 3 unspecified: Secondary | ICD-10-CM

## 2018-08-11 LAB — POCT HEMOGLOBIN-HEMACUE: Hemoglobin: 12.4 g/dL — ABNORMAL LOW (ref 13.0–17.0)

## 2018-08-11 MED ORDER — EPOETIN ALFA-EPBX 10000 UNIT/ML IJ SOLN
20000.0000 [IU] | INTRAMUSCULAR | Status: DC
  Filled 2018-08-11: qty 2

## 2018-08-26 ENCOUNTER — Encounter (HOSPITAL_COMMUNITY)
Admission: RE | Admit: 2018-08-26 | Discharge: 2018-08-26 | Disposition: A | Discharge status: Home / Self Care | Payer: Medicare PPO | Source: Ambulatory Visit | Attending: Nephrology | Admitting: Nephrology

## 2018-08-26 VITALS — BP 161/85 | HR 80 | Temp 97.6°F | Resp 20

## 2018-08-26 DIAGNOSIS — D631 Anemia in chronic kidney disease: Secondary | ICD-10-CM | POA: Diagnosis not present

## 2018-08-26 DIAGNOSIS — N183 Chronic kidney disease, stage 3 unspecified: Secondary | ICD-10-CM

## 2018-08-26 DIAGNOSIS — N189 Chronic kidney disease, unspecified: Secondary | ICD-10-CM | POA: Diagnosis present

## 2018-08-26 LAB — IRON AND TIBC
IRON: 59 ug/dL (ref 45–182)
Saturation Ratios: 24 % (ref 17.9–39.5)
TIBC: 251 ug/dL (ref 250–450)
UIBC: 192 ug/dL

## 2018-08-26 LAB — POCT HEMOGLOBIN-HEMACUE: Hemoglobin: 12.3 g/dL — ABNORMAL LOW (ref 13.0–17.0)

## 2018-08-26 LAB — FERRITIN: Ferritin: 558 ng/mL — ABNORMAL HIGH (ref 24–336)

## 2018-08-26 MED ORDER — EPOETIN ALFA-EPBX 10000 UNIT/ML IJ SOLN
20000.0000 [IU] | INTRAMUSCULAR | Status: DC
  Filled 2018-08-26: qty 2

## 2018-09-08 ENCOUNTER — Encounter (HOSPITAL_COMMUNITY): Payer: Medicare PPO

## 2018-09-22 ENCOUNTER — Encounter (HOSPITAL_COMMUNITY): Payer: Medicare PPO

## 2018-10-07 ENCOUNTER — Other Ambulatory Visit: Payer: Self-pay

## 2018-10-07 MED ORDER — METOPROLOL SUCCINATE ER 50 MG PO TB24: ORAL_TABLET | ORAL | 0 refills | Status: DC

## 2018-10-11 MED ORDER — METOPROLOL SUCCINATE ER 50 MG PO TB24: ORAL_TABLET | ORAL | 0 refills | Status: DC

## 2018-10-11 NOTE — Addendum Note (Signed)
Addended by: Derl Barrow on: 10/11/2018 02:16 PM   Modules accepted: Orders

## 2018-12-08 ENCOUNTER — Encounter

## 2018-12-08 ENCOUNTER — Ambulatory Visit: Payer: BC Managed Care – PPO | Admitting: Interventional Cardiology

## 2018-12-11 NOTE — Progress Notes (Signed)
Cardiology Office Note:    Date:  12/12/2018   ID:  SYD NEWSOME, DOB [birth year], MRN [id number]  PCP:  Antony Contras, MD  Cardiologist:  Sinclair Grooms, MD   Referring MD: Antony Contras, MD   Chief Complaint  Patient presents with  . Congestive Heart Failure  . Coronary Artery Disease    History of Present Illness:    Aaron Carlson is a 85 y.o. male with a hx of CAD, documented diffuse vessel disease of diabetic variety by angiography, chronic combined systolic and diastolic heart failure, essential hypertension, chronic kidney disease stage 3-4, and elderly and frail.   Overall, Tayten feels stable compared to the last 6 and 12 months.  He has difficulty taking his trash can to the street and then walking back up an incline.  On the other hand he goes to the [facility] twice per week each time for 45 minutes to engage in aerobic activity.  He has been stable in terms of tolerance related to those activities.  Otherwise is relatively active.  He spends a good portion of each day moving around.  He has not required nitroglycerin sublingually.  He denies orthopnea, PND, and lower extremity swelling.  He is compliant with his current medical regimen.  He denies neurological complaints and claudication.    Past Medical History:  Diagnosis Date  . CAD in native artery 08/28/2013   Heaviness in chest with exertion. Coronary angiography 2005 demonstrated severe distal LAD and diagonal disease, severe distal RCA and PL OM disease, and severe first obtuse marginal disease. No high-grade proximal coronary disease was present at the time.  The most recent myocardial perfusion study in 2014 was nonischemic/low risk    . Chronic renal insufficiency   . Colon polyps    adenomatous  . Diabetes mellitus   . Hyperlipemia   . Hypertension   . Laryngeal papillomatosis   . Prostate cancer ([place])    w/ mets to bladder    Past Surgical History:  Procedure Laterality Date  .  BLADDER SURGERY    . CIRCUMCISION  2011  . POLYPECTOMY     vocal cords  . PROSTATE SURGERY      Current Medications: Current Meds  Medication Sig  . atorvastatin (LIPITOR) 20 MG tablet Take 20 mg by mouth daily.  . BD INSULIN SYRINGE ULTRAFINE 31G X 5/16" 0.5 ML MISC Use as directed  . benazepril (LOTENSIN) 20 MG tablet TAKE ONE TABLET BY MOUTH ONCE DAILY  . furosemide (LASIX) 40 MG tablet Take 0.5 tablets (20 mg total) by mouth daily.  . Garlic 580 MG TABS Take 1 tablet by mouth daily.   . insulin lispro (HUMALOG) 100 UNIT/ML injection Use as directed daily at bedtime as directed per sliding scale.  . insulin NPH Human (HUMULIN N,NOVOLIN N) 100 UNIT/ML injection Inject 45 Units into the skin at bedtime.  . Iron Combinations (CHROMAGEN) capsule Take 1 capsule by mouth daily.  . metoprolol succinate (TOPROL-XL) 50 MG 24 hr tablet Take 1 1/2 tablet by mouth daily. Please keep upcoming appt for future refills. Thank you  . Multiple Vitamin (MULTIVITAMIN) tablet Take 1 tablet by mouth daily.    . niacin (NIASPAN) 500 MG CR tablet Take 500 mg by mouth at bedtime.  . ONE TOUCH ULTRA TEST test strip Use as directed  . pantoprazole (PROTONIX) 40 MG tablet Take 1 tablet (40 mg total) by mouth daily.  . potassium chloride (KLOR-CON) 20 MEQ packet Take  by mouth daily.     Allergies:   Penicillins   Social History   Socioeconomic History  . Marital status: Married    Spouse name: Not on file  . Number of children: 4  . Years of education: Not on file  . Highest education level: Not on file  Occupational History  . Occupation: Retired  Scientific laboratory technician  . Financial resource strain: Not on file  . Food insecurity:    Worry: Not on file    Inability: Not on file  . Transportation needs:    Medical: Not on file    Non-medical: Not on file  Tobacco Use  . Smoking status: Former Research scientist (life sciences)  . Smokeless tobacco: Never Used  Substance and Sexual Activity  . Alcohol use: Yes    Comment: social   . Drug use: No  . Sexual activity: Not on file  Lifestyle  . Physical activity:    Days per week: Not on file    Minutes per session: Not on file  . Stress: Not on file  Relationships  . Social connections:    Talks on phone: Not on file    Gets together: Not on file    Attends religious service: Not on file    Active member of club or organization: Not on file    Attends meetings of clubs or organizations: Not on file    Relationship status: Not on file  Other Topics Concern  . Not on file  Social History Narrative  . Not on file     Family History: The patient's family history includes Breast cancer in his mother; Diabetes in his mother; Heart disease in his father; Heart failure in his father; Pancreatic cancer in his brother.  ROS:   Please see the history of present illness.    Diarrhea, snoring, abdominal pain, and leg pain if too much physical activity all other systems reviewed and are negative.  EKGs/Labs/Other Studies Reviewed:    The following studies were reviewed today: No new data  EKG:  EKG normal sinus rhythm, right bundle branch block, left anterior hemiblock, on today's tracing.  When compared to 12/01/2017, no significant change has occurred and the heart rate is slower.  Recent Labs: 08/26/2018: Hemoglobin 12.3  Recent Lipid Panel    Component Value Date/Time   CHOL 119 08/16/2006 0816   TRIG 101 08/16/2006 0816   HDL 42.4 08/16/2006 0816   CHOLHDL 2.8 CALC 08/16/2006 0816   VLDL 20 08/16/2006 0816   LDLCALC 56 08/16/2006 0816    Physical Exam:    VS:  BP 132/72   Pulse 67   Ht 5\' 8"  (1.727 m)   Wt 225 lb 9.6 oz (102.3 kg)   SpO2 98%   BMI 34.30 kg/m     Wt Readings from Last 3 Encounters:  12/12/18 225 lb 9.6 oz (102.3 kg)  08/03/18 220 lb (99.8 kg)  07/27/18 222 lb (100.7 kg)     GEN: Moderate obesity. No acute distress HEENT: Normal NECK: No JVD. LYMPHATICS: No lymphadenopathy CARDIAC: RRR.  No murmur, no gallop, no edema  VASCULAR: 2+ bilateral pulses, no bruits RESPIRATORY:  Clear to auscultation without rales, wheezing or rhonchi  ABDOMEN: Soft, non-tender, non-distended, No pulsatile mass, MUSCULOSKELETAL: No deformity  SKIN: Warm and dry NEUROLOGIC:  Alert and oriented x 3 PSYCHIATRIC:  Normal affect   ASSESSMENT:    1. Coronary artery disease involving native coronary artery of native heart with angina pectoris ([place])   2. Chronic  diastolic heart failure ([facility])   3. Essential hypertension   4. CKD (chronic kidney disease) stage 3, GFR 30-59 ml/min (H[facility])   5. Mixed hyperlipidemia   6. RBBB (right bundle branch block with left anterior fascicular block)    PLAN:    In order of problems listed above:  1. Stable with stable angina pectoris.  Secondary prevention discussed.  Encouraged increased volume of physical activity. 2. No evidence of volume overload. 3. Blood pressure control is very reasonable. 4. He is followed by [facility].  He is being seen twice yearly.  Kidney disease is been relatively stable. 5. LDL is 44.  Continue current therapy. 6. EKG is unchanged.  Overall education and awareness concerning primary/secondary risk prevention was discussed in detail: LDL less than 70, hemoglobin A1c less than 7, blood pressure target less than 130/80 mmHg, >150 minutes of moderate aerobic activity per week, avoidance of smoking, weight control (via diet and exercise), and continued surveillance/management of/for obstructive sleep apnea.   Clinical follow-up in 1 year   Medication Adjustments/Labs and Tests Ordered: Current medicines are reviewed at length with the patient today.  Concerns regarding medicines are outlined above.  No orders of the defined types were placed in this encounter.  No orders of the defined types were placed in this encounter.   There are no Patient Instructions on file for this visit.   Signed, Sinclair Grooms, MD  12/12/2018 8:42 AM    [place]

## 2018-12-12 ENCOUNTER — Other Ambulatory Visit: Payer: Self-pay

## 2018-12-12 ENCOUNTER — Ambulatory Visit (INDEPENDENT_AMBULATORY_CARE_PROVIDER_SITE_OTHER): Payer: Medicare Other | Admitting: Interventional Cardiology

## 2018-12-12 ENCOUNTER — Encounter: Payer: Self-pay | Admitting: Interventional Cardiology

## 2018-12-12 VITALS — BP 132/72 | HR 67 | Ht 68.0 in | Wt 225.6 lb

## 2018-12-12 DIAGNOSIS — N183 Chronic kidney disease, stage 3 unspecified: Secondary | ICD-10-CM

## 2018-12-12 DIAGNOSIS — E782 Mixed hyperlipidemia: Secondary | ICD-10-CM

## 2018-12-12 DIAGNOSIS — K2970 Gastritis, unspecified, without bleeding: Secondary | ICD-10-CM | POA: Insufficient documentation

## 2018-12-12 DIAGNOSIS — R140 Abdominal distension (gaseous): Secondary | ICD-10-CM | POA: Insufficient documentation

## 2018-12-12 DIAGNOSIS — I25119 Atherosclerotic heart disease of native coronary artery with unspecified angina pectoris: Secondary | ICD-10-CM

## 2018-12-12 DIAGNOSIS — I452 Bifascicular block: Secondary | ICD-10-CM

## 2018-12-12 DIAGNOSIS — I10 Essential (primary) hypertension: Secondary | ICD-10-CM

## 2018-12-12 DIAGNOSIS — I5032 Chronic diastolic (congestive) heart failure: Secondary | ICD-10-CM

## 2018-12-12 NOTE — Patient Instructions (Signed)
Medication Instructions:  Your physician recommends that you continue on your current medications as directed. Please refer to the Current Medication list given to you today.  If you need a refill on your cardiac medications before your next appointment, please call your pharmacy.   Lab work: None If you have labs (blood work) drawn today and your tests are completely normal, you will receive your results only by: . MyChart Message (if you have MyChart) OR . A paper copy in the mail If you have any lab test that is abnormal or we need to change your treatment, we will call you to review the results.  Testing/Procedures: None  Follow-Up: At [facility], you and your health needs are our priority.  As part of our continuing mission to provide you with exceptional heart care, we have created designated Provider Care Teams.  These Care Teams include your primary Cardiologist (physician) and Advanced Practice Providers (APPs -  Physician Assistants and Nurse Practitioners) who all work together to provide you with the care you need, when you need it. You will need a follow up appointment in 9-12 months.  Please call our office 2 months in advance to schedule this appointment.  You may see Henry W Smith III, MD or one of the following Advanced Practice Providers on your designated Care Team:   Lori Gerhardt, NP Laura Ingold, NP . Jill McDaniel, NP  Any Other Special Instructions Will Be Listed Below (If Applicable).    

## 2019-01-18 ENCOUNTER — Other Ambulatory Visit: Payer: Self-pay | Admitting: Interventional Cardiology

## 2019-02-23 ENCOUNTER — Telehealth: Payer: Self-pay

## 2019-02-23 NOTE — Telephone Encounter (Signed)
Called patient to inform him that disability placard application had been signed. Mailed to patient at [place], [place] [address]

## 2019-06-01 NOTE — Progress Notes (Signed)
Cardiology Office Note   Date:  06/07/2019   ID:  Aaron Carlson, DOB [birth year], MRN [id number]  PCP:  Aaron Contras, MD  Cardiologist: Dr. Daneen Schick, MD   Chief Complaint  Patient presents with  . Follow-up    History of Present Illness: Aaron Carlson is a 85 y.o. male with a hx of CAD, documented diffuse 3 vessel disease by angiography in 2005, chronic combined systolic and diastolic heart failure, essential hypertension and chronic kidney disease stage III-IV.   He was last seen by Dr. Tamala Carlson on 12/12/2018 and was noted to be doing fairly well when compared to previous visits over the last 6-12 months. He would have some difficulty with taking his trash can out to the road but would also go to the [employer] twice per week and engage in aerobic activities.  He did not require sublingual nitroglycerin and had no heart failure symptoms.  Today he presents for follow up and states he continues to have mild anginal symptoms with pulling out trash cans. Since COVID-19 his exercise regimen has diminished.  He still continues to walk, do yard work and indoor work however is not doing as much aerobic activity.  He states when pulling his trash cans to the curb he will sometimes have anginal symptoms in which he stops and the pain goes away and he continues on his way.  He has not taken SL NTG.  We discussed adding a long-acting Imdur and he is interested at this time.  We discussed side effects and he is to call if he is unable to tolerate headache.  BP is stable today.  He reports some leg cramping at night while sleeping in which he gets up and puts what sounds like pain cream with relief.  He has no claudication symptoms.  Leg pain only occurs while sleeping.  Almost sounds like restless leg.  We discussed if lab work is normal and in no improvement may benefit from LE Doppler studies.  Denies shortness of breath, palpitations, LE swelling, orthopnea, dizziness or syncope.   Past Medical  History:  Diagnosis Date  . CAD in native artery 08/28/2013   Heaviness in chest with exertion. Coronary angiography 2005 demonstrated severe distal LAD and diagonal disease, severe distal RCA and PL OM disease, and severe first obtuse marginal disease. No high-grade proximal coronary disease was present at the time.  The most recent myocardial perfusion study in 2014 was nonischemic/low risk    . Chronic renal insufficiency   . Colon polyps    adenomatous  . Diabetes mellitus   . Hyperlipemia   . Hypertension   . Laryngeal papillomatosis   . Prostate cancer ([place])    w/ mets to bladder    Past Surgical History:  Procedure Laterality Date  . BLADDER SURGERY    . CIRCUMCISION  2011  . POLYPECTOMY     vocal cords  . PROSTATE SURGERY       Current Outpatient Medications  Medication Sig Dispense Refill  . atorvastatin (LIPITOR) 20 MG tablet Take 20 mg by mouth daily.  0  . BD INSULIN SYRINGE ULTRAFINE 31G X 5/16" 0.5 ML MISC Use as directed    . benazepril (LOTENSIN) 20 MG tablet TAKE ONE TABLET BY MOUTH ONCE DAILY 90 tablet 3  . furosemide (LASIX) 80 MG tablet Take 1 tablet by mouth daily.    . Garlic XX123456 MG TABS Take 1 tablet by mouth daily.     Marland Kitchen  insulin lispro (HUMALOG) 100 UNIT/ML injection Use as directed daily at bedtime as directed per sliding scale.    . insulin NPH Human (HUMULIN N,NOVOLIN N) 100 UNIT/ML injection Inject 45 Units into the skin at bedtime.    . Iron Combinations (CHROMAGEN) capsule Take 1 capsule by mouth daily.    . metoprolol succinate (TOPROL-XL) 50 MG 24 hr tablet TAKE 1 AND 1/2 TABLETS BY  MOUTH DAILY 135 tablet 3  . Multiple Vitamin (MULTIVITAMIN) tablet Take 1 tablet by mouth daily.      . niacin (NIASPAN) 500 MG CR tablet Take 500 mg by mouth at bedtime.    Marland Kitchen omeprazole (PRILOSEC) 20 MG capsule Take 20 mg by mouth daily.    . ONE TOUCH ULTRA TEST test strip Use as directed    . potassium chloride (KLOR-CON) 20 MEQ packet Take by mouth daily.      No current facility-administered medications for this visit.     Allergies:   Penicillins    Social History:  The patient  reports that he has quit smoking. He has never used smokeless tobacco. He reports current alcohol use. He reports that he does not use drugs.   Family History:  The patient's family history includes Breast cancer in his mother; Diabetes in his mother; Heart disease in his father; Heart failure in his father; Pancreatic cancer in his brother.    ROS:  Please see the history of present illness.  Otherwise, review of systems are positive for none.   All other systems are reviewed and negative.    PHYSICAL EXAM: VS:  BP 126/72   Pulse 64   Ht 5\' 8"  (1.727 m)   Wt 230 lb 12.8 oz (104.7 kg)   SpO2 98%   BMI 35.09 kg/m  , BMI Body mass index is 35.09 kg/m.   General: Elderly, NAD Neck: Negative for carotid bruits. No JVD Lungs:Clear to ausculation bilaterally. No wheezes, rales, or rhonchi. Breathing is unlabored. Cardiovascular: RRR with S1 S2. No murmur Extremities: No edema. No clubbing or cyanosis. DP pulses 1+ bilaterally Neuro: Alert and oriented. No focal deficits. No facial asymmetry. [name] spontaneously. Psych: Responds to questions appropriately with normal affect.     EKG:  EKG is not ordered today.   Recent Labs: 08/26/2018: Hemoglobin 12.3    Lipid Panel    Component Value Date/Time   CHOL 119 08/16/2006 0816   TRIG 101 08/16/2006 0816   HDL 42.4 08/16/2006 0816   CHOLHDL 2.8 CALC 08/16/2006 0816   VLDL 20 08/16/2006 0816   LDLCALC 56 08/16/2006 0816     Wt Readings from Last 3 Encounters:  06/07/19 230 lb 12.8 oz (104.7 kg)  12/12/18 225 lb 9.6 oz (102.3 kg)  08/03/18 220 lb (99.8 kg)    Other studies Reviewed: Additional studies/ records that were reviewed today include:   Myocardial perfusion imaging 09/18/2013: -No significant abnormality noted on nuclear study -See scanned study into epic  ASSESSMENT AND PLAN:  1. CAD:  -Stable, has some mild exertional angina relieved with rest -Not interested in pursuing invasive testing, will add low-dose long-acting nitrate and follow -Continue with secondary prevention  -Continue Toprol 50 mg daily, ASA, atorvastatin, benazepril  2. Chronic diastolic heart failure: -Denies shortness of breath, LE swelling -Continue Toprol, benazepril, Lasix -Check labs today given Lasix and benazepril  3.  DM2: -Per PCP -Continue current regimen with insulin   Current medicines are reviewed at length with the patient today.  The patient does not  have concerns regarding medicines.  The following changes have been made: Add Imdur 15 mg daily  Labs/ tests ordered today include: BMET No orders of the defined types were placed in this encounter.  Disposition:   FU with Dr. Tamala Carlson in 6 months  Signed, Kathyrn Drown, NP  06/07/2019 11:30 AM    [address], [place], [place]  [address] Phone: [phone]; Fax: [phone]

## 2019-06-07 ENCOUNTER — Encounter: Payer: Self-pay | Admitting: Cardiology

## 2019-06-07 ENCOUNTER — Ambulatory Visit (INDEPENDENT_AMBULATORY_CARE_PROVIDER_SITE_OTHER): Payer: Medicare Other | Admitting: Cardiology

## 2019-06-07 ENCOUNTER — Other Ambulatory Visit: Payer: Self-pay

## 2019-06-07 ENCOUNTER — Other Ambulatory Visit: Payer: Self-pay | Admitting: Cardiology

## 2019-06-07 VITALS — BP 126/72 | HR 64 | Ht 68.0 in | Wt 230.8 lb

## 2019-06-07 DIAGNOSIS — I5032 Chronic diastolic (congestive) heart failure: Secondary | ICD-10-CM

## 2019-06-07 DIAGNOSIS — I10 Essential (primary) hypertension: Secondary | ICD-10-CM | POA: Diagnosis not present

## 2019-06-07 DIAGNOSIS — I25119 Atherosclerotic heart disease of native coronary artery with unspecified angina pectoris: Secondary | ICD-10-CM | POA: Diagnosis not present

## 2019-06-07 LAB — BASIC METABOLIC PANEL
BUN/Creatinine Ratio: 18 (ref 10–24)
BUN: 36 mg/dL — ABNORMAL HIGH (ref 8–27)
CO2: 22 mmol/L (ref 20–29)
Calcium: 9.5 mg/dL (ref 8.6–10.2)
Chloride: 106 mmol/L (ref 96–106)
Creatinine, Ser: 1.97 mg/dL — ABNORMAL HIGH (ref 0.76–1.27)
GFR calc Af Amer: 35 mL/min/{1.73_m2} — ABNORMAL LOW (ref >59)
GFR calc non Af Amer: 30 mL/min/{1.73_m2} — ABNORMAL LOW (ref >59)
Glucose: 97 mg/dL (ref 65–99)
Potassium: 3.8 mmol/L (ref 3.5–5.2)
Sodium: 141 mmol/L (ref 134–144)

## 2019-06-07 MED ORDER — ASPIRIN EC 81 MG PO TBEC: 81.0000 mg | DELAYED_RELEASE_TABLET | Freq: Every day | ORAL | 3 refills | Status: DC

## 2019-06-07 MED ORDER — ASPIRIN 81 MG PO CHEW: 81.0000 mg | CHEWABLE_TABLET | Freq: Once | ORAL | Status: DC

## 2019-06-07 MED ORDER — ISOSORBIDE MONONITRATE ER 30 MG PO TB24: 15.0000 mg | ORAL_TABLET | Freq: Every day | ORAL | 3 refills | Status: DC

## 2019-06-07 NOTE — Patient Instructions (Addendum)
Medication Instructions:   Your physician has recommended you make the following change in your medication:   1) Start Imdur 30MG , 1/2 tablet by mouth once a day  If you need a refill on your cardiac medications before your next appointment, please call your pharmacy.   Lab work:  You will have labs drawn today: BMET  If you have labs (blood work) drawn today and your tests are completely normal, you will receive your results only by: Marland Kitchen MyChart Message (if you have MyChart) OR . A paper copy in the mail If you have any lab test that is abnormal or we need to change your treatment, we will call you to review the results.  Testing/Procedures:  None ordered today  Follow-Up: At [facility], you and your health needs are our priority.  As part of our continuing mission to provide you with exceptional heart care, we have created designated Provider Care Teams.  These Care Teams include your primary Cardiologist (physician) and Advanced Practice Providers (APPs -  Physician Assistants and Nurse Practitioners) who all work together to provide you with the care you need, when you need it. You will need a follow up appointment in 6 months.  Please call our office 2 months in advance to schedule this appointment.  You may see Sinclair Grooms, MD or one of the following Advanced Practice Providers on your designated Care Team:   Truitt Merle, NP Cecilie Kicks, NP . Kathyrn Drown, NP

## 2019-06-08 ENCOUNTER — Telehealth: Payer: Self-pay

## 2019-06-08 NOTE — Telephone Encounter (Signed)
Notes recorded by Frederik Schmidt, RN on 06/08/2019 at 1:44 PM EDT  The patient has been notified of the result and verbalized understanding. All questions (if any) were answered.  Frederik Schmidt, RN 06/08/2019 1:44 PM

## 2019-06-08 NOTE — Telephone Encounter (Signed)
-----   Message from Tommie Raymond, NP sent at 06/08/2019  1:40 PM EDT ----- Please let the patient know that his labs are stable from previous labs. No changes at this time

## 2019-07-14 ENCOUNTER — Emergency Department (HOSPITAL_COMMUNITY)
Admission: EM | Admit: 2019-07-14 | Discharge: 2019-07-14 | Disposition: A | Discharge status: Home / Self Care | Payer: Medicare Other | Attending: Emergency Medicine | Admitting: Emergency Medicine

## 2019-07-14 DIAGNOSIS — Z5321 Procedure and treatment not carried out due to patient leaving prior to being seen by health care provider: Secondary | ICD-10-CM | POA: Insufficient documentation

## 2019-07-14 DIAGNOSIS — R300 Dysuria: Secondary | ICD-10-CM | POA: Insufficient documentation

## 2019-07-14 DIAGNOSIS — R1031 Right lower quadrant pain: Secondary | ICD-10-CM | POA: Diagnosis present

## 2019-07-14 LAB — CBC
HCT: 36.1 % — ABNORMAL LOW (ref 39.0–52.0)
Hemoglobin: 11.6 g/dL — ABNORMAL LOW (ref 13.0–17.0)
MCH: 29.7 pg (ref 26.0–34.0)
MCHC: 32.1 g/dL (ref 30.0–36.0)
MCV: 92.3 fL (ref 80.0–100.0)
Platelets: 170 10*3/uL (ref 150–400)
RBC: 3.91 MIL/uL — ABNORMAL LOW (ref 4.22–5.81)
RDW: 13.4 % (ref 11.5–15.5)
WBC: 11.4 10*3/uL — ABNORMAL HIGH (ref 4.0–10.5)
nRBC: 0 % (ref 0.0–0.2)

## 2019-07-14 LAB — COMPREHENSIVE METABOLIC PANEL
ALT: 25 U/L (ref 0–44)
AST: 23 U/L (ref 15–41)
Albumin: 4.1 g/dL (ref 3.5–5.0)
Alkaline Phosphatase: 93 U/L (ref 38–126)
Anion gap: 10 (ref 5–15)
BUN: 45 mg/dL — ABNORMAL HIGH (ref 8–23)
CO2: 25 mmol/L (ref 22–32)
Calcium: 9.1 mg/dL (ref 8.9–10.3)
Chloride: 102 mmol/L (ref 98–111)
Creatinine, Ser: 2.25 mg/dL — ABNORMAL HIGH (ref 0.61–1.24)
GFR calc Af Amer: 30 mL/min — ABNORMAL LOW (ref >60)
GFR calc non Af Amer: 26 mL/min — ABNORMAL LOW (ref >60)
Glucose, Bld: 151 mg/dL — ABNORMAL HIGH (ref 70–99)
Potassium: 3.6 mmol/L (ref 3.5–5.1)
Sodium: 137 mmol/L (ref 135–145)
Total Bilirubin: 0.9 mg/dL (ref 0.3–1.2)
Total Protein: 6.9 g/dL (ref 6.5–8.1)

## 2019-07-14 LAB — URINALYSIS, ROUTINE W REFLEX MICROSCOPIC
Bilirubin Urine: NEGATIVE
Glucose, UA: NEGATIVE mg/dL
Ketones, ur: NEGATIVE mg/dL
Nitrite: NEGATIVE
Protein, ur: NEGATIVE mg/dL
Specific Gravity, Urine: 1.006 (ref 1.005–1.030)
pH: 8 (ref 5.0–8.0)

## 2019-07-14 LAB — LIPASE, BLOOD: Lipase: 33 U/L (ref 11–51)

## 2019-07-14 MED ORDER — SODIUM CHLORIDE 0.9% FLUSH: 3.0000 mL | Freq: Once | INTRAVENOUS | Status: DC

## 2019-07-14 NOTE — ED Notes (Signed)
Called pt 3 times in lobby with no answer, RN Ronalee Belts P. Called CT but pt is not there either

## 2019-07-14 NOTE — ED Triage Notes (Signed)
Pt reports RLQ pain since this morning right after eating- pt states this worried him because he has an "artifical bladder" per pt and it was also burning with urination.

## 2019-12-03 NOTE — Progress Notes (Signed)
Cardiology Office Note:    Date:  12/04/2019   ID:  COCHISE WESP, DOB 08/20/1933, MRN [id number]  PCP:  Antony Contras, MD  Cardiologist:  Sinclair Grooms, MD   Referring MD: Antony Contras, MD   Chief Complaint  Patient presents with  . Coronary Artery Disease    Angina  . Congestive Heart Failure    History of Present Illness:    [facility] is a 86 y.o. male with a hx of  CAD, documented diffuse vessel disease of diabetic variety by angiography, chronic combined systolic and diastolic heart failure, essential hypertension, chronic kidney disease stage 3-4, and elderly and frail.ER visit 06/2019 for RLQ pain, left before being seen.  [name] has received both doses of the Pfizer vaccine.  He still complains of tightness in the chest with moderate activity such as taking the trash cans to the curbside or repetitive moderate lifting.  Otherwise he does okay.  He compensates by resting before symptoms get bad.  Symptoms do not occur at rest.  He denies orthopnea, PND, and ankle edema.  He is sleeping without difficulty.  Past Medical History:  Diagnosis Date  . CAD in native artery 08/28/2013   Heaviness in chest with exertion. Coronary angiography 2005 demonstrated severe distal LAD and diagonal disease, severe distal RCA and PL OM disease, and severe first obtuse marginal disease. No high-grade proximal coronary disease was present at the time.  The most recent myocardial perfusion study in 2014 was nonischemic/low risk    . Chronic renal insufficiency   . Colon polyps    adenomatous  . Diabetes mellitus   . Hyperlipemia   . Hypertension   . Laryngeal papillomatosis   . Prostate cancer ([place])    w/ mets to bladder    Past Surgical History:  Procedure Laterality Date  . BLADDER SURGERY    . CIRCUMCISION  2011  . POLYPECTOMY     vocal cords  . PROSTATE SURGERY      Current Medications: Current Meds  Medication Sig  . aspirin EC 81 MG tablet Take 1 tablet (81 mg  total) by mouth daily.  Marland Kitchen atorvastatin (LIPITOR) 20 MG tablet Take 20 mg by mouth daily.  . BD INSULIN SYRINGE ULTRAFINE 31G X 5/16" 0.5 ML MISC Use as directed  . benazepril (LOTENSIN) 20 MG tablet TAKE ONE TABLET BY MOUTH ONCE DAILY  . furosemide (LASIX) 80 MG tablet Take 1 tablet by mouth daily.  . Garlic [id number] MG TABS Take 1 tablet by mouth daily.   . insulin lispro (HUMALOG) 100 UNIT/ML injection Use as directed daily at bedtime as directed per sliding scale.  . insulin NPH Human (HUMULIN N,NOVOLIN N) 100 UNIT/ML injection Inject 45 Units into the skin at bedtime.  . Iron Combinations (CHROMAGEN) capsule Take 1 capsule by mouth daily.  . isosorbide mononitrate (IMDUR) 30 MG 24 hr tablet Take 1 tablet (30 mg total) by mouth daily.  . metoprolol succinate (TOPROL-XL) 50 MG 24 hr tablet TAKE 1 AND 1/2 TABLETS BY  MOUTH DAILY  . Multiple Vitamin (MULTIVITAMIN) tablet Take 1 tablet by mouth daily.    . niacin (NIASPAN) 500 MG CR tablet Take 500 mg by mouth at bedtime.  Marland Kitchen omeprazole (PRILOSEC) 20 MG capsule Take 20 mg by mouth daily.  . ONE TOUCH ULTRA TEST test strip Use as directed  . potassium chloride (KLOR-CON) 20 MEQ packet Take by mouth daily.  . [DISCONTINUED] isosorbide mononitrate (IMDUR) 30 MG 24 hr  tablet Take 0.5 tablets (15 mg total) by mouth daily.     Allergies:   Penicillins   Social History   Socioeconomic History  . Marital status: Married    Spouse name: Not on file  . Number of children: 4  . Years of education: Not on file  . Highest education level: Not on file  Occupational History  . Occupation: Retired  Tobacco Use  . Smoking status: Former Research scientist (life sciences)  . Smokeless tobacco: Never Used  Substance and Sexual Activity  . Alcohol use: Yes    Comment: social  . Drug use: No  . Sexual activity: Not on file  Other Topics Concern  . Not on file  Social History Narrative  . Not on file   Social Determinants of Health   Financial Resource Strain:   . Difficulty  of Paying Living Expenses: Not on file  Food Insecurity:   . Worried About Charity fundraiser in the Last Year: Not on file  . Ran Out of Food in the Last Year: Not on file  Transportation Needs:   . Lack of Transportation (Medical): Not on file  . Lack of Transportation (Non-Medical): Not on file  Physical Activity:   . Days of Exercise per Week: Not on file  . Minutes of Exercise per Session: Not on file  Stress:   . Feeling of Stress : Not on file  Social Connections:   . Frequency of Communication with Friends and Family: Not on file  . Frequency of Social Gatherings with Friends and Family: Not on file  . Attends Religious Services: Not on file  . Active Member of Clubs or Organizations: Not on file  . Attends Archivist Meetings: Not on file  . Marital Status: Not on file     Family History: The patient's family history includes Breast cancer in his mother; Diabetes in his mother; Heart disease in his father; Heart failure in his father; Pancreatic cancer in his brother.  ROS:   Please see the history of present illness.    Kidney disease is significant with a creatinine of 2.3 which is consistent with stage IV.  He used to follow with Dr. Florene Glen.  He will be seeing a another nephrologist within [facility].  Adding very low-dose isosorbide did not improve angina..  All other systems reviewed and are negative.  EKGs/Labs/Other Studies Reviewed:    The following studies were reviewed today: No new data.  EKG:  EKG sinus rhythm, left atrial abnormality, left anterior hemiblock, right bundle branch block, and when compared to prior tracings from March 2020, no changes noted.  Recent Labs: 07/14/2019: ALT 25; BUN 45; Creatinine, Ser 2.25; Hemoglobin 11.6; Platelets 170; Potassium 3.6; Sodium 137  Recent Lipid Panel    Component Value Date/Time   CHOL 119 08/16/2006 0816   TRIG 101 08/16/2006 0816   HDL 42.4 08/16/2006 0816   CHOLHDL 2.8 CALC  08/16/2006 0816   VLDL 20 08/16/2006 0816   LDLCALC 56 08/16/2006 0816    Physical Exam:    VS:  BP 124/68   Pulse 75   Ht 5\' 8"  (1.727 m)   Wt 233 lb 6.4 oz (105.9 kg)   SpO2 98%   BMI 35.49 kg/m     Wt Readings from Last 3 Encounters:  12/04/19 233 lb 6.4 oz (105.9 kg)  06/07/19 230 lb 12.8 oz (104.7 kg)  12/12/18 225 lb 9.6 oz (102.3 kg)     GEN: Significant abdominal  obesity.  BMI 36.. No acute distress HEENT: Normal NECK: No JVD. LYMPHATICS: No lymphadenopathy CARDIAC:  RRR without murmur, gallop, or edema. VASCULAR:  Normal Pulses. No bruits. RESPIRATORY:  Clear to auscultation without rales, wheezing or rhonchi  ABDOMEN: Soft, non-tender, non-distended, No pulsatile mass, MUSCULOSKELETAL: No deformity  SKIN: Warm and dry NEUROLOGIC:  Alert and oriented x 3 PSYCHIATRIC:  Normal affect   ASSESSMENT:    1. Coronary artery disease involving native coronary artery of native heart with angina pectoris ([place])   2. Essential hypertension   3. Chronic diastolic heart failure ([place])   4. Mixed hyperlipidemia   5. Stage 3b chronic kidney disease   6. RBBB (right bundle branch block with left anterior fascicular block)   7. Educated about COVID-19 virus infection   8. CKD (chronic kidney disease), stage IV ([facility])    PLAN:    In order of problems listed above:  1. Secondary prevention discussed.  Impact of long-acting nitrates on anginal symptoms discussed.  We will further titrate isosorbide to 30 mg/day and if still present with normal vital signs in 6 months, will increase to at least 60 mg/day.  He is encouraged to report spontaneous angina and change in anginal pattern. 2. Blood pressure target at his age is 140/80 mmHg or below.  Need to keep systolic blood pressure greater than 100 mmHg.  Benazepril, furosemide, metoprolol, will be continued. 3. Consider adding a mineralocorticoid inhibitor although with stage III/IV CKD, this would be somewhat risky relative to  potassium homeostasis. 4. Continue Lipitor 20 mg/day.  Target is les than 75 . 5. Being followed by the [facility].  Previously Dr. Florene Glen. 6. No change in pattern 7. He has received the vaccine.  Social distancing, masking, and handwashing is being applied.   Overall education and awareness concerning primary/secondary risk prevention was discussed in detail: LDL less than 70, hemoglobin A1c less than 7, blood pressure target less than 130/80 mmHg, >150 minutes of moderate aerobic activity per week, avoidance of smoking, weight control (via diet and exercise), and continued surveillance/management of/for obstructive sleep apnea.  On next visit, further uptitrate isosorbide if still having symptom limiting angina  Medication Adjustments/Labs and Tests Ordered: Current medicines are reviewed at length with the patient today.  Concerns regarding medicines are outlined above.  Orders Placed This Encounter  Procedures  . EKG 12-Lead   Meds ordered this encounter  Medications  . isosorbide mononitrate (IMDUR) 30 MG 24 hr tablet    Sig: Take 1 tablet (30 mg total) by mouth daily.    Dispense:  90 tablet    Refill:  3    Dose change    Patient Instructions  Medication Instructions:  1) INCREASE Imdur to 30mg  once daily  *If you need a refill on your cardiac medications before your next appointment, please call your pharmacy*   Lab Work: None If you have labs (blood work) drawn today and your tests are completely normal, you will receive your results only by: Marland Kitchen MyChart Message (if you have MyChart) OR . A paper copy in the mail If you have any lab test that is abnormal or we need to change your treatment, we will call you to review the results.   Testing/Procedures: None   Follow-Up: At [facility], you and your health needs are our priority.  As part of our continuing mission to provide you with exceptional heart care, we have created designated Provider Care  Teams.  These Care Teams include  your primary Cardiologist (physician) and Advanced Practice Providers (APPs -  Physician Assistants and Nurse Practitioners) who all work together to provide you with the care you need, when you need it.  We recommend signing up for the patient portal called "MyChart".  Sign up information is provided on this After Visit Summary.  MyChart is used to connect with patients for Virtual Visits (Telemedicine).  Patients are able to view lab/test results, encounter notes, upcoming appointments, etc.  Non-urgent messages can be sent to your provider as well.   To learn more about what you can do with MyChart, go to [email or web address].    Your next appointment:   6 month(s)  The format for your next appointment:   In Person  Provider:   You may see Sinclair Grooms, MD or one of the following Advanced Practice Providers on your designated Care Team:    Truitt Merle, NP  Cecilie Kicks, NP  Kathyrn Drown, NP    Other Instructions      Signed, Sinclair Grooms, MD  12/04/2019 11:28 AM    [place]

## 2019-12-04 ENCOUNTER — Other Ambulatory Visit: Payer: Self-pay

## 2019-12-04 ENCOUNTER — Encounter: Payer: Self-pay | Admitting: Interventional Cardiology

## 2019-12-04 ENCOUNTER — Ambulatory Visit: Payer: Medicare Other | Admitting: Interventional Cardiology

## 2019-12-04 VITALS — BP 124/68 | HR 75 | Ht 68.0 in | Wt 233.4 lb

## 2019-12-04 DIAGNOSIS — E782 Mixed hyperlipidemia: Secondary | ICD-10-CM

## 2019-12-04 DIAGNOSIS — I10 Essential (primary) hypertension: Secondary | ICD-10-CM | POA: Diagnosis not present

## 2019-12-04 DIAGNOSIS — I25119 Atherosclerotic heart disease of native coronary artery with unspecified angina pectoris: Secondary | ICD-10-CM | POA: Diagnosis not present

## 2019-12-04 DIAGNOSIS — Z7189 Other specified counseling: Secondary | ICD-10-CM

## 2019-12-04 DIAGNOSIS — N1832 Chronic kidney disease, stage 3b: Secondary | ICD-10-CM

## 2019-12-04 DIAGNOSIS — I5032 Chronic diastolic (congestive) heart failure: Secondary | ICD-10-CM

## 2019-12-04 DIAGNOSIS — I452 Bifascicular block: Secondary | ICD-10-CM

## 2019-12-04 MED ORDER — ISOSORBIDE MONONITRATE ER 30 MG PO TB24: 30.0000 mg | ORAL_TABLET | Freq: Every day | ORAL | 3 refills | Status: DC

## 2019-12-04 NOTE — Patient Instructions (Signed)
Medication Instructions:  1) INCREASE Imdur to 30mg  once daily  *If you need a refill on your cardiac medications before your next appointment, please call your pharmacy*   Lab Work: None If you have labs (blood work) drawn today and your tests are completely normal, you will receive your results only by: Marland Kitchen MyChart Message (if you have MyChart) OR . A paper copy in the mail If you have any lab test that is abnormal or we need to change your treatment, we will call you to review the results.   Testing/Procedures: None   Follow-Up: At [facility], you and your health needs are our priority.  As part of our continuing mission to provide you with exceptional heart care, we have created designated Provider Care Teams.  These Care Teams include your primary Cardiologist (physician) and Advanced Practice Providers (APPs -  Physician Assistants and Nurse Practitioners) who all work together to provide you with the care you need, when you need it.  We recommend signing up for the patient portal called "MyChart".  Sign up information is provided on this After Visit Summary.  MyChart is used to connect with patients for Virtual Visits (Telemedicine).  Patients are able to view lab/test results, encounter notes, upcoming appointments, etc.  Non-urgent messages can be sent to your provider as well.   To learn more about what you can do with MyChart, go to [email or web address].    Your next appointment:   6 month(s)  The format for your next appointment:   In Person  Provider:   You may see Sinclair Grooms, MD or one of the following Advanced Practice Providers on your designated Care Team:    Truitt Merle, NP  Cecilie Kicks, NP  Kathyrn Drown, NP    Other Instructions

## 2019-12-08 ENCOUNTER — Other Ambulatory Visit: Payer: Self-pay | Admitting: Interventional Cardiology

## 2019-12-27 ENCOUNTER — Telehealth: Payer: Self-pay | Admitting: Interventional Cardiology

## 2019-12-27 MED ORDER — BENAZEPRIL HCL 20 MG PO TABS: 20.0000 mg | ORAL_TABLET | Freq: Every day | ORAL | 3 refills | Status: DC

## 2019-12-27 NOTE — Telephone Encounter (Signed)
[name] from [facility] is calling requesting a nurse call their office to confirm [facility] is still supposed to be taking benazepril (LOTENSIN) 20 MG tablet. Please advise.

## 2019-12-27 NOTE — Telephone Encounter (Signed)
Left message on [facility]son's confirmed VM at [facility] letting her know that pt should be taking Benazepril.  Advised to call back if any questions.

## 2019-12-27 NOTE — Telephone Encounter (Signed)
New message   Pt c/o medication issue:  1. Name of Medication: benazepril (LOTENSIN) 20 MG tablet  2. How are you currently taking this medication (dosage and times per day)? As written  3. Are you having a reaction (difficulty breathing--STAT)?n/a  4. What is your medication issue? Per [facility] the patient needs a new prescription for this medication sent to [place], [address]

## 2019-12-27 NOTE — Telephone Encounter (Signed)
Prescription sent

## 2020-01-10 DIAGNOSIS — H1045 Other chronic allergic conjunctivitis: Secondary | ICD-10-CM | POA: Diagnosis not present

## 2020-01-10 DIAGNOSIS — E1122 Type 2 diabetes mellitus with diabetic chronic kidney disease: Secondary | ICD-10-CM | POA: Diagnosis not present

## 2020-01-10 DIAGNOSIS — E119 Type 2 diabetes mellitus without complications: Secondary | ICD-10-CM | POA: Diagnosis not present

## 2020-01-10 DIAGNOSIS — D649 Anemia, unspecified: Secondary | ICD-10-CM | POA: Diagnosis not present

## 2020-01-10 DIAGNOSIS — H25813 Combined forms of age-related cataract, bilateral: Secondary | ICD-10-CM | POA: Diagnosis not present

## 2020-01-10 DIAGNOSIS — Z8546 Personal history of malignant neoplasm of prostate: Secondary | ICD-10-CM | POA: Diagnosis not present

## 2020-01-10 DIAGNOSIS — E7800 Pure hypercholesterolemia, unspecified: Secondary | ICD-10-CM | POA: Diagnosis not present

## 2020-01-10 DIAGNOSIS — N183 Chronic kidney disease, stage 3 unspecified: Secondary | ICD-10-CM | POA: Diagnosis not present

## 2020-01-10 DIAGNOSIS — E1165 Type 2 diabetes mellitus with hyperglycemia: Secondary | ICD-10-CM | POA: Diagnosis not present

## 2020-01-10 DIAGNOSIS — H401131 Primary open-angle glaucoma, bilateral, mild stage: Secondary | ICD-10-CM | POA: Diagnosis not present

## 2020-01-10 DIAGNOSIS — H43822 Vitreomacular adhesion, left eye: Secondary | ICD-10-CM | POA: Diagnosis not present

## 2020-01-10 DIAGNOSIS — E785 Hyperlipidemia, unspecified: Secondary | ICD-10-CM | POA: Diagnosis not present

## 2020-01-10 DIAGNOSIS — E1129 Type 2 diabetes mellitus with other diabetic kidney complication: Secondary | ICD-10-CM | POA: Diagnosis not present

## 2020-01-10 DIAGNOSIS — I2510 Atherosclerotic heart disease of native coronary artery without angina pectoris: Secondary | ICD-10-CM | POA: Diagnosis not present

## 2020-02-15 DIAGNOSIS — E1122 Type 2 diabetes mellitus with diabetic chronic kidney disease: Secondary | ICD-10-CM | POA: Diagnosis not present

## 2020-02-15 DIAGNOSIS — I5030 Unspecified diastolic (congestive) heart failure: Secondary | ICD-10-CM | POA: Diagnosis not present

## 2020-02-15 DIAGNOSIS — E1165 Type 2 diabetes mellitus with hyperglycemia: Secondary | ICD-10-CM | POA: Diagnosis not present

## 2020-02-15 DIAGNOSIS — E785 Hyperlipidemia, unspecified: Secondary | ICD-10-CM | POA: Diagnosis not present

## 2020-02-15 DIAGNOSIS — E1129 Type 2 diabetes mellitus with other diabetic kidney complication: Secondary | ICD-10-CM | POA: Diagnosis not present

## 2020-02-15 DIAGNOSIS — D649 Anemia, unspecified: Secondary | ICD-10-CM | POA: Diagnosis not present

## 2020-02-15 DIAGNOSIS — I110 Hypertensive heart disease with heart failure: Secondary | ICD-10-CM | POA: Diagnosis not present

## 2020-02-15 DIAGNOSIS — I2510 Atherosclerotic heart disease of native coronary artery without angina pectoris: Secondary | ICD-10-CM | POA: Diagnosis not present

## 2020-02-15 DIAGNOSIS — Z8546 Personal history of malignant neoplasm of prostate: Secondary | ICD-10-CM | POA: Diagnosis not present

## 2020-02-22 DIAGNOSIS — H25042 Posterior subcapsular polar age-related cataract, left eye: Secondary | ICD-10-CM | POA: Diagnosis not present

## 2020-02-22 DIAGNOSIS — H401121 Primary open-angle glaucoma, left eye, mild stage: Secondary | ICD-10-CM | POA: Diagnosis not present

## 2020-02-22 DIAGNOSIS — H268 Other specified cataract: Secondary | ICD-10-CM | POA: Diagnosis not present

## 2020-02-27 HISTORY — PX: CATARACT EXTRACTION: SUR2

## 2020-03-20 ENCOUNTER — Telehealth: Payer: Self-pay | Admitting: Internal Medicine

## 2020-03-20 NOTE — Telephone Encounter (Signed)
Sure. Thanks 

## 2020-03-20 NOTE — Telephone Encounter (Signed)
Hi Dr. Henrene Pastor,  Patient called and is requesting to transfer care back to you for he feels like he is in better care with you.   Please advise on scheduling.  Thank you

## 2020-03-20 NOTE — Telephone Encounter (Signed)
Called patient to schedule left voicemail. 

## 2020-04-15 ENCOUNTER — Telehealth: Payer: Self-pay | Admitting: Interventional Cardiology

## 2020-04-15 MED ORDER — ISOSORBIDE MONONITRATE ER 30 MG PO TB24: 30.0000 mg | ORAL_TABLET | Freq: Every day | ORAL | 2 refills | Status: DC

## 2020-04-15 MED ORDER — METOPROLOL SUCCINATE ER 50 MG PO TB24: 75.0000 mg | ORAL_TABLET | Freq: Every day | ORAL | 0 refills | Status: DC

## 2020-04-15 MED ORDER — ISOSORBIDE MONONITRATE ER 30 MG PO TB24: 30.0000 mg | ORAL_TABLET | Freq: Every day | ORAL | 0 refills | Status: DC

## 2020-04-15 MED ORDER — METOPROLOL SUCCINATE ER 50 MG PO TB24: 75.0000 mg | ORAL_TABLET | Freq: Every day | ORAL | 2 refills | Status: DC

## 2020-04-15 NOTE — Telephone Encounter (Signed)
Pt's medication was sent to pt's pharmacy as requested. Confirmation received.  °

## 2020-04-15 NOTE — Telephone Encounter (Signed)
New Message    *STAT* If patient is at the pharmacy, call can be transferred to refill team.   1. Which medications need to be refilled? (please list name of each medication and dose if known) metoprolol succinate (TOPROL-XL) 50 MG 24 hr tablet    2. Which pharmacy/location (including street and city if local pharmacy) is medication to be sent to? [place], [address]  3. Do they need a 30 day or 90 day supply? 30 (need immediately filled locally patient is completely out      *STAT* If patient is at the pharmacy, call can be transferred to refill team.   1. Which medications need to be refilled? (please list name of each medication and dose if known) isosorbide mononitrate (IMDUR) 30 MG 24 hr tablet And metoprolol succinate (TOPROL-XL) 50 MG 24 hr tablet   2. Which pharmacy/location (including street and city if local pharmacy) is medication to be sent to? [facility]  3. Do they need a 30 day or 90 day supply? [place]

## 2020-04-16 DIAGNOSIS — Z8551 Personal history of malignant neoplasm of bladder: Secondary | ICD-10-CM | POA: Diagnosis not present

## 2020-04-16 DIAGNOSIS — N1832 Chronic kidney disease, stage 3b: Secondary | ICD-10-CM | POA: Diagnosis not present

## 2020-04-16 DIAGNOSIS — D649 Anemia, unspecified: Secondary | ICD-10-CM | POA: Diagnosis not present

## 2020-04-16 DIAGNOSIS — Z8546 Personal history of malignant neoplasm of prostate: Secondary | ICD-10-CM | POA: Diagnosis not present

## 2020-04-16 DIAGNOSIS — E1129 Type 2 diabetes mellitus with other diabetic kidney complication: Secondary | ICD-10-CM | POA: Diagnosis not present

## 2020-04-16 DIAGNOSIS — E7800 Pure hypercholesterolemia, unspecified: Secondary | ICD-10-CM | POA: Diagnosis not present

## 2020-04-16 DIAGNOSIS — I5030 Unspecified diastolic (congestive) heart failure: Secondary | ICD-10-CM | POA: Diagnosis not present

## 2020-04-16 DIAGNOSIS — I2510 Atherosclerotic heart disease of native coronary artery without angina pectoris: Secondary | ICD-10-CM | POA: Diagnosis not present

## 2020-04-16 DIAGNOSIS — I130 Hypertensive heart and chronic kidney disease with heart failure and stage 1 through stage 4 chronic kidney disease, or unspecified chronic kidney disease: Secondary | ICD-10-CM | POA: Diagnosis not present

## 2020-05-07 ENCOUNTER — Encounter: Payer: Self-pay | Admitting: Internal Medicine

## 2020-05-07 ENCOUNTER — Ambulatory Visit: Payer: Medicare HMO | Admitting: Internal Medicine

## 2020-05-07 VITALS — BP 118/58 | HR 78 | Ht 68.0 in | Wt 221.0 lb

## 2020-05-07 DIAGNOSIS — K591 Functional diarrhea: Secondary | ICD-10-CM

## 2020-05-07 DIAGNOSIS — K219 Gastro-esophageal reflux disease without esophagitis: Secondary | ICD-10-CM | POA: Diagnosis not present

## 2020-05-07 DIAGNOSIS — R142 Eructation: Secondary | ICD-10-CM

## 2020-05-07 NOTE — Patient Instructions (Signed)
Please follow up as needed 

## 2020-05-07 NOTE — Progress Notes (Signed)
HISTORY OF PRESENT ILLNESS:  Aaron Carlson is a 86 y.o. 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  He was last evaluated October 2019 regarding GERD with recurrent symptoms off PPI, chronic dyspepsia, and bloating/belching.  See that dictation.  He has seen multiple gastroenterologist from multiple practices in various locations regarding the same complaints.  Most recently, he was undergoing evaluations with Dr. Jerene Carlson (full Professor of medicine, division of gastroenterology at [facility]).  Last interaction via telehealth medicine April 24, 2019.  According to the doctor the patient was doing better that time.  However he scheduled this appointment for me today.  Does report some reflux symptoms.  He continues to take PPI in a sporadic manner despite being advised otherwise.  He reports to me postprandial burping.  This makes him feel better.  This relieves bloating.  Also about 2 times per month he will have loose stools.  He occasionally takes Imodium.  Problem last less than 1/2 days.  Blood work from October 2020 shows hemoglobin of 11.6.  He has completed colonoscopy surveillance program.  He denies unexplained weight loss, GI bleeding, or abdominal pain  REVIEW OF SYSTEMS:  All non-GI ROS negative unless otherwise stated in the HPI except for visual changes  Past Medical History:  Diagnosis Date  . CAD in native artery 08/28/2013   Heaviness in chest with exertion. Coronary angiography 2005 demonstrated severe distal LAD and diagonal disease, severe distal RCA and PL OM disease, and severe first obtuse marginal disease. No high-grade proximal coronary disease was present at the time.  The most recent myocardial perfusion study in 2014 was nonischemic/low risk    . Chronic renal insufficiency   . Colon polyps    adenomatous  . Diabetes mellitus   . Hyperlipemia    . Hypertension   . Laryngeal papillomatosis   . Prostate cancer ([place])    w/ mets to bladder    Past Surgical History:  Procedure Laterality Date  . BLADDER SURGERY    . CATARACT EXTRACTION Left 02/2020  . CIRCUMCISION  2011  . POLYPECTOMY     vocal cords  . [place]  reports that he has quit smoking. He has never used smokeless tobacco. He reports current alcohol use. He reports that he does not use drugs.  family history includes Breast cancer in his mother; Diabetes in his mother; Heart disease in his father; Heart failure in his father; Pancreatic cancer in his brother.  Allergies  Allergen Reactions  . Penicillins Rash       PHYSICAL EXAMINATION: Vital signs: BP (!) 118/58   Pulse 78   Ht 5\' 8"  (1.727 m)   Wt 221 lb (100.2 kg)   SpO2 99%   BMI 33.60 kg/m   Constitutional: generally well-appearing, no acute distress Psychiatric: alert and oriented x3, cooperative Eyes: extraocular movements intact, anicteric, conjunctiva pink Mouth: oral pharynx moist, no lesions Neck: supple no lymphadenopathy Cardiovascular: heart regular rate and rhythm, no murmur Lungs: clear to auscultation bilaterally Abdomen: soft, nontender, nondistended, no obvious ascites, no peritoneal signs, normal bowel sounds, no organomegaly Rectal: Omitted Extremities: no clubbing, cyanosis, or lower extremity edema bilaterally Skin: no lesions on visible extremities Neuro: No focal deficits.  Cranial nerves intact  ASSESSMENT:  1.  Functional dyspepsia with bloating and belching. 2.  GERD.  Recurrent  symptoms one his PPI compliance is suboptimal 3.  Occasional diarrhea   PLAN:  1.  Reassurance 2.  Reflux precautions 3.  Take PPI daily 4.  Imodium as needed 5.  GI follow-up as needed A total time of 30 minutes was spent preparing to see the patient, reviewing outside evaluations and test, obtaining comprehensive history, performing  comprehensive physical exam, significant time was spent counseling and educating the patient regarding his above listed chronic issues.  Next, reviewing medications and medical therapy recommendations.  Finally, documenting clinical information in his health record

## 2020-05-16 ENCOUNTER — Ambulatory Visit: Payer: Medicare Other | Admitting: Internal Medicine

## 2020-05-17 DIAGNOSIS — D472 Monoclonal gammopathy: Secondary | ICD-10-CM | POA: Diagnosis not present

## 2020-05-17 DIAGNOSIS — I129 Hypertensive chronic kidney disease with stage 1 through stage 4 chronic kidney disease, or unspecified chronic kidney disease: Secondary | ICD-10-CM | POA: Diagnosis not present

## 2020-05-17 DIAGNOSIS — N1832 Chronic kidney disease, stage 3b: Secondary | ICD-10-CM | POA: Diagnosis not present

## 2020-05-17 DIAGNOSIS — N2581 Secondary hyperparathyroidism of renal origin: Secondary | ICD-10-CM | POA: Diagnosis not present

## 2020-05-17 DIAGNOSIS — N189 Chronic kidney disease, unspecified: Secondary | ICD-10-CM | POA: Diagnosis not present

## 2020-05-17 DIAGNOSIS — D631 Anemia in chronic kidney disease: Secondary | ICD-10-CM | POA: Diagnosis not present

## 2020-05-17 DIAGNOSIS — E1122 Type 2 diabetes mellitus with diabetic chronic kidney disease: Secondary | ICD-10-CM | POA: Diagnosis not present

## 2020-05-20 DIAGNOSIS — E1122 Type 2 diabetes mellitus with diabetic chronic kidney disease: Secondary | ICD-10-CM | POA: Diagnosis not present

## 2020-05-20 DIAGNOSIS — E669 Obesity, unspecified: Secondary | ICD-10-CM | POA: Diagnosis not present

## 2020-05-20 DIAGNOSIS — Z794 Long term (current) use of insulin: Secondary | ICD-10-CM | POA: Diagnosis not present

## 2020-05-20 DIAGNOSIS — E1165 Type 2 diabetes mellitus with hyperglycemia: Secondary | ICD-10-CM | POA: Diagnosis not present

## 2020-05-20 DIAGNOSIS — I2510 Atherosclerotic heart disease of native coronary artery without angina pectoris: Secondary | ICD-10-CM | POA: Diagnosis not present

## 2020-05-22 ENCOUNTER — Other Ambulatory Visit: Payer: Self-pay

## 2020-05-22 MED ORDER — BENAZEPRIL HCL 20 MG PO TABS: 20.0000 mg | ORAL_TABLET | Freq: Every day | ORAL | 1 refills | Status: DC

## 2020-06-06 DIAGNOSIS — H524 Presbyopia: Secondary | ICD-10-CM | POA: Diagnosis not present

## 2020-06-06 DIAGNOSIS — H5203 Hypermetropia, bilateral: Secondary | ICD-10-CM | POA: Diagnosis not present

## 2020-06-06 DIAGNOSIS — H52209 Unspecified astigmatism, unspecified eye: Secondary | ICD-10-CM | POA: Diagnosis not present

## 2020-06-19 NOTE — Progress Notes (Signed)
Cardiology Office Note:    Date:  06/20/2020   ID:  Aaron Carlson, DOB [birth year], MRN [id number]  PCP:  Antony Contras, MD  Cardiologist:  Sinclair Grooms, MD   Referring MD: Antony Contras, MD   Chief Complaint  Patient presents with  . Coronary Artery Disease    Angina    History of Present Illness:    Aaron Carlson is a 86 y.o. male with a hx of CAD, documented diffuse vessel disease of diabetic variety by angiography,chronic combined systolic and diastolic heart failure,essential hypertension, chronickidney disease stage 3-4, and elderly and frail.ER visit 06/2019 for RLQ pain, left before being seen.  He consistently gets tightness and dyspnea with moderate physical activity such as dragging his trash cans to the curb.  If he walks a half block at a moderate pace, he will get chest tightness.  He does not get discomfort at rest.  He has never needed to use nitroglycerin.  The discomfort is relieved with rest.  He denies orthopnea, PND, swelling, palpitations, and syncope.  Past Medical History:  Diagnosis Date  . CAD in native artery 08/28/2013   Heaviness in chest with exertion. Coronary angiography 2005 demonstrated severe distal LAD and diagonal disease, severe distal RCA and PL OM disease, and severe first obtuse marginal disease. No high-grade proximal coronary disease was present at the time.  The most recent myocardial perfusion study in 2014 was nonischemic/low risk    . Chronic renal insufficiency   . Colon polyps    adenomatous  . Diabetes mellitus   . Hyperlipemia   . Hypertension   . Laryngeal papillomatosis   . Prostate cancer ([place])    w/ mets to bladder    Past Surgical History:  Procedure Laterality Date  . BLADDER SURGERY    . CATARACT EXTRACTION Left 02/2020  . CIRCUMCISION  2011  . POLYPECTOMY     vocal cords  . PROSTATE SURGERY      Current Medications: Current Meds  Medication Sig  . atorvastatin (LIPITOR) 20 MG tablet Take 20 mg by  mouth daily.  . BD INSULIN SYRINGE ULTRAFINE 31G X 5/16" 0.5 ML MISC Use as directed  . calcium carbonate (TUMS - DOSED IN MG ELEMENTAL CALCIUM) 500 MG chewable tablet Chew 1 tablet by mouth daily as needed for indigestion or heartburn.  . fluticasone (FLONASE) 50 MCG/ACT nasal spray Place into both nostrils daily as needed for allergies or rhinitis.  . furosemide (LASIX) 80 MG tablet Take 1 tablet by mouth daily.  . Garlic 469 MG TABS Take 1 tablet by mouth daily.   . insulin lispro (HUMALOG) 100 UNIT/ML injection Use as directed daily at bedtime as directed per sliding scale.  . insulin NPH Human (HUMULIN N,NOVOLIN N) 100 UNIT/ML injection Inject 38 Units into the skin at bedtime.   . Iron Combinations (CHROMAGEN) capsule Take 1 capsule by mouth daily.  Marland Kitchen latanoprost (XALATAN) 0.005 % ophthalmic solution Place 1 drop into both eyes at bedtime.  Marland Kitchen MAGNESIUM PO Take 1 tablet by mouth daily.  . metoprolol succinate (TOPROL-XL) 50 MG 24 hr tablet Take 1.5 tablets (75 mg total) by mouth daily. Take with or immediately following a meal.  . Multiple Vitamin (MULTIVITAMIN) tablet Take 1 tablet by mouth daily.    . niacin (NIASPAN) 500 MG CR tablet Take 500 mg by mouth at bedtime.  Marland Kitchen omeprazole (PRILOSEC) 20 MG capsule Take 20 mg by mouth daily.  . ONE TOUCH ULTRA TEST test strip  Use as directed  . potassium chloride (KLOR-CON) 20 MEQ packet Take 10 mEq by mouth daily.   . prednisoLONE acetate (PRED FORTE) 1 % ophthalmic suspension 1 drop in the morning, at noon, and at bedtime.  . simethicone (MYLICON) 157 MG chewable tablet Chew 125 mg by mouth every 6 (six) hours as needed for flatulence.  Dema Severin Petrolatum-Mineral Oil (ARTIFICIAL TEARS) ointment Place into the left eye in the morning, at noon, and at bedtime.  . [DISCONTINUED] isosorbide mononitrate (IMDUR) 30 MG 24 hr tablet Take 1 tablet (30 mg total) by mouth daily.     Allergies:   Penicillins   Social History   Socioeconomic History  .  Marital status: Married    Spouse name: Not on file  . Number of children: 4  . Years of education: Not on file  . Highest education level: Not on file  Occupational History  . Occupation: Retired  Tobacco Use  . Smoking status: Former Research scientist (life sciences)  . Smokeless tobacco: Never Used  Vaping Use  . Vaping Use: Never used  Substance and Sexual Activity  . Alcohol use: Yes    Comment: social  . Drug use: No  . Sexual activity: Not on file  Other Topics Concern  . Not on file  Social History Narrative  . Not on file   Social Determinants of Health   Financial Resource Strain:   . Difficulty of Paying Living Expenses: Not on file  Food Insecurity:   . Worried About Charity fundraiser in the Last Year: Not on file  . Ran Out of Food in the Last Year: Not on file  Transportation Needs:   . Lack of Transportation (Medical): Not on file  . Lack of Transportation (Non-Medical): Not on file  Physical Activity:   . Days of Exercise per Week: Not on file  . Minutes of Exercise per Session: Not on file  Stress:   . Feeling of Stress : Not on file  Social Connections:   . Frequency of Communication with Friends and Family: Not on file  . Frequency of Social Gatherings with Friends and Family: Not on file  . Attends Religious Services: Not on file  . Active Member of Clubs or Organizations: Not on file  . Attends Archivist Meetings: Not on file  . Marital Status: Not on file     Family History: The patient's family history includes Breast cancer in his mother; Diabetes in his mother; Heart disease in his father; Heart failure in his father; Pancreatic cancer in his brother.  ROS:   Please see the history of present illness.    CKD is being followed by Dr. Hollie Salk of [facility].  His ultrasound balloon catheter close to the site of the lithotripsy kidneys so now we have just advised that O within the balloon he has had shockwave into the wall of the artery to  help the break of the calcified artery on 9 all other systems reviewed and are negative.  EKGs/Labs/Other Studies Reviewed:    The following studies were reviewed today: No new imaging. No new or recent functional testing.  EKG:  EKG a new tracing is not performed.  The prior tracing done 12/04/2019 demonstrates right bundle, left anterior hemiblock, and first-degree AV block.  Recent Labs: 07/14/2019: ALT 25; BUN 45; Creatinine, Ser 2.25; Hemoglobin 11.6; Platelets 170; Potassium 3.6; Sodium 137  Recent Lipid Panel    Component Value Date/Time   CHOL 119 08/16/2006 0816  TRIG 101 08/16/2006 0816   HDL 42.4 08/16/2006 0816   CHOLHDL 2.8 CALC 08/16/2006 0816   VLDL 20 08/16/2006 0816   LDLCALC 56 08/16/2006 0816    Physical Exam:    VS:  BP 138/76   Pulse 68   Ht 5\' 8"  (1.727 m)   Wt 220 lb 6.4 oz (100 kg)   SpO2 97%   BMI 33.51 kg/m     Wt Readings from Last 3 Encounters:  06/20/20 220 lb 6.4 oz (100 kg)  05/07/20 221 lb (100.2 kg)  12/04/19 233 lb 6.4 oz (105.9 kg)     GEN: Elderly, obese. No acute distress HEENT: Normal NECK: No JVD. LYMPHATICS: No lymphadenopathy CARDIAC:  RRR without murmur, gallop, there is bilateral lower extremity edema. VASCULAR:  Normal Pulses. No bruits. RESPIRATORY:  Clear to auscultation without rales, wheezing or rhonchi  ABDOMEN: Soft, non-tender, non-distended, No pulsatile mass, MUSCULOSKELETAL: No deformity  SKIN: Warm and dry NEUROLOGIC:  Alert and oriented x 3 PSYCHIATRIC:  Normal affect   ASSESSMENT:    1. Coronary artery disease involving native coronary artery of native heart with angina pectoris ([place])   2. Essential hypertension   3. Chronic diastolic heart failure ([place])   4. Mixed hyperlipidemia   5. Stage 3b chronic kidney disease   6. RBBB (right bundle branch block with left anterior fascicular block)   7. Educated about COVID-19 virus infection    PLAN:    In order of problems listed above:  1. Symptomatic  angina, assumed to be related to diffuse coronary disease.  Increase isosorbide to 60 mg/day.  We need to exclude developing systolic heart failure given peripheral edema which could be multifactorial. 2. Blood pressure control for age is adequate.  Continue furosemide 80 mg daily Imdur 60 mg/day, Toprol-XL 75 mg/day, and potassium supplementation 3. 2D Doppler echocardiogram to exclude systolic dysfunction given appearance of last EKG. 4. Continue Lipitor 20 mg/day.  LDL is 64 in July. 5. Perhaps he should be considered for SGLT2 therapy this will provide both cardiac and kidney protection.  I will send a stronger message of systolic function is decreasing. 6. Present on last EKG and not repeated today. 7. Vaccinated with mRNA.  Practicing mitigation.  Overall education and awareness concerning primary/secondary risk prevention was discussed in detail: LDL less than 70, hemoglobin A1c less than 7, blood pressure target less than 130/80 mmHg, >150 minutes of moderate aerobic activity per week, avoidance of smoking, weight control (via diet and exercise), and continued surveillance/management of/for obstructive sleep apnea.    Medication Adjustments/Labs and Tests Ordered: Current medicines are reviewed at length with the patient today.  Concerns regarding medicines are outlined above.  No orders of the defined types were placed in this encounter.  Meds ordered this encounter  Medications  . isosorbide mononitrate (IMDUR) 60 MG 24 hr tablet    Sig: Take 1 tablet (60 mg total) by mouth daily.    Dispense:  90 tablet    Refill:  3    Dose change    Patient Instructions  Medication Instructions:  1) INCREASE Imdur (Isosorbide) to 60mg  once daily.  Call in 2 months and let us know how you are doing on the increased dose.  *If you need a refill on your cardiac medications before your next appointment, please call your pharmacy*   Lab Work: None If you have labs (blood work) drawn today  and your tests are completely normal, you will receive your results only by: Marland Kitchen  MyChart Message (if you have MyChart) OR . A paper copy in the mail If you have any lab test that is abnormal or we need to change your treatment, we will call you to review the results.   Testing/Procedures: None   Follow-Up: At [facility], you and your health needs are our priority.  As part of our continuing mission to provide you with exceptional heart care, we have created designated Provider Care Teams.  These Care Teams include your primary Cardiologist (physician) and Advanced Practice Providers (APPs -  Physician Assistants and Nurse Practitioners) who all work together to provide you with the care you need, when you need it.  We recommend signing up for the patient portal called "MyChart".  Sign up information is provided on this After Visit Summary.  MyChart is used to connect with patients for Virtual Visits (Telemedicine).  Patients are able to view lab/test results, encounter notes, upcoming appointments, etc.  Non-urgent messages can be sent to your provider as well.   To learn more about what you can do with MyChart, go to [email or web address].    Your next appointment:   6 month(s)  The format for your next appointment:   In Person  Provider:   You may see Sinclair Grooms, MD or one of the following Advanced Practice Providers on your designated Care Team:    Truitt Merle, NP  Cecilie Kicks, NP  Kathyrn Drown, NP    Other Instructions      Signed, Sinclair Grooms, MD  06/20/2020 10:24 AM    [place]

## 2020-06-20 ENCOUNTER — Other Ambulatory Visit: Payer: Self-pay

## 2020-06-20 ENCOUNTER — Ambulatory Visit: Payer: Medicare HMO | Admitting: Interventional Cardiology

## 2020-06-20 ENCOUNTER — Encounter: Payer: Self-pay | Admitting: Interventional Cardiology

## 2020-06-20 VITALS — BP 138/76 | HR 68 | Ht 68.0 in | Wt 220.4 lb

## 2020-06-20 DIAGNOSIS — I10 Essential (primary) hypertension: Secondary | ICD-10-CM

## 2020-06-20 DIAGNOSIS — N1832 Chronic kidney disease, stage 3b: Secondary | ICD-10-CM

## 2020-06-20 DIAGNOSIS — E782 Mixed hyperlipidemia: Secondary | ICD-10-CM | POA: Diagnosis not present

## 2020-06-20 DIAGNOSIS — I25119 Atherosclerotic heart disease of native coronary artery with unspecified angina pectoris: Secondary | ICD-10-CM

## 2020-06-20 DIAGNOSIS — I452 Bifascicular block: Secondary | ICD-10-CM | POA: Diagnosis not present

## 2020-06-20 DIAGNOSIS — Z7189 Other specified counseling: Secondary | ICD-10-CM | POA: Diagnosis not present

## 2020-06-20 DIAGNOSIS — I5032 Chronic diastolic (congestive) heart failure: Secondary | ICD-10-CM | POA: Diagnosis not present

## 2020-06-20 MED ORDER — ISOSORBIDE MONONITRATE ER 60 MG PO TB24: 60.0000 mg | ORAL_TABLET | Freq: Every day | ORAL | 3 refills | Status: DC

## 2020-06-20 NOTE — Patient Instructions (Signed)
Medication Instructions:  1) INCREASE Imdur (Isosorbide) to 60mg  once daily.  Call in 2 months and let us know how you are doing on the increased dose.  *If you need a refill on your cardiac medications before your next appointment, please call your pharmacy*   Lab Work: None If you have labs (blood work) drawn today and your tests are completely normal, you will receive your results only by: Marland Kitchen MyChart Message (if you have MyChart) OR . A paper copy in the mail If you have any lab test that is abnormal or we need to change your treatment, we will call you to review the results.   Testing/Procedures: None   Follow-Up: At [facility], you and your health needs are our priority.  As part of our continuing mission to provide you with exceptional heart care, we have created designated Provider Care Teams.  These Care Teams include your primary Cardiologist (physician) and Advanced Practice Providers (APPs -  Physician Assistants and Nurse Practitioners) who all work together to provide you with the care you need, when you need it.  We recommend signing up for the patient portal called "MyChart".  Sign up information is provided on this After Visit Summary.  MyChart is used to connect with patients for Virtual Visits (Telemedicine).  Patients are able to view lab/test results, encounter notes, upcoming appointments, etc.  Non-urgent messages can be sent to your provider as well.   To learn more about what you can do with MyChart, go to [email or web address].    Your next appointment:   6 month(s)  The format for your next appointment:   In Person  Provider:   You may see Sinclair Grooms, MD or one of the following Advanced Practice Providers on your designated Care Team:    Truitt Merle, NP  Cecilie Kicks, NP  Kathyrn Drown, NP    Other Instructions

## 2020-06-27 DIAGNOSIS — E1129 Type 2 diabetes mellitus with other diabetic kidney complication: Secondary | ICD-10-CM | POA: Diagnosis not present

## 2020-06-27 DIAGNOSIS — I110 Hypertensive heart disease with heart failure: Secondary | ICD-10-CM | POA: Diagnosis not present

## 2020-06-27 DIAGNOSIS — E1165 Type 2 diabetes mellitus with hyperglycemia: Secondary | ICD-10-CM | POA: Diagnosis not present

## 2020-06-27 DIAGNOSIS — I130 Hypertensive heart and chronic kidney disease with heart failure and stage 1 through stage 4 chronic kidney disease, or unspecified chronic kidney disease: Secondary | ICD-10-CM | POA: Diagnosis not present

## 2020-06-27 DIAGNOSIS — E785 Hyperlipidemia, unspecified: Secondary | ICD-10-CM | POA: Diagnosis not present

## 2020-06-27 DIAGNOSIS — H409 Unspecified glaucoma: Secondary | ICD-10-CM | POA: Diagnosis not present

## 2020-06-27 DIAGNOSIS — E1122 Type 2 diabetes mellitus with diabetic chronic kidney disease: Secondary | ICD-10-CM | POA: Diagnosis not present

## 2020-06-27 DIAGNOSIS — E7800 Pure hypercholesterolemia, unspecified: Secondary | ICD-10-CM | POA: Diagnosis not present

## 2020-06-27 DIAGNOSIS — D649 Anemia, unspecified: Secondary | ICD-10-CM | POA: Diagnosis not present

## 2020-06-28 ENCOUNTER — Telehealth: Payer: Self-pay | Admitting: Interventional Cardiology

## 2020-06-28 MED ORDER — ISOSORBIDE MONONITRATE ER 60 MG PO TB24: 60.0000 mg | ORAL_TABLET | Freq: Every day | ORAL | 3 refills | Status: DC

## 2020-06-28 NOTE — Telephone Encounter (Signed)
Pts RX for Imdur sent to [facility] per his request.

## 2020-06-28 NOTE — Telephone Encounter (Signed)
New message:     Patient calling stating that his medication was sent to [facility] on [facility] and would like it to be sent to [facility].

## 2020-07-02 DIAGNOSIS — H903 Sensorineural hearing loss, bilateral: Secondary | ICD-10-CM | POA: Diagnosis not present

## 2020-08-09 DIAGNOSIS — E1122 Type 2 diabetes mellitus with diabetic chronic kidney disease: Secondary | ICD-10-CM | POA: Diagnosis not present

## 2020-08-09 DIAGNOSIS — E669 Obesity, unspecified: Secondary | ICD-10-CM | POA: Diagnosis not present

## 2020-08-09 DIAGNOSIS — I2510 Atherosclerotic heart disease of native coronary artery without angina pectoris: Secondary | ICD-10-CM | POA: Diagnosis not present

## 2020-08-09 DIAGNOSIS — Z794 Long term (current) use of insulin: Secondary | ICD-10-CM | POA: Diagnosis not present

## 2020-08-27 DIAGNOSIS — E785 Hyperlipidemia, unspecified: Secondary | ICD-10-CM | POA: Diagnosis not present

## 2020-08-27 DIAGNOSIS — I5030 Unspecified diastolic (congestive) heart failure: Secondary | ICD-10-CM | POA: Diagnosis not present

## 2020-08-27 DIAGNOSIS — I2510 Atherosclerotic heart disease of native coronary artery without angina pectoris: Secondary | ICD-10-CM | POA: Diagnosis not present

## 2020-08-27 DIAGNOSIS — E1122 Type 2 diabetes mellitus with diabetic chronic kidney disease: Secondary | ICD-10-CM | POA: Diagnosis not present

## 2020-08-27 DIAGNOSIS — E1129 Type 2 diabetes mellitus with other diabetic kidney complication: Secondary | ICD-10-CM | POA: Diagnosis not present

## 2020-08-27 DIAGNOSIS — D649 Anemia, unspecified: Secondary | ICD-10-CM | POA: Diagnosis not present

## 2020-08-27 DIAGNOSIS — I110 Hypertensive heart disease with heart failure: Secondary | ICD-10-CM | POA: Diagnosis not present

## 2020-08-27 DIAGNOSIS — H409 Unspecified glaucoma: Secondary | ICD-10-CM | POA: Diagnosis not present

## 2020-08-27 DIAGNOSIS — E7800 Pure hypercholesterolemia, unspecified: Secondary | ICD-10-CM | POA: Diagnosis not present

## 2020-08-30 ENCOUNTER — Ambulatory Visit: Payer: Medicare HMO | Admitting: Podiatry

## 2020-08-30 ENCOUNTER — Other Ambulatory Visit: Payer: Self-pay

## 2020-08-30 DIAGNOSIS — B351 Tinea unguium: Secondary | ICD-10-CM | POA: Diagnosis not present

## 2020-08-30 DIAGNOSIS — E1169 Type 2 diabetes mellitus with other specified complication: Secondary | ICD-10-CM

## 2020-08-30 DIAGNOSIS — E1151 Type 2 diabetes mellitus with diabetic peripheral angiopathy without gangrene: Secondary | ICD-10-CM

## 2020-08-30 NOTE — Progress Notes (Signed)
  Subjective:  Patient ID: Aaron Carlson, male    DOB: 03/26/1933,  MRN: [id number]  Chief Complaint  Patient presents with  . routine foot care    Pt stated that he is doing well he would like his nails trimmed but the right and left hallux nail is sore   86 y.o. male presents with the above complaint. History confirmed with patient.   Aaron Contras, MD last seen 4 months ago.  Objective:  Physical Exam: warm, good capillary refill, nail exam onychomycosis of the toenails, no trophic changes or ulcerative lesions. protective sensation intact , faint DP non-palp PT pulses Left Foot: normal exam, no swelling, tenderness, instability; ligaments intact, full range of motion of all ankle/foot joints  Right Foot: normal exam, no swelling, tenderness, instability; ligaments intact, full range of motion of all ankle/foot joints   No images are attached to the encounter.  Assessment:   1. Onychomycosis of multiple toenails with type 2 diabetes mellitus and peripheral angiopathy ([place])    Plan:  Patient was evaluated and treated and all questions answered.  Onychomycosis, Diabetes and PAD -Patient is diabetic with a qualifying condition for at risk foot care.  Procedure: Nail Debridement Type of Debridement: manual, sharp debridement. Instrumentation: Nail nipper, rotary burr. Number of Nails: 10  Return in about 3 months (around 11/28/2020) for Diabetic Foot Care.

## 2020-10-02 ENCOUNTER — Encounter: Payer: Self-pay | Admitting: Internal Medicine

## 2020-10-02 ENCOUNTER — Ambulatory Visit: Payer: Medicare HMO | Admitting: Internal Medicine

## 2020-10-02 VITALS — BP 140/80 | HR 87 | Ht 68.0 in | Wt 223.4 lb

## 2020-10-02 DIAGNOSIS — K219 Gastro-esophageal reflux disease without esophagitis: Secondary | ICD-10-CM | POA: Diagnosis not present

## 2020-10-02 DIAGNOSIS — K591 Functional diarrhea: Secondary | ICD-10-CM | POA: Diagnosis not present

## 2020-10-02 DIAGNOSIS — R140 Abdominal distension (gaseous): Secondary | ICD-10-CM

## 2020-10-02 DIAGNOSIS — R1013 Epigastric pain: Secondary | ICD-10-CM

## 2020-10-02 MED ORDER — PANTOPRAZOLE SODIUM 40 MG PO TBEC: 40.0000 mg | DELAYED_RELEASE_TABLET | Freq: Every day | ORAL | 3 refills | Status: DC

## 2020-10-02 NOTE — Patient Instructions (Signed)
If you are age 85 or older, your body mass index should be between 23-30. Your Body mass index is 33.97 kg/m. If this is out of the aforementioned range listed, please consider follow up with your Primary Care Provider.  If you are age 64 or younger, your body mass index should be between 19-25. Your Body mass index is 33.97 kg/m. If this is out of the aformentioned range listed, please consider follow up with your Primary Care Provider.     We have sent the following medications to your pharmacy for you to pick up at your convenience:  Pantoprazole  You have been scheduled for an endoscopy. Please follow written instructions given to you at your visit today. If you use inhalers (even only as needed), please bring them with you on the day of your procedure.

## 2020-10-02 NOTE — Progress Notes (Signed)
HISTORY OF PRESENT ILLNESS:  Aaron Carlson is a 87 y.o. male, retired Company secretary, who has been followed in this office for functional dyspepsia with chronic bloating and belching, GERD, and occasional diarrhea.  He was last evaluated May 07, 2020.  See that dictation for details.  He tells me today that he has been having breakthrough GERD symptoms as manifested by regurgitation with pyrosis, particularly at night.  He does tell me that he is now compliant with omeprazole 20 mg daily.  He continues with chronic bloating and belching.  He describes this as abdominal discomfort.  He belches throughout today's interview.  He denies dysphagia.  No abdominal pain.  He continues with occasional loose stools approximately 3 times per week.  Rare episodes of incontinence.  This is no worse.  No bleeding.  No weight loss.  He has completed his COVID vaccination series  REVIEW OF SYSTEMS:  All non-GI ROS negative unless otherwise stated in the HPI.  Past Medical History:  Diagnosis Date  . CAD in native artery 08/28/2013   Heaviness in chest with exertion. Coronary angiography 2005 demonstrated severe distal LAD and diagonal disease, severe distal RCA and PL OM disease, and severe first obtuse marginal disease. No high-grade proximal coronary disease was present at the time.  The most recent myocardial perfusion study in 2014 was nonischemic/low risk    . Chronic renal insufficiency   . Colon polyps    adenomatous  . Diabetes mellitus   . Hyperlipemia   . Hypertension   . Laryngeal papillomatosis   . Prostate cancer ([place])    w/ mets to bladder    Past Surgical History:  Procedure Laterality Date  . BLADDER SURGERY    . CATARACT EXTRACTION Left 02/2020  . CIRCUMCISION  2011  . POLYPECTOMY     vocal cords  . [place]  reports that he has quit smoking. He has never used smokeless tobacco. He reports current alcohol use. He reports that he does not  use drugs.  family history includes Breast cancer in his mother; Diabetes in his mother; Heart disease in his father; Heart failure in his father; Pancreatic cancer in his brother.  Allergies  Allergen Reactions  . Penicillins Rash       PHYSICAL EXAMINATION: Vital signs: BP 140/80   Pulse 87   Ht 5\' 8"  (1.727 m)   Wt 223 lb 6.4 oz (101.3 kg)   SpO2 99%   BMI 33.97 kg/m   Constitutional: generally well-appearing, no acute distress Psychiatric: alert and oriented x3, cooperative Eyes: extraocular movements intact, anicteric, conjunctiva pink Mouth: oral pharynx moist, no lesions Neck: supple no lymphadenopathy Cardiovascular: heart regular rate and rhythm, no murmur Lungs: clear to auscultation bilaterally Abdomen: soft, obese, nontender, nondistended, no obvious ascites, no peritoneal signs, normal bowel sounds, no organomegaly Rectal: Omitted Extremities: no clubbing, cyanosis, or lower extremity edema bilaterally Skin: no lesions on visible extremities Neuro: No focal deficits.  Cranial nerves intact  ASSESSMENT:  1.  GERD.  Appears to be having breakthrough symptoms, particularly at night. 2.  Functional dyspepsia with bloating and belching.  Ongoing 3.  Occasional loose stools.  He is diabetic.  Does take magnesium   PLAN:  1.  Reflux precautions 2.  Prescribed pantoprazole 40 mg daily.  Discussed medication effects and side effects. 3.  Discontinue omeprazole 4.  Recommended daily probiotic for 1 month 5.  Schedule upper endoscopy to evaluate worsening GERD  despite PPI and dyspeptic symptoms with abdominal discomfort.The nature of the procedure, as well as the risks, benefits, and alternatives were carefully and thoroughly reviewed with the patient. Ample time for discussion and questions allowed. The patient understood, was satisfied, and agreed to proceed. 6.  Antidiarrheals as needed

## 2020-10-09 DIAGNOSIS — I2510 Atherosclerotic heart disease of native coronary artery without angina pectoris: Secondary | ICD-10-CM | POA: Diagnosis not present

## 2020-10-09 DIAGNOSIS — I130 Hypertensive heart and chronic kidney disease with heart failure and stage 1 through stage 4 chronic kidney disease, or unspecified chronic kidney disease: Secondary | ICD-10-CM | POA: Diagnosis not present

## 2020-10-09 DIAGNOSIS — H409 Unspecified glaucoma: Secondary | ICD-10-CM | POA: Diagnosis not present

## 2020-10-09 DIAGNOSIS — D649 Anemia, unspecified: Secondary | ICD-10-CM | POA: Diagnosis not present

## 2020-10-09 DIAGNOSIS — E1165 Type 2 diabetes mellitus with hyperglycemia: Secondary | ICD-10-CM | POA: Diagnosis not present

## 2020-10-09 DIAGNOSIS — E785 Hyperlipidemia, unspecified: Secondary | ICD-10-CM | POA: Diagnosis not present

## 2020-10-09 DIAGNOSIS — I5030 Unspecified diastolic (congestive) heart failure: Secondary | ICD-10-CM | POA: Diagnosis not present

## 2020-10-09 DIAGNOSIS — E1129 Type 2 diabetes mellitus with other diabetic kidney complication: Secondary | ICD-10-CM | POA: Diagnosis not present

## 2020-10-09 DIAGNOSIS — E1122 Type 2 diabetes mellitus with diabetic chronic kidney disease: Secondary | ICD-10-CM | POA: Diagnosis not present

## 2020-10-22 DIAGNOSIS — N1832 Chronic kidney disease, stage 3b: Secondary | ICD-10-CM | POA: Diagnosis not present

## 2020-10-22 DIAGNOSIS — E7800 Pure hypercholesterolemia, unspecified: Secondary | ICD-10-CM | POA: Diagnosis not present

## 2020-10-22 DIAGNOSIS — Z1389 Encounter for screening for other disorder: Secondary | ICD-10-CM | POA: Diagnosis not present

## 2020-10-22 DIAGNOSIS — E1129 Type 2 diabetes mellitus with other diabetic kidney complication: Secondary | ICD-10-CM | POA: Diagnosis not present

## 2020-10-22 DIAGNOSIS — Z0000 Encounter for general adult medical examination without abnormal findings: Secondary | ICD-10-CM | POA: Diagnosis not present

## 2020-10-22 DIAGNOSIS — D649 Anemia, unspecified: Secondary | ICD-10-CM | POA: Diagnosis not present

## 2020-10-22 DIAGNOSIS — K219 Gastro-esophageal reflux disease without esophagitis: Secondary | ICD-10-CM | POA: Diagnosis not present

## 2020-10-22 DIAGNOSIS — I130 Hypertensive heart and chronic kidney disease with heart failure and stage 1 through stage 4 chronic kidney disease, or unspecified chronic kidney disease: Secondary | ICD-10-CM | POA: Diagnosis not present

## 2020-10-22 DIAGNOSIS — Z794 Long term (current) use of insulin: Secondary | ICD-10-CM | POA: Diagnosis not present

## 2020-10-22 DIAGNOSIS — I5030 Unspecified diastolic (congestive) heart failure: Secondary | ICD-10-CM | POA: Diagnosis not present

## 2020-10-22 DIAGNOSIS — I2510 Atherosclerotic heart disease of native coronary artery without angina pectoris: Secondary | ICD-10-CM | POA: Diagnosis not present

## 2020-10-22 DIAGNOSIS — Z8546 Personal history of malignant neoplasm of prostate: Secondary | ICD-10-CM | POA: Diagnosis not present

## 2020-10-29 ENCOUNTER — Telehealth: Payer: Self-pay | Admitting: Internal Medicine

## 2020-10-29 NOTE — Telephone Encounter (Signed)
Take Pepcid 20 mg at bedtime

## 2020-10-29 NOTE — Telephone Encounter (Signed)
Called patient and let him know Dr. Henrene Pastor would like him to try Pepcid-20mg  at bedtime (OTC)

## 2020-10-29 NOTE — Telephone Encounter (Signed)
Pt states he is taking protonix 40mg  in the am and it is doing good during the day. Reports about an hour or 2 after he goes to bed he starts having burping and heart burn that really hurts. He is getting up at night and taking pepto bismol 2-3 times a night. Pt reported his stool was black, explained to him this was caused by the pepto bismol. Pt wants to know what Dr. Henrene Pastor recommends. Please advise.

## 2020-11-05 ENCOUNTER — Other Ambulatory Visit: Payer: Self-pay

## 2020-11-05 ENCOUNTER — Ambulatory Visit (AMBULATORY_SURGERY_CENTER): Payer: Medicare HMO | Admitting: Internal Medicine

## 2020-11-05 ENCOUNTER — Encounter: Payer: Self-pay | Admitting: Internal Medicine

## 2020-11-05 VITALS — BP 139/78 | HR 67 | Temp 97.1°F | Resp 14 | Ht 68.0 in | Wt 223.0 lb

## 2020-11-05 DIAGNOSIS — R1013 Epigastric pain: Secondary | ICD-10-CM

## 2020-11-05 DIAGNOSIS — K2100 Gastro-esophageal reflux disease with esophagitis, without bleeding: Secondary | ICD-10-CM

## 2020-11-05 DIAGNOSIS — K30 Functional dyspepsia: Secondary | ICD-10-CM | POA: Diagnosis not present

## 2020-11-05 DIAGNOSIS — R140 Abdominal distension (gaseous): Secondary | ICD-10-CM

## 2020-11-05 DIAGNOSIS — I129 Hypertensive chronic kidney disease with stage 1 through stage 4 chronic kidney disease, or unspecified chronic kidney disease: Secondary | ICD-10-CM | POA: Diagnosis not present

## 2020-11-05 DIAGNOSIS — N183 Chronic kidney disease, stage 3 unspecified: Secondary | ICD-10-CM | POA: Diagnosis not present

## 2020-11-05 DIAGNOSIS — E119 Type 2 diabetes mellitus without complications: Secondary | ICD-10-CM | POA: Diagnosis not present

## 2020-11-05 DIAGNOSIS — K219 Gastro-esophageal reflux disease without esophagitis: Secondary | ICD-10-CM | POA: Diagnosis not present

## 2020-11-05 MED ORDER — SODIUM CHLORIDE 0.9 % IV SOLN: 500.0000 mL | Freq: Once | INTRAVENOUS | Status: DC

## 2020-11-05 NOTE — Progress Notes (Signed)
Report to PACU, RN, vss, BBS= Clear.  

## 2020-11-05 NOTE — Progress Notes (Signed)
Pt's states no medical or surgical changes since previsit or office visit.  Cw vitals and AG IV.

## 2020-11-05 NOTE — Patient Instructions (Signed)
Resume previous diet Continue current medications  YOU HAD AN ENDOSCOPIC PROCEDURE TODAY AT THE [facility]:   Refer to the procedure report that was given to you for any specific questions about what was found during the examination.  If the procedure report does not answer your questions, please call your gastroenterologist to clarify.  If you requested that your care partner not be given the details of your procedure findings, then the procedure report has been included in a sealed envelope for you to review at your convenience later.  YOU SHOULD EXPECT: Some feelings of bloating in the abdomen. Passage of more gas than usual.  Walking can help get rid of the air that was put into your GI tract during the procedure and reduce the bloating. If you had a lower endoscopy (such as a colonoscopy or flexible sigmoidoscopy) you may notice spotting of blood in your stool or on the toilet paper. If you underwent a bowel prep for your procedure, you may not have a normal bowel movement for a few days.  Please Note:  You might notice some irritation and congestion in your nose or some drainage.  This is from the oxygen used during your procedure.  There is no need for concern and it should clear up in a day or so.  SYMPTOMS TO REPORT IMMEDIATELY:  Following upper endoscopy (EGD)  Vomiting of blood or coffee ground material  New chest pain or pain under the shoulder blades  Painful or persistently difficult swallowing  New shortness of breath  Fever of 100F or higher  Black, tarry-looking stools  For urgent or emergent issues, a gastroenterologist can be reached at any hour by calling [phone]. Do not use MyChart messaging for urgent concerns.   DIET:  We do recommend a small meal at first, but then you may proceed to your regular diet.  Drink plenty of fluids but you should avoid alcoholic beverages for 24 hours.  ACTIVITY:  You should plan to take it easy for the rest of today  and you should NOT DRIVE or use heavy machinery until tomorrow (because of the sedation medicines used during the test).    FOLLOW UP: Our staff will call the number listed on your records 48-72 hours following your procedure to check on you and address any questions or concerns that you may have regarding the information given to you following your procedure. If we do not reach you, we will leave a message.  We will attempt to reach you two times.  During this call, we will ask if you have developed any symptoms of COVID 19. If you develop any symptoms (ie: fever, flu-like symptoms, shortness of breath, cough etc.) before then, please call [phone].  If you test positive for Covid 19 in the 2 weeks post procedure, please call and report this information to us.    If any biopsies were taken you will be contacted by phone or by letter within the next 1-3 weeks.  Please call us at [phone] if you have not heard about the biopsies in 3 weeks.   SIGNATURES/CONFIDENTIALITY: You and/or your care partner have signed paperwork which will be entered into your electronic medical record.  These signatures attest to the fact that that the information above on your After Visit Summary has been reviewed and is understood.  Full responsibility of the confidentiality of this discharge information lies with you and/or your care-partner. 

## 2020-11-05 NOTE — Op Note (Signed)
[place] Patient Name: Aaron Carlson Procedure Date: 11/05/2020 11:05 AM MRN: [id number] Endoscopist: Docia Chuck. Henrene Pastor , MD Age: 87 Referring MD:  Date of Birth: 1933/01/28 Gender: Male Account #: [id number] Procedure:                Upper GI endoscopy Indications:              Dyspepsia, Abdominal bloating Medicines:                Monitored Anesthesia Care Procedure:                Pre-Anesthesia Assessment:                           - Prior to the procedure, a History and Physical                            was performed, and patient medications and                            allergies were reviewed. The patient's tolerance of                            previous anesthesia was also reviewed. The risks                            and benefits of the procedure and the sedation                            options and risks were discussed with the patient.                            All questions were answered, and informed consent                            was obtained. Prior Anticoagulants: The patient has                            taken no previous anticoagulant or antiplatelet                            agents. ASA Grade Assessment: II - A patient with                            mild systemic disease. After reviewing the risks                            and benefits, the patient was deemed in                            satisfactory condition to undergo the procedure.                           After obtaining informed consent, the endoscope was  passed under direct vision. Throughout the                            procedure, the patient's blood pressure, pulse, and                            oxygen saturations were monitored continuously. The                            Endoscope was introduced through the mouth, and                            advanced to the second part of duodenum. The upper                            GI endoscopy was accomplished  without difficulty.                            The patient tolerated the procedure well. Scope In: Scope Out: Findings:                 The esophagus was normal.                           The stomach was normal.                           The examined duodenum was normal.                           The cardia and gastric fundus were normal on                            retroflexion. Complications:            No immediate complications. Estimated Blood Loss:     Estimated blood loss: none. Impression:               - Normal esophagus.                           - Normal stomach.                           - Normal examined duodenum.                           - No specimens collected. Recommendation:           - Patient has a contact number available for                            emergencies. The signs and symptoms of potential                            delayed complications were discussed with the  patient. Return to normal activities tomorrow.                            Written discharge instructions were provided to the                            patient.                           - Resume previous diet.                           - Continue present medications. Docia Chuck. Henrene Pastor, MD 11/05/2020 11:18:25 AM This report has been signed electronically.

## 2020-11-06 DIAGNOSIS — H1045 Other chronic allergic conjunctivitis: Secondary | ICD-10-CM | POA: Diagnosis not present

## 2020-11-06 DIAGNOSIS — H43822 Vitreomacular adhesion, left eye: Secondary | ICD-10-CM | POA: Diagnosis not present

## 2020-11-06 DIAGNOSIS — E119 Type 2 diabetes mellitus without complications: Secondary | ICD-10-CM | POA: Diagnosis not present

## 2020-11-06 DIAGNOSIS — Z961 Presence of intraocular lens: Secondary | ICD-10-CM | POA: Diagnosis not present

## 2020-11-06 DIAGNOSIS — H25811 Combined forms of age-related cataract, right eye: Secondary | ICD-10-CM | POA: Diagnosis not present

## 2020-11-06 DIAGNOSIS — H401131 Primary open-angle glaucoma, bilateral, mild stage: Secondary | ICD-10-CM | POA: Diagnosis not present

## 2020-11-06 DIAGNOSIS — H16142 Punctate keratitis, left eye: Secondary | ICD-10-CM | POA: Diagnosis not present

## 2020-11-07 ENCOUNTER — Telehealth: Payer: Self-pay

## 2020-11-07 NOTE — Telephone Encounter (Signed)
  Follow up Call-  Call back number 11/05/2020  Post procedure Call Back phone  # [phone]  Permission to leave phone message Yes  Some recent data might be hidden     Patient questions:  Do you have a fever, pain , or abdominal swelling? No. Pain Score  0 *  Have you tolerated food without any problems? Yes.    Have you been able to return to your normal activities? Yes.    Do you have any questions about your discharge instructions: Diet   No. Medications  No. Follow up visit  No.  Do you have questions or concerns about your Care? No.  Actions: * If pain score is 4 or above: 1. No action needed, pain <4.Have you developed a fever since your procedure? no  2.   Have you had an respiratory symptoms (SOB or cough) since your procedure? no  3.   Have you tested positive for COVID 19 since your procedure no  4.   Have you had any family members/close contacts diagnosed with the COVID 19 since your procedure?  no   If yes to any of these questions please route to Joylene John, RN and Joella Prince, RN

## 2020-11-11 DIAGNOSIS — D631 Anemia in chronic kidney disease: Secondary | ICD-10-CM | POA: Diagnosis not present

## 2020-11-11 DIAGNOSIS — I129 Hypertensive chronic kidney disease with stage 1 through stage 4 chronic kidney disease, or unspecified chronic kidney disease: Secondary | ICD-10-CM | POA: Diagnosis not present

## 2020-11-11 DIAGNOSIS — K30 Functional dyspepsia: Secondary | ICD-10-CM | POA: Diagnosis not present

## 2020-11-11 DIAGNOSIS — D472 Monoclonal gammopathy: Secondary | ICD-10-CM | POA: Diagnosis not present

## 2020-11-11 DIAGNOSIS — N2581 Secondary hyperparathyroidism of renal origin: Secondary | ICD-10-CM | POA: Diagnosis not present

## 2020-11-11 DIAGNOSIS — N1832 Chronic kidney disease, stage 3b: Secondary | ICD-10-CM | POA: Diagnosis not present

## 2020-11-11 DIAGNOSIS — E1122 Type 2 diabetes mellitus with diabetic chronic kidney disease: Secondary | ICD-10-CM | POA: Diagnosis not present

## 2020-11-11 DIAGNOSIS — N189 Chronic kidney disease, unspecified: Secondary | ICD-10-CM | POA: Diagnosis not present

## 2020-11-14 ENCOUNTER — Other Ambulatory Visit: Payer: Self-pay

## 2020-11-14 ENCOUNTER — Telehealth: Payer: Self-pay | Admitting: Internal Medicine

## 2020-11-14 DIAGNOSIS — G8929 Other chronic pain: Secondary | ICD-10-CM

## 2020-11-14 DIAGNOSIS — R1013 Epigastric pain: Secondary | ICD-10-CM

## 2020-11-14 NOTE — Telephone Encounter (Signed)
Patient called states he is having a lot of gas and sharp pain in his chest seeking advise.

## 2020-11-14 NOTE — Telephone Encounter (Signed)
Spoke with pt and he is aware and knows to expect a call from radiology scheduling to set up the CT scan. Order in epic and scheduling notified.

## 2020-11-14 NOTE — Telephone Encounter (Signed)
1.  He has functional dyspepsia.  Unlikely that any remedy is going to help.  He has had this problem for many years and has seen many gastroenterologists 2.  However, just to make sure all the bases are covered and there is no other problem to be concerned with, schedule contrast-enhanced CT scan of the abdomen "epigastric pain, evaluate"

## 2020-11-14 NOTE — Telephone Encounter (Signed)
Pt reports for the past few days he has been having more gas with lots of belching and epigastric pain. He states he has been up at night with the epigastric pain. He reports he is taking protonix 40mg  and pepcid 20mg  bid. Pt wants to know what Dr. Henrene Pastor recommends, please advise.

## 2020-11-20 ENCOUNTER — Other Ambulatory Visit: Payer: Self-pay | Admitting: Interventional Cardiology

## 2020-11-20 MED ORDER — BENAZEPRIL HCL 20 MG PO TABS: 20.0000 mg | ORAL_TABLET | Freq: Every day | ORAL | 2 refills | Status: DC

## 2020-11-25 ENCOUNTER — Ambulatory Visit (HOSPITAL_COMMUNITY): Admission: RE | Admit: 2020-11-25 | Payer: Medicare HMO | Source: Ambulatory Visit

## 2020-11-26 ENCOUNTER — Encounter (INDEPENDENT_AMBULATORY_CARE_PROVIDER_SITE_OTHER): Payer: Self-pay

## 2020-11-26 DIAGNOSIS — E119 Type 2 diabetes mellitus without complications: Secondary | ICD-10-CM | POA: Diagnosis not present

## 2020-11-26 DIAGNOSIS — Z961 Presence of intraocular lens: Secondary | ICD-10-CM | POA: Diagnosis not present

## 2020-11-26 DIAGNOSIS — H401131 Primary open-angle glaucoma, bilateral, mild stage: Secondary | ICD-10-CM | POA: Diagnosis not present

## 2020-11-26 DIAGNOSIS — H43822 Vitreomacular adhesion, left eye: Secondary | ICD-10-CM | POA: Diagnosis not present

## 2020-11-29 ENCOUNTER — Other Ambulatory Visit: Payer: Self-pay

## 2020-11-29 ENCOUNTER — Encounter: Payer: Self-pay | Admitting: Podiatry

## 2020-11-29 ENCOUNTER — Ambulatory Visit (INDEPENDENT_AMBULATORY_CARE_PROVIDER_SITE_OTHER): Payer: Medicare HMO | Admitting: Podiatry

## 2020-11-29 DIAGNOSIS — Z794 Long term (current) use of insulin: Secondary | ICD-10-CM | POA: Insufficient documentation

## 2020-11-29 DIAGNOSIS — E1169 Type 2 diabetes mellitus with other specified complication: Secondary | ICD-10-CM

## 2020-11-29 DIAGNOSIS — L600 Ingrowing nail: Secondary | ICD-10-CM

## 2020-11-29 DIAGNOSIS — E1121 Type 2 diabetes mellitus with diabetic nephropathy: Secondary | ICD-10-CM | POA: Insufficient documentation

## 2020-11-29 DIAGNOSIS — B351 Tinea unguium: Secondary | ICD-10-CM | POA: Diagnosis not present

## 2020-11-29 DIAGNOSIS — E1151 Type 2 diabetes mellitus with diabetic peripheral angiopathy without gangrene: Secondary | ICD-10-CM

## 2020-11-29 DIAGNOSIS — E1165 Type 2 diabetes mellitus with hyperglycemia: Secondary | ICD-10-CM | POA: Insufficient documentation

## 2020-11-29 DIAGNOSIS — D649 Anemia, unspecified: Secondary | ICD-10-CM | POA: Insufficient documentation

## 2020-11-29 DIAGNOSIS — Q828 Other specified congenital malformations of skin: Secondary | ICD-10-CM | POA: Diagnosis not present

## 2020-11-29 DIAGNOSIS — Z8546 Personal history of malignant neoplasm of prostate: Secondary | ICD-10-CM | POA: Insufficient documentation

## 2020-11-29 DIAGNOSIS — H409 Unspecified glaucoma: Secondary | ICD-10-CM | POA: Insufficient documentation

## 2020-11-29 NOTE — Patient Instructions (Addendum)
EPSOM SALT FOOT SOAK INSTRUCTIONS   Shopping List:  A. Plain epsom salt (not scented) B. Neosporin Cream/Ointment or Bacitracin Cream/Ointment (or prescribed antiobiotic drops/cream/ointment) C. 1-inch fabric band-aids  1.  Place 1/4 cup of epsom salts in 2 quarts of warm tap water. IF YOU ARE DIABETIC, OR HAVE NEUROPATHY, CHECK THE TEMPERATURE OF THE WATER WITH YOUR ELBOW.  2.  Submerge your foot/feet in the solution and soak for 10-15 minutes.      3.  Next, remove your foot/feet from solution, blot dry the affected area.    4.  Apply light amount of antibiotic cream/ointment and you may cover with fabric band-aid.  5.  This soak should be done once a day for 3 days.   6.  Monitor for any signs/symptoms of infection such as redness, swelling, odor, drainage, increased pain, or non-healing of digit.   7.  Please do not hesitate to call the office and speak to a Nurse or Doctor if you have questions.   8.  If you experience fever, chills, nightsweats, nausea or vomiting with worsening of digit, please go to the emergency room.

## 2020-12-04 ENCOUNTER — Encounter (INDEPENDENT_AMBULATORY_CARE_PROVIDER_SITE_OTHER): Payer: Self-pay | Admitting: Ophthalmology

## 2020-12-04 ENCOUNTER — Other Ambulatory Visit: Payer: Self-pay

## 2020-12-04 ENCOUNTER — Ambulatory Visit (INDEPENDENT_AMBULATORY_CARE_PROVIDER_SITE_OTHER): Payer: Medicare HMO | Admitting: Ophthalmology

## 2020-12-04 DIAGNOSIS — H3322 Serous retinal detachment, left eye: Secondary | ICD-10-CM | POA: Insufficient documentation

## 2020-12-04 DIAGNOSIS — H43822 Vitreomacular adhesion, left eye: Secondary | ICD-10-CM

## 2020-12-04 DIAGNOSIS — H35372 Puckering of macula, left eye: Secondary | ICD-10-CM

## 2020-12-04 DIAGNOSIS — H35352 Cystoid macular degeneration, left eye: Secondary | ICD-10-CM | POA: Diagnosis not present

## 2020-12-04 DIAGNOSIS — H401123 Primary open-angle glaucoma, left eye, severe stage: Secondary | ICD-10-CM | POA: Insufficient documentation

## 2020-12-04 DIAGNOSIS — E119 Type 2 diabetes mellitus without complications: Secondary | ICD-10-CM | POA: Diagnosis not present

## 2020-12-04 NOTE — Assessment & Plan Note (Signed)
Seen on OCT nasal aspect of the macula

## 2020-12-04 NOTE — Progress Notes (Signed)
12/04/2020     CHIEF COMPLAINT Patient presents for Retina Evaluation (NP - chronic CME OS s/p CEIOL - Ref'd by Dr. Gerald Stabs Groat//Pt c/o VA OS "not quite up to par" x 9 months since CE OS. Pt denies blur, however. Pt c/o intermittent redness OS. Pt denies flashes or floaters OU./A1c: 7.0, 01/22/LBS: 190 last night)   HISTORY OF PRESENT ILLNESS: Aaron Carlson is a 87 y.o. male who presents to the clinic today for:   HPI    Retina Evaluation    In left eye.  This started 9 months ago.  Duration of 9 months.  Response to treatment was no improvement. Additional comments: NP - chronic CME OS s/p CEIOL - Ref'd by Dr. Midge Aver  Pt c/o VA OS "not quite up to par" x 9 months since CE OS. Pt denies blur, however. Pt c/o intermittent redness OS. Pt denies flashes or floaters OU. A1c: 7.0, 01/22 LBS: 190 last night       Last edited by Rockie Neighbours, [place] on 12/04/2020 10:11 AM. (History)      Referring physician: Antony Contras, MD [address],  [place] [id number]  HISTORICAL INFORMATION:   Selected notes from the MEDICAL RECORD NUMBER    Lab Results  Component Value Date   HGBA1C 7.5 (H) 11/04/2006     CURRENT MEDICATIONS: Current Outpatient Medications (Ophthalmic Drugs)  Medication Sig  . fluorometholone (FML) 0.1 % ophthalmic suspension 1 drop into affected eye  . fluorometholone (FML) 0.1 % ophthalmic suspension   . latanoprost (XALATAN) 0.005 % ophthalmic solution Place 1 drop into both eyes at bedtime.  Marland Kitchen ketorolac (ACULAR) 0.4 % SOLN Place 1 drop into the left eye 4 (four) times daily.  . Latanoprostene Bunod (VYZULTA) 0.024 % SOLN 1 drop into affected eye in the evening  . prednisoLONE acetate (PRED FORTE) 1 % ophthalmic suspension 1 drop in the morning, at noon, and at bedtime.  Dema Severin Petrolatum-Mineral Oil (ARTIFICIAL TEARS) ointment Place into the left eye in the morning, at noon, and at bedtime.   No current facility-administered medications  for this visit. (Ophthalmic Drugs)   Current Outpatient Medications (Other)  Medication Sig  . Accu-Chek Softclix Lancets lancets   . Alcohol Swabs 70 % PADS use when checking blood sugar  . atorvastatin (LIPITOR) 20 MG tablet 1 tablet  . BD INSULIN SYRINGE ULTRAFINE 31G X 5/16" 0.5 ML MISC Use as directed  . benazepril (LOTENSIN) 20 MG tablet 1 tablet  . bismuth subsalicylate (PEPTO BISMOL) 262 MG/15ML suspension Take 30 mLs by mouth every 6 (six) hours as needed.  . Blood Glucose Monitoring Suppl (TRUE METRIX METER) w/Device KIT   . calcium carbonate (TUMS - DOSED IN MG ELEMENTAL CALCIUM) 500 MG chewable tablet Chew 1 tablet by mouth daily as needed for indigestion or heartburn.  . fluticasone (FLONASE) 50 MCG/ACT nasal spray Place into both nostrils daily as needed for allergies or rhinitis.  . Fluticasone Propionate (FLONASE NA)   . furosemide (LASIX) 80 MG tablet 1 tablet  . Garlic 147 MG TABS Take 1 tablet by mouth daily.  . insulin lispro (HUMALOG) 100 UNIT/ML injection Use as directed daily at bedtime as directed per sliding scale.  . insulin NPH Human (HUMULIN N,NOVOLIN N) 100 UNIT/ML injection Inject 38 Units into the skin at bedtime.   Marland Kitchen ipratropium (ATROVENT) 0.03 % nasal spray Place into the nose.  . Iron Combinations (CHROMAGEN) capsule Take 1 capsule by mouth daily.  Marland Kitchen  isosorbide mononitrate (IMDUR) 60 MG 24 hr tablet Take 1 tablet (60 mg total) by mouth daily.  Marland Kitchen MAGNESIUM PO Take 1 tablet by mouth daily.  . metoprolol succinate (TOPROL-XL) 50 MG 24 hr tablet Take 1.5 tablets (75 mg total) by mouth daily. Take with or immediately following a meal.  . Multiple Vitamin (M.V.I. ADULT IV) 1 tablet  . Multiple Vitamin (MULTIVITAMIN) tablet Take 1 tablet by mouth daily.  . niacin (NIASPAN) 500 MG CR tablet Take 500 mg by mouth at bedtime.  . niacin (SLO-NIACIN) 500 MG tablet 1 tablet with food  . Omega-3 Fatty Acids (FISH OIL) 1000 MG CAPS 1 capsule  . omeprazole (PRILOSEC) 20  MG capsule Take 20 mg by mouth daily.  . ONE TOUCH ULTRA TEST test strip Use as directed  . pantoprazole (PROTONIX) 40 MG tablet Take 1 tablet (40 mg total) by mouth daily.  . potassium chloride (KLOR-CON) 20 MEQ packet Take 10 mEq by mouth daily.   . simethicone (MYLICON) 409 MG chewable tablet Chew 125 mg by mouth every 6 (six) hours as needed for flatulence.  . TRULICITY 8.11 BJ/4.7WG SOPN    No current facility-administered medications for this visit. (Other)      REVIEW OF SYSTEMS:    ALLERGIES Allergies  Allergen Reactions  . Penicillins Rash    PAST MEDICAL HISTORY Past Medical History:  Diagnosis Date  . CAD in native artery 08/28/2013   Heaviness in chest with exertion. Coronary angiography 2005 demonstrated severe distal LAD and diagonal disease, severe distal RCA and PL OM disease, and severe first obtuse marginal disease. No high-grade proximal coronary disease was present at the time.  The most recent myocardial perfusion study in 2014 was nonischemic/low risk    . Chronic renal insufficiency   . Colon polyps    adenomatous  . Diabetes mellitus   . Hyperlipemia   . Hypertension   . Laryngeal papillomatosis   . Prostate cancer ([place])    w/ mets to bladder   Past Surgical History:  Procedure Laterality Date  . BLADDER SURGERY    . CATARACT EXTRACTION Left 02/2020  . CIRCUMCISION  2011  . POLYPECTOMY     vocal cords  . PROSTATE SURGERY      FAMILY HISTORY Family History  Problem Relation Age of Onset  . Breast cancer Mother   . Diabetes Mother   . Heart failure Father   . Heart disease Father   . Pancreatic cancer Brother   . Colon cancer Neg Hx   . Esophageal cancer Neg Hx   . Liver disease Neg Hx     SOCIAL HISTORY Social History   Tobacco Use  . Smoking status: Former Research scientist (life sciences)  . Smokeless tobacco: Never Used  Vaping Use  . Vaping Use: Never used  Substance Use Topics  . Alcohol use: Yes    Comment: social  . Drug use: No          OPHTHALMIC EXAM:  Base Eye Exam    Visual Acuity (ETDRS)      Right Left   Dist cc 20/20 -2 20/100   Dist ph cc  20/50 +1   Correction: Glasses       Tonometry (Tonopen, 10:13 AM)      Right Left   Pressure 09 09       Pupils      Pupils Dark Light Shape React APD   Right PERRL 3 2 Round Brisk None   Left PERRL 3 2  Round Brisk None       Visual Fields (Counting fingers)      Left Right    Full Full       Extraocular Movement      Right Left    Full Full       Neuro/Psych    Oriented x3: Yes   Mood/Affect: Normal       Dilation    Both eyes: 1.0% Mydriacyl, 2.5% Phenylephrine @ 10:17 AM        Slit Lamp and Fundus Exam    External Exam      Right Left   External Normal Normal       Slit Lamp Exam      Right Left   Lids/Lashes Normal Normal   Conjunctiva/Sclera White and quiet White and quiet   Cornea Clear Clear   Anterior Chamber Deep and quiet Deep and quiet   Iris Round and reactive Round and reactive   Lens 2+ Nuclear sclerosis Clear   Anterior Vitreous Normal Normal       Fundus Exam      Right Left   Posterior Vitreous Normal Apparent vitreomacular adhesion seen in a ringlike fashion elevation on the epiretinal membrane the macula   Disc Thin rim Thin rim   C/D Ratio 0.65 0.65   Macula Normal Cystoid macular edema, Epiretinal membrane with apparent vitreomacular adhesion   Vessels no DR no DR   Periphery Normal Normal          IMAGING AND PROCEDURES  Imaging and Procedures for 12/04/20  OCT, Retina - OU - Both Eyes       Right Eye Quality was good. Scan locations included subfoveal. Central Foveal Thickness: 247. Progression has no prior data. Findings include normal foveal contour.   Left Eye Quality was good. Scan locations included subfoveal. Central Foveal Thickness: 453. Progression has no prior data. Findings include vitreomacular adhesion , vitreous traction, cystoid macular edema.                  ASSESSMENT/PLAN:  Cystoid macular edema, left eye Pseudophakic cystoid macular edema left eye, longstanding, 9 months post cataract extraction with intraocular lens placement left eye.  Clearly there appears to be a role by OCT of vitreomacular adhesion and traction.  This also appears to be evident on clinical examination with a 90 diopter and a contact lens  Vitreomacular traction syndrome of left eye Vitreomacular traction may cause vision loss from anatomic distortion to the center of the vision, the macula.  If visual function is symptomatic or threatened, therapy may be needed.  Surgical intervention offers the highest chance of visual stability and improvement.  Distortion of the macula anatomy may cause splitting of the retinal layers, termed foveomacular retinoschisis, which can cause more permanent vision loss.  Epiretinal membranes may also be associated.  Macular hole may also develop if vitreomacular traction progresses. The minor form of this condition is Vitreomacular adhesion, which is a natural change in the aging process of the eye, which requires observation only.  This appears to be the major feature of the Ongoing cystoid macular edema left eye and subfoveal serous elevation also with a glistening epiretinal membrane nasally OS  Primary open angle glaucoma of left eye, severe stage Patient instructed to continue on topical glaucoma therapy as prescribed elsewhere  Left epiretinal membrane Seen on OCT nasal aspect of the macula  Serous retinal detachment, left eye Will treat with topical NSAID and follow  ICD-10-CM   1. Cystoid macular edema, left eye  H35.352 OCT, Retina - OU - Both Eyes  2. Vitreomacular traction syndrome of left eye  H43.822 OCT, Retina - OU - Both Eyes  3. Primary open angle glaucoma of left eye, severe stage  H40.1123   4. Diabetes mellitus without complication ([place])  Z79.1   5. Left epiretinal membrane  H35.372   6. Serous retinal  detachment, left eye  H33.22     1.  Patient instructed to continue on topical glaucoma medications as per Dr. Warden Fillers  2.  OS, will instruct the patient to discontinue the FML (pink top) or the medication that exits the bottle with milky color.  I experience topical NSAID use alone tends to work better if pseudophakic CME exist without vitreous traction.  We will monitor this over the ensuing 1 to 2 weeks and if CME improves, vitreomacular traction may not be as common as a contributing factor as I suspect  3.  Follow-up in 1 week with wide-field fluorescein angiography to confirm the absence of significant detectable diabetic retinopathy and/or vasculitis  Ophthalmic Meds Ordered this visit:  No orders of the defined types were placed in this encounter.      Return in about 1 week (around 12/11/2020) for DILATE OU, OCT, OPTOS FFA L/R, COLOR FP.  There are no Patient Instructions on file for this visit.   Explained the diagnoses, plan, and follow up with the patient and they expressed understanding.  Patient expressed understanding of the importance of proper follow up care.   Clent Demark Rankin M.D. Diseases & Surgery of the Retina and Vitreous [facility] 12/04/20     Abbreviations: M myopia (nearsighted); A astigmatism; H hyperopia (farsighted); P presbyopia; Mrx spectacle prescription;  CTL contact lenses; OD right eye; OS left eye; OU both eyes  XT exotropia; ET esotropia; PEK punctate epithelial keratitis; PEE punctate epithelial erosions; DES dry eye syndrome; MGD meibomian gland dysfunction; ATs artificial tears; PFAT's preservative free artificial tears; [place] nuclear sclerotic cataract; PSC posterior subcapsular cataract; ERM epi-retinal membrane; PVD posterior vitreous detachment; RD retinal detachment; DM diabetes mellitus; DR diabetic retinopathy; NPDR non-proliferative diabetic retinopathy; PDR proliferative diabetic retinopathy; CSME clinically  significant macular edema; DME diabetic macular edema; dbh dot blot hemorrhages; CWS cotton wool spot; POAG primary open angle glaucoma; C/D cup-to-disc ratio; HVF humphrey visual field; GVF goldmann visual field; OCT optical coherence tomography; IOP intraocular pressure; BRVO Branch retinal vein occlusion; CRVO central retinal vein occlusion; CRAO central retinal artery occlusion; BRAO branch retinal artery occlusion; RT retinal tear; SB scleral buckle; PPV pars plana vitrectomy; VH Vitreous hemorrhage; PRP panretinal laser photocoagulation; IVK intravitreal kenalog; VMT vitreomacular traction; MH Macular hole;  NVD neovascularization of the disc; NVE neovascularization elsewhere; AREDS age related eye disease study; ARMD age related macular degeneration; POAG primary open angle glaucoma; EBMD epithelial/anterior basement membrane dystrophy; ACIOL anterior chamber intraocular lens; IOL intraocular lens; PCIOL posterior chamber intraocular lens; Phaco/IOL phacoemulsification with intraocular lens placement; [place] photorefractive keratectomy; LASIK laser assisted in situ keratomileusis; HTN hypertension; DM diabetes mellitus; COPD chronic obstructive pulmonary disease

## 2020-12-04 NOTE — Assessment & Plan Note (Signed)
Pseudophakic cystoid macular edema left eye, longstanding, 9 months post cataract extraction with intraocular lens placement left eye.  Clearly there appears to be a role by OCT of vitreomacular adhesion and traction.  This also appears to be evident on clinical examination with a 90 diopter and a contact lens

## 2020-12-04 NOTE — Assessment & Plan Note (Signed)
Patient instructed to continue on topical glaucoma therapy as prescribed elsewhere

## 2020-12-04 NOTE — Assessment & Plan Note (Signed)
Will treat with topical NSAID and follow

## 2020-12-04 NOTE — Assessment & Plan Note (Addendum)
Vitreomacular traction may cause vision loss from anatomic distortion to the center of the vision, the macula.  If visual function is symptomatic or threatened, therapy may be needed.  Surgical intervention offers the highest chance of visual stability and improvement.  Distortion of the macula anatomy may cause splitting of the retinal layers, termed foveomacular retinoschisis, which can cause more permanent vision loss.  Epiretinal membranes may also be associated.  Macular hole may also develop if vitreomacular traction progresses. The minor form of this condition is Vitreomacular adhesion, which is a natural change in the aging process of the eye, which requires observation only.  This appears to be the major feature of the Ongoing cystoid macular edema left eye and subfoveal serous elevation also with a glistening epiretinal membrane nasally OS

## 2020-12-06 ENCOUNTER — Other Ambulatory Visit: Payer: Self-pay

## 2020-12-06 ENCOUNTER — Ambulatory Visit (HOSPITAL_COMMUNITY)
Admission: RE | Admit: 2020-12-06 | Discharge: 2020-12-06 | Disposition: A | Discharge status: Home / Self Care | Payer: Medicare HMO | Source: Ambulatory Visit | Attending: Internal Medicine | Admitting: Internal Medicine

## 2020-12-06 ENCOUNTER — Ambulatory Visit (HOSPITAL_BASED_OUTPATIENT_CLINIC_OR_DEPARTMENT_OTHER): Payer: Medicare HMO

## 2020-12-06 ENCOUNTER — Telehealth: Payer: Self-pay

## 2020-12-06 DIAGNOSIS — I5032 Chronic diastolic (congestive) heart failure: Secondary | ICD-10-CM | POA: Insufficient documentation

## 2020-12-06 DIAGNOSIS — I712 Thoracic aortic aneurysm, without rupture: Secondary | ICD-10-CM | POA: Diagnosis not present

## 2020-12-06 DIAGNOSIS — Z8551 Personal history of malignant neoplasm of bladder: Secondary | ICD-10-CM | POA: Diagnosis not present

## 2020-12-06 DIAGNOSIS — R1013 Epigastric pain: Secondary | ICD-10-CM | POA: Insufficient documentation

## 2020-12-06 DIAGNOSIS — I714 Abdominal aortic aneurysm, without rupture: Secondary | ICD-10-CM | POA: Diagnosis not present

## 2020-12-06 DIAGNOSIS — G8929 Other chronic pain: Secondary | ICD-10-CM

## 2020-12-06 DIAGNOSIS — Z8546 Personal history of malignant neoplasm of prostate: Secondary | ICD-10-CM | POA: Diagnosis not present

## 2020-12-06 LAB — ECHOCARDIOGRAM COMPLETE
Area-P 1/2: 2.56 cm2
S' Lateral: 3.5 cm

## 2020-12-06 LAB — POCT I-STAT CREATININE: Creatinine, Ser: 2.3 mg/dL — ABNORMAL HIGH (ref 0.61–1.24)

## 2020-12-06 MED ORDER — IOHEXOL 300 MG/ML  SOLN: 100.0000 mL | Freq: Once | INTRAMUSCULAR | Status: DC | PRN

## 2020-12-06 NOTE — Telephone Encounter (Signed)
Change order under Dr. Henrene Pastor to CT AP without contrast

## 2020-12-06 NOTE — Telephone Encounter (Signed)
Aaron Carlson at [facility] for CT of A/P. Creatinine today is 2.3. Radiology calling wanting to change order to without contrast. Please advise as DOD.  Irene Shipper, MD to Me     11/14/20 10:36 AM Note 1.  He has functional dyspepsia.  Unlikely that any remedy is going to help.  He has had this problem for many years and has seen many gastroenterologists 2.  However, just to make sure all the bases are covered and there is no other problem to be concerned with, schedule contrast-enhanced CT scan of the abdomen "epigastric pain, evaluate"

## 2020-12-06 NOTE — Telephone Encounter (Signed)
Order changed. Radiology needs Dr. Henrene Pastor to sign off on the order. Let them know he is out of the office but will sign off on Monday. Dr. Henrene Pastor aware.

## 2020-12-07 NOTE — Progress Notes (Signed)
  Subjective:  Patient ID: Aaron Carlson, male    DOB: [birth year],  MRN: [id number]  87 y.o. male presents with at risk foot care. Pt has h/o NIDDM with PAD and painful thick toenails that are difficult to trim. Pain interferes with ambulation. Aggravating factors include wearing enclosed shoe gear. Pain is relieved with periodic professional debridement.  Patient's blood sugar was 191 mg/dl on last night.  Patient relates b/l great toe pain stating they ache and keep him awake at night. Denies any redness, drainage or swelling. Denies any fever, chills, night sweats, nausea or vomiting.  PCP: Antony Contras, MD and last visit was:   Review of Systems: Negative except as noted in the HPI.   Allergies  Allergen Reactions  . Penicillins Rash    Objective:  There were no vitals filed for this visit. Constitutional Patient is a pleasant 87 y.o. African American male in NAD. AAO x 3.  Vascular Capillary refill time to digits immediate b/l. Faintly palpable DP pulse(s) b/l lower extremities. Nonpalpable PT pulse(s) b/l lower extremities. Pedal hair absent. Lower extremity skin temperature gradient within normal limits. No cyanosis or clubbing noted.  Neurologic Normal speech. Protective sensation intact 5/5 intact bilaterally with 10g monofilament b/l.  Dermatologic Pedal skin with normal turgor, texture and tone bilaterally. No open wounds bilaterally. No interdigital macerations bilaterally. Toenails 1-5 b/l elongated, discolored, dystrophic, thickened, crumbly with subungual debris and tenderness to dorsal palpation. Incurvated nailplate medial border(s) L hallux and R hallux.  Nail border hypertrophy present. There is tenderness to palpation. Sign(s) of infection: no clinical signs of infection noted on examination today.. Porokeratotic lesion(s) submet head 2 left foot and submet head 2 right foot. No erythema, no edema, no drainage, no fluctuance.  Orthopedic: Normal muscle strength 5/5 to  all lower extremity muscle groups bilaterally. No pain crepitus or joint limitation noted with ROM b/l. Hammertoes noted to the L 2nd toe and R 2nd toe.   No flowsheet data found.     Assessment:   1. Onychomycosis of multiple toenails with type 2 diabetes mellitus and peripheral angiopathy ([place])   2. Ingrown toenail without infection   3. Porokeratosis   4. Type II diabetes mellitus with peripheral circulatory disorder [facility])    Plan:  Patient was evaluated and treated and all questions answered.  Onychomycosis with pain -Nails palliatively debridement as below. -Educated on self-care  Procedure: Nail Debridement Rationale: Pain Type of Debridement: manual, sharp debridement. Instrumentation: Nail nipper, rotary burr. Number of Nails: 10  -Examined patient. -Continue diabetic foot care principles. -Patient to continue soft, supportive shoe gear daily. -Toenails 1-5 b/l were debrided in length and girth with sterile nail nippers and dremel without iatrogenic bleeding.  -Offending nail border debrided and curretaged L hallux and R hallux utilizing sterile nail nipper and currette. Border(s) cleansed with alcohol and triple antibiotic ointment applied. Dispensed written instructions for once daily epsom salt soaks for 3 days. -Painful porokeratotic lesion(s) submet head 2 left foot and submet head 2 right foot pared and enucleated with sterile scalpel blade without incident. -Patient to report any pedal injuries to medical professional immediately. -Patient/POA to call should there be question/concern in the interim.  Return in about 3 months (around 03/01/2021).  Marzetta Board, DPM

## 2020-12-07 NOTE — Progress Notes (Signed)
Cardiology Office Note:    Date:  12/09/2020   ID:  Aaron Carlson, DOB Nov 08, 1932, MRN [id number]  PCP:  Antony Contras, MD  Cardiologist:  Sinclair Grooms, MD   Referring MD: Antony Contras, MD   Chief Complaint  Patient presents with  . Coronary Artery Disease  . Congestive Heart Failure  . Chest Pain    History of Present Illness:    Aaron Carlson is a 87 y.o. 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  Having significant heartburn each night.  Gets up 2-3 times per night to drink water and take an acids.  Takes 30 minutes or so for the discomfort to improve.  Occasionally physical activity will bring the heartburn on.  He describes as a burning type feeling in his chest.  He denies orthopnea, PND, and lower extremity swelling.  Past Medical History:  Diagnosis Date  . CAD in native artery 08/28/2013   Heaviness in chest with exertion. Coronary angiography 2005 demonstrated severe distal LAD and diagonal disease, severe distal RCA and PL OM disease, and severe first obtuse marginal disease. No high-grade proximal coronary disease was present at the time.  The most recent myocardial perfusion study in 2014 was nonischemic/low risk    . Chronic renal insufficiency   . Colon polyps    adenomatous  . Diabetes mellitus   . Hyperlipemia   . Hypertension   . Laryngeal papillomatosis   . Prostate cancer ([place])    w/ mets to bladder    Past Surgical History:  Procedure Laterality Date  . BLADDER SURGERY    . CATARACT EXTRACTION Left 02/2020  . CIRCUMCISION  2011  . POLYPECTOMY     vocal cords  . PROSTATE SURGERY      Current Medications: Current Meds  Medication Sig  . Accu-Chek Softclix Lancets lancets   . Alcohol Swabs 70 % PADS use when checking blood sugar  . atorvastatin  (LIPITOR) 20 MG tablet 1 tablet  . BD INSULIN SYRINGE ULTRAFINE 31G X 5/16" 0.5 ML MISC Use as directed  . benazepril (LOTENSIN) 20 MG tablet 1 tablet  . bismuth subsalicylate (PEPTO BISMOL) 262 MG/15ML suspension Take 30 mLs by mouth every 6 (six) hours as needed.  . Blood Glucose Monitoring Suppl (TRUE METRIX METER) w/Device KIT   . calcium carbonate (TUMS - DOSED IN MG ELEMENTAL CALCIUM) 500 MG chewable tablet Chew 1 tablet by mouth daily as needed for indigestion or heartburn.  . fluorometholone (FML) 0.1 % ophthalmic suspension 1 drop into affected eye  . fluorometholone (FML) 0.1 % ophthalmic suspension   . fluticasone (FLONASE) 50 MCG/ACT nasal spray Place into both nostrils daily as needed for allergies or rhinitis.  . Fluticasone Propionate (FLONASE NA)   . furosemide (LASIX) 80 MG tablet Take 80 mg by mouth daily.  . Garlic 956 MG TABS Take 1 tablet by mouth daily.  . insulin lispro (HUMALOG) 100 UNIT/ML injection Use as directed daily at bedtime as directed per sliding scale.  . insulin NPH Human (HUMULIN N,NOVOLIN N) 100 UNIT/ML injection Inject 38 Units into the skin at bedtime.   Marland Kitchen ipratropium (ATROVENT) 0.03 % nasal spray Place into the nose.  . Iron Combinations (CHROMAGEN) capsule Take 1 capsule by mouth daily.  Marland Kitchen ketorolac (ACULAR) 0.4 % SOLN Place 1 drop into the left eye 4 (four) times daily.  Marland Kitchen  latanoprost (XALATAN) 0.005 % ophthalmic solution Place 1 drop into both eyes at bedtime.  . Latanoprostene Bunod (VYZULTA) 0.024 % SOLN 1 drop into affected eye in the evening  . MAGNESIUM PO Take 1 tablet by mouth daily.  . metoprolol succinate (TOPROL-XL) 50 MG 24 hr tablet Take 1.5 tablets (75 mg total) by mouth daily. Take with or immediately following a meal.  . Multiple Vitamin (M.V.I. ADULT IV) 1 tablet  . Multiple Vitamin (MULTIVITAMIN) tablet Take 1 tablet by mouth daily.  . niacin (NIASPAN) 500 MG CR tablet Take 500 mg by mouth at bedtime.  . niacin (SLO-NIACIN) 500 MG  tablet 1 tablet with food  . Omega-3 Fatty Acids (FISH OIL) 1000 MG CAPS 1 capsule  . omeprazole (PRILOSEC) 20 MG capsule Take 20 mg by mouth daily.  . ONE TOUCH ULTRA TEST test strip Use as directed  . pantoprazole (PROTONIX) 40 MG tablet Take 1 tablet (40 mg total) by mouth daily.  . potassium chloride (KLOR-CON) 20 MEQ packet Take 20 mEq by mouth daily.  . prednisoLONE acetate (PRED FORTE) 1 % ophthalmic suspension 1 drop in the morning, at noon, and at bedtime.  . simethicone (MYLICON) 161 MG chewable tablet Chew 125 mg by mouth every 6 (six) hours as needed for flatulence.  . TRULICITY 0.96 EA/5.4UJ SOPN   . White Petrolatum-Mineral Oil (ARTIFICIAL TEARS) ointment Place into the left eye in the morning, at noon, and at bedtime.     Allergies:   Penicillins   Social History   Socioeconomic History  . Marital status: Married    Spouse name: Not on file  . Number of children: 4  . Years of education: Not on file  . Highest education level: Not on file  Occupational History  . Occupation: Retired  Tobacco Use  . Smoking status: Former Research scientist (life sciences)  . Smokeless tobacco: Never Used  Vaping Use  . Vaping Use: Never used  Substance and Sexual Activity  . Alcohol use: Yes    Comment: social  . Drug use: No  . Sexual activity: Not on file  Other Topics Concern  . Not on file  Social History Narrative  . Not on file   Social Determinants of Health   Financial Resource Strain: Not on file  Food Insecurity: Not on file  Transportation Needs: Not on file  Physical Activity: Not on file  Stress: Not on file  Social Connections: Not on file     Family History: The patient's family history includes Breast cancer in his mother; Diabetes in his mother; Heart disease in his father; Heart failure in his father; Pancreatic cancer in his brother. There is no history of Colon cancer, Esophageal cancer, or Liver disease.  ROS:   Please see the history of present illness.    Notes he has  underlying chronic kidney disease.  Working with Dr. Henrene Pastor at [facility] and GI.  Still having a lot of symptoms.  All other systems reviewed and are negative.  EKGs/Labs/Other Studies Reviewed:    The following studies were reviewed today:  ECHOCARDIOGRAM 12/06/20: IMPRESSIONS    1. Left ventricular ejection fraction, by estimation, is 50%. The left ventricle has low normal function. The left ventricle demonstrates regional wall motion abnormalities in the apex. There is mild concentric left ventricular hypertrophy. Left  ventricular diastolic parameters are consistent with Grade I diastolic dysfunction (impaired relaxation). The average left ventricular global longitudinal strain is -12.5 %. The global longitudinal strain is abnormal, potentially suggesting further  decrease in wall motion that is missed secondary to lack of echo-contrast. Consider further imaging or additional studies if clinically indicated.  2. Right ventricular systolic function is normal. The right ventricular size is normal.  3. The mitral valve is normal in structure. No evidence of mitral valve regurgitation.  4. The aortic valve is tricuspid. There is mild calcification of the aortic valve. There is mild thickening of the aortic valve. Aortic valve regurgitation is not visualized. Mild aortic valve sclerosis is present, with no evidence of aortic valve  stenosis.  5. The inferior vena cava is normal in size with greater than 50% respiratory variability, suggesting right atrial pressure of 3 mmHg.  Comparison(s): No prior Echocardiogram.   EKG:  EKG sinus rhythm, right bundle, left anterior hemiblock, and when compared to prior but otherwise no significant has occurred and has stable bifascicular block.  Recent Labs: 12/06/2020: Creatinine, Ser 2.30  Recent Lipid Panel    Component Value Date/Time   CHOL 119 08/16/2006 0816   TRIG 101 08/16/2006 0816   HDL 42.4 08/16/2006 0816   CHOLHDL 2.8 CALC 08/16/2006  0816   VLDL 20 08/16/2006 0816   LDLCALC 56 08/16/2006 0816    Physical Exam:    VS:  BP 124/60   Pulse 66   Ht $R'5\' 8"'lA$  (1.727 m)   Wt 210 lb (95.3 kg)   SpO2 98%   BMI 31.93 kg/m     Wt Readings from Last 3 Encounters:  12/09/20 210 lb (95.3 kg)  11/05/20 223 lb (101.2 kg)  10/02/20 223 lb 6.4 oz (101.3 kg)     GEN: Obese. No acute distress HEENT: Normal NECK: No JVD. LYMPHATICS: No lymphadenopathy CARDIAC: No murmur. RRR no gallop, or edema. VASCULAR:  Normal Pulses. No bruits. RESPIRATORY:  Clear to auscultation without rales, wheezing or rhonchi  ABDOMEN: Soft, non-tender, non-distended, No pulsatile mass, MUSCULOSKELETAL: No deformity  SKIN: Warm and dry NEUROLOGIC:  Alert and oriented x 3 PSYCHIATRIC:  Normal affect   ASSESSMENT:    1. Coronary artery disease involving native coronary artery of native heart with angina pectoris ([place])   2. Essential hypertension   3. Chronic diastolic heart failure ([place])   4. Mixed hyperlipidemia   5. Stage 3b chronic kidney disease ([place])   6. RBBB (right bundle branch block with left anterior fascicular block)    PLAN:    In order of problems listed above:  1. Secondary prevention discussed.  Heartburn at night may be angina.  Try sublingual nitroglycerin to see if it helps.  If nitro improves the discomfort, need to optimize Imdur dose to 120 mg/day and consider adding additional anti-ischemic therapy. 2. Blood pressure is currently adequate on multiple agents. 3. No evidence of volume overload.  Low normal LV systolic function by echo at 50%. 4. Continue statin therapy 5. Continue to follow-up with Dr. Hollie Salk of [facility] 6. No changes noted  Overall education and awareness concerning secondary risk prevention was discussed in detail: LDL less than 70, hemoglobin A1c less than 7, blood pressure target less than 130/80 mmHg, >150 minutes of moderate aerobic activity per week, avoidance of smoking, weight  control (via diet and exercise), and continued surveillance/management of/for obstructive sleep apnea.  If it turns out that indigestion is due to ischemic heart disease, will further increase isosorbide and consider uptitrating beta-blocker if tolerable.    Medication Adjustments/Labs and Tests Ordered: Current medicines are reviewed at length with the patient today.  Concerns regarding medicines are  outlined above.  Orders Placed This Encounter  Procedures  . EKG 12-Lead   No orders of the defined types were placed in this encounter.   There are no Patient Instructions on file for this visit.   Signed, Sinclair Grooms, MD  12/09/2020 11:19 AM    [place]

## 2020-12-09 ENCOUNTER — Ambulatory Visit: Payer: Medicare HMO | Admitting: Interventional Cardiology

## 2020-12-09 ENCOUNTER — Telehealth: Payer: Self-pay | Admitting: Internal Medicine

## 2020-12-09 ENCOUNTER — Encounter: Payer: Self-pay | Admitting: Interventional Cardiology

## 2020-12-09 ENCOUNTER — Other Ambulatory Visit: Payer: Self-pay

## 2020-12-09 VITALS — BP 124/60 | HR 66 | Ht 68.0 in | Wt 210.0 lb

## 2020-12-09 DIAGNOSIS — N1832 Chronic kidney disease, stage 3b: Secondary | ICD-10-CM | POA: Diagnosis not present

## 2020-12-09 DIAGNOSIS — I5032 Chronic diastolic (congestive) heart failure: Secondary | ICD-10-CM | POA: Diagnosis not present

## 2020-12-09 DIAGNOSIS — E782 Mixed hyperlipidemia: Secondary | ICD-10-CM

## 2020-12-09 DIAGNOSIS — I10 Essential (primary) hypertension: Secondary | ICD-10-CM | POA: Diagnosis not present

## 2020-12-09 DIAGNOSIS — I452 Bifascicular block: Secondary | ICD-10-CM

## 2020-12-09 DIAGNOSIS — I25119 Atherosclerotic heart disease of native coronary artery with unspecified angina pectoris: Secondary | ICD-10-CM

## 2020-12-09 MED ORDER — NITROGLYCERIN 0.4 MG SL SUBL: 0.4000 mg | SUBLINGUAL_TABLET | SUBLINGUAL | 3 refills | Status: AC | PRN

## 2020-12-09 NOTE — Patient Instructions (Signed)
Medication Instructions:  1)  A prescription has been sent in for Nitroglycerin.  If you have chest pain that doesn't relieve quickly, place one tablet under your tongue and allow it to dissolve.  If no relief after 5 minutes, you may take another pill.  If no relief after 5 minutes, you may take a 3rd dose but you need to call 911 and report to ER immediately.  *If you need a refill on your cardiac medications before your next appointment, please call your pharmacy*   Lab Work: None If you have labs (blood work) drawn today and your tests are completely normal, you will receive your results only by: Marland Kitchen MyChart Message (if you have MyChart) OR . A paper copy in the mail If you have any lab test that is abnormal or we need to change your treatment, we will call you to review the results.   Testing/Procedures: None   Follow-Up: At [facility], you and your health needs are our priority.  As part of our continuing mission to provide you with exceptional heart care, we have created designated Provider Care Teams.  These Care Teams include your primary Cardiologist (physician) and Advanced Practice Providers (APPs -  Physician Assistants and Nurse Practitioners) who all work together to provide you with the care you need, when you need it.  We recommend signing up for the patient portal called "MyChart".  Sign up information is provided on this After Visit Summary.  MyChart is used to connect with patients for Virtual Visits (Telemedicine).  Patients are able to view lab/test results, encounter notes, upcoming appointments, etc.  Non-urgent messages can be sent to your provider as well.   To learn more about what you can do with MyChart, go to [email or web address].    Your next appointment:   6 month(s)  The format for your next appointment:   In Person  Provider:   You may see Sinclair Grooms, MD or one of the following Advanced Practice Providers on your designated Care Team:     Kathyrn Drown, NP    Other Instructions

## 2020-12-10 NOTE — Telephone Encounter (Signed)
Error

## 2020-12-10 NOTE — Telephone Encounter (Signed)
Pt wanted a copy of his CT scan, report mailed to pt. He also wanted to know how often he was to take IBgurad. Reviewed with him he is to take 1 cap with meals.

## 2020-12-11 ENCOUNTER — Ambulatory Visit (INDEPENDENT_AMBULATORY_CARE_PROVIDER_SITE_OTHER): Payer: Medicare HMO | Admitting: Ophthalmology

## 2020-12-11 ENCOUNTER — Other Ambulatory Visit: Payer: Self-pay

## 2020-12-11 ENCOUNTER — Encounter (INDEPENDENT_AMBULATORY_CARE_PROVIDER_SITE_OTHER): Payer: Self-pay | Admitting: Ophthalmology

## 2020-12-11 DIAGNOSIS — H35352 Cystoid macular degeneration, left eye: Secondary | ICD-10-CM

## 2020-12-11 MED ORDER — FLUORESCEIN SODIUM 10 % IV SOLN
500.0000 mg | INTRAVENOUS | Status: AC | PRN
  Administered 2020-12-11: 500 mg via INTRAVENOUS

## 2020-12-11 MED ORDER — KETOROLAC TROMETHAMINE 0.4 % OP SOLN: 1.0000 [drp] | Freq: Four times a day (QID) | OPHTHALMIC | 1 refills | Status: DC

## 2020-12-11 NOTE — Assessment & Plan Note (Signed)
OS much improved only 7 days after cessation of topical steroid and continuation of topical NSAID use alone OS  We will asked patient to continue on topical NSAID, ketorolac 1 drop left eye 4 times daily

## 2020-12-11 NOTE — Addendum Note (Signed)
Addended by: Deloria Lair A on: 12/11/2020 11:45 AM   Modules accepted: Orders

## 2020-12-11 NOTE — Progress Notes (Signed)
12/11/2020     CHIEF COMPLAINT Patient presents for Retina Follow Up (1 Week CME f\u. FFA L/R, FP and OCT/Pt feels vision is better. Denies complaints.)   HISTORY OF PRESENT ILLNESS: Aaron Carlson is a 87 y.o. male who presents to the clinic today for:   HPI    Retina Follow Up    Diagnosis: CME.  In left eye.  Severity is moderate.  Duration of 1 week.  Since onset it is stable.  I, the attending physician,  performed the HPI with the patient and updated documentation appropriately. Additional comments: 1 Week CME f\u. FFA L/R, FP and OCT Pt feels vision is better. Denies complaints.       Last edited by Tilda Franco on 12/11/2020 10:49 AM. (History)      Referring physician: Warden Fillers, MD [address] [place],  [place] [id number]  HISTORICAL INFORMATION:   Selected notes from the MEDICAL RECORD NUMBER    Lab Results  Component Value Date   HGBA1C 7.5 (H) 11/04/2006     CURRENT MEDICATIONS: Current Outpatient Medications (Ophthalmic Drugs)  Medication Sig  . fluorometholone (FML) 0.1 % ophthalmic suspension 1 drop into affected eye  . fluorometholone (FML) 0.1 % ophthalmic suspension   . ketorolac (ACULAR) 0.4 % SOLN Place 1 drop into the left eye 4 (four) times daily.  Marland Kitchen latanoprost (XALATAN) 0.005 % ophthalmic solution Place 1 drop into both eyes at bedtime.  . Latanoprostene Bunod (VYZULTA) 0.024 % SOLN 1 drop into affected eye in the evening  . prednisoLONE acetate (PRED FORTE) 1 % ophthalmic suspension 1 drop in the morning, at noon, and at bedtime.  Dema Severin Petrolatum-Mineral Oil (ARTIFICIAL TEARS) ointment Place into the left eye in the morning, at noon, and at bedtime.   No current facility-administered medications for this visit. (Ophthalmic Drugs)   Current Outpatient Medications (Other)  Medication Sig  . Accu-Chek Softclix Lancets lancets   . Alcohol Swabs 70 % PADS use when checking blood sugar  . atorvastatin (LIPITOR) 20 MG  tablet 1 tablet  . BD INSULIN SYRINGE ULTRAFINE 31G X 5/16" 0.5 ML MISC Use as directed  . benazepril (LOTENSIN) 20 MG tablet 1 tablet  . bismuth subsalicylate (PEPTO BISMOL) 262 MG/15ML suspension Take 30 mLs by mouth every 6 (six) hours as needed.  . Blood Glucose Monitoring Suppl (TRUE METRIX METER) w/Device KIT   . calcium carbonate (TUMS - DOSED IN MG ELEMENTAL CALCIUM) 500 MG chewable tablet Chew 1 tablet by mouth daily as needed for indigestion or heartburn.  . fluticasone (FLONASE) 50 MCG/ACT nasal spray Place into both nostrils daily as needed for allergies or rhinitis.  . Fluticasone Propionate (FLONASE NA)   . furosemide (LASIX) 80 MG tablet Take 80 mg by mouth daily.  . Garlic 409 MG TABS Take 1 tablet by mouth daily.  . insulin lispro (HUMALOG) 100 UNIT/ML injection Use as directed daily at bedtime as directed per sliding scale.  . insulin NPH Human (HUMULIN N,NOVOLIN N) 100 UNIT/ML injection Inject 38 Units into the skin at bedtime.   Marland Kitchen ipratropium (ATROVENT) 0.03 % nasal spray Place into the nose.  . Iron Combinations (CHROMAGEN) capsule Take 1 capsule by mouth daily.  . isosorbide mononitrate (IMDUR) 60 MG 24 hr tablet Take 1 tablet (60 mg total) by mouth daily.  Marland Kitchen MAGNESIUM PO Take 1 tablet by mouth daily.  . metoprolol succinate (TOPROL-XL) 50 MG 24 hr tablet Take 1.5 tablets (75 mg total)  by mouth daily. Take with or immediately following a meal.  . Multiple Vitamin (M.V.I. ADULT IV) 1 tablet  . Multiple Vitamin (MULTIVITAMIN) tablet Take 1 tablet by mouth daily.  . niacin (NIASPAN) 500 MG CR tablet Take 500 mg by mouth at bedtime.  . niacin (SLO-NIACIN) 500 MG tablet 1 tablet with food  . nitroGLYCERIN (NITROSTAT) 0.4 MG SL tablet Place 1 tablet (0.4 mg total) under the tongue every 5 (five) minutes as needed for chest pain.  . Omega-3 Fatty Acids (FISH OIL) 1000 MG CAPS 1 capsule  . omeprazole (PRILOSEC) 20 MG capsule Take 20 mg by mouth daily.  . ONE TOUCH ULTRA TEST  test strip Use as directed  . pantoprazole (PROTONIX) 40 MG tablet Take 1 tablet (40 mg total) by mouth daily.  . potassium chloride (KLOR-CON) 20 MEQ packet Take 20 mEq by mouth daily.  . simethicone (MYLICON) 476 MG chewable tablet Chew 125 mg by mouth every 6 (six) hours as needed for flatulence.  . TRULICITY 5.46 TK/3.5WS SOPN    No current facility-administered medications for this visit. (Other)      REVIEW OF SYSTEMS:    ALLERGIES Allergies  Allergen Reactions  . Penicillins Rash    PAST MEDICAL HISTORY Past Medical History:  Diagnosis Date  . CAD in native artery 08/28/2013   Heaviness in chest with exertion. Coronary angiography 2005 demonstrated severe distal LAD and diagonal disease, severe distal RCA and PL OM disease, and severe first obtuse marginal disease. No high-grade proximal coronary disease was present at the time.  The most recent myocardial perfusion study in 2014 was nonischemic/low risk    . Chronic renal insufficiency   . Colon polyps    adenomatous  . Diabetes mellitus   . Hyperlipemia   . Hypertension   . Laryngeal papillomatosis   . Prostate cancer ([place])    w/ mets to bladder   Past Surgical History:  Procedure Laterality Date  . BLADDER SURGERY    . CATARACT EXTRACTION Left 02/2020  . CIRCUMCISION  2011  . POLYPECTOMY     vocal cords  . PROSTATE SURGERY      FAMILY HISTORY Family History  Problem Relation Age of Onset  . Breast cancer Mother   . Diabetes Mother   . Heart failure Father   . Heart disease Father   . Pancreatic cancer Brother   . Colon cancer Neg Hx   . Esophageal cancer Neg Hx   . Liver disease Neg Hx     SOCIAL HISTORY Social History   Tobacco Use  . Smoking status: Former Research scientist (life sciences)  . Smokeless tobacco: Never Used  Vaping Use  . Vaping Use: Never used  Substance Use Topics  . Alcohol use: Yes    Comment: social  . Drug use: No         OPHTHALMIC EXAM:  Base Eye Exam    Visual Acuity (Snellen -  Linear)      Right Left   Dist cc 20/25 -1 20/50 -1   Dist ph cc  20/40 -2   Correction: Glasses       Tonometry (Tonopen, 10:54 AM)      Right Left   Pressure 18 15       Pupils      Pupils Dark Light Shape React APD   Right PERRL 3 2 Round Brisk None   Left PERRL 3 2 Round Brisk None       Neuro/Psych    Oriented x3:  Yes   Mood/Affect: Normal       Dilation    Both eyes: 1.0% Mydriacyl, 2.5% Phenylephrine @ 10:54 AM        Slit Lamp and Fundus Exam    External Exam      Right Left   External Normal Normal       Slit Lamp Exam      Right Left   Lids/Lashes Normal Normal   Conjunctiva/Sclera White and quiet White and quiet   Cornea Clear Clear   Anterior Chamber Deep and quiet Deep and quiet   Iris Round and reactive Round and reactive   Lens 2+ Nuclear sclerosis Clear   Anterior Vitreous Normal Normal       Fundus Exam      Right Left   Posterior Vitreous Normal Apparent vitreomacular adhesion seen in a ringlike fashion elevation on the epiretinal membrane the macula   Disc Thin rim Thin rim   C/D Ratio 0.65 0.65   Macula Normal Cystoid macular edema, Epiretinal membrane with apparent vitreomacular adhesion   Vessels no DR no DR   Periphery Normal Normal          IMAGING AND PROCEDURES  Imaging and Procedures for 12/11/20  OCT, Retina - OU - Both Eyes       Right Eye Quality was good. Scan locations included subfoveal. Central Foveal Thickness: 247. Progression has been stable.   Left Eye Quality was good. Scan locations included subfoveal. Central Foveal Thickness: 378. Progression has improved. Findings include abnormal foveal contour, cystoid macular edema.   Notes OS much improved only 7 days after cessation of topical steroid and continuation of topical NSAID use alone OS       Color Fundus Photography Optos - OU - Both Eyes       Right Eye Progression has no prior data. Disc findings include increased cup to disc ratio. Vessels :  normal observations. Periphery : normal observations.   Left Eye Progression has no prior data. Disc findings include increased cup to disc ratio. Macula : edema. Vessels : normal observations. Periphery : normal observations.        Fluorescein Angiography Optos (Transit OS)       Injection:  500 mg Fluorescein Sodium 10 % injection   NDC: [phone]   Route: IntravenousRight Eye   Progression has no prior data. Mid/Late phase findings include normal observations. Choroidal neovascularization is not present.   Left Eye   Progression has no prior data. Early phase findings include leakage. Mid/Late phase findings include leakage. Choroidal neovascularization is not present.   Notes Fluorescein angiography confirms the presence of typical classic pseudophakic cystoid macular edema of the left eye.                ASSESSMENT/PLAN:  Cystoid macular edema, left eye OS much improved only 7 days after cessation of topical steroid and continuation of topical NSAID use alone OS  We will asked patient to continue on topical NSAID, ketorolac 1 drop left eye 4 times daily      ICD-10-CM   1. Cystoid macular edema, left eye  H35.352 OCT, Retina - OU - Both Eyes    Color Fundus Photography Optos - OU - Both Eyes    Fluorescein Angiography Optos (Transit OS)    Fluorescein Sodium 10 % injection 500 mg    1.  Pseudophakic cystoid MAC edema left eye confirmed by fluorescein angiography.  By OCT and clinical findings and by acuity the the  condition is improving only 7 days after cessation of topical FML and use of topical NSAID, ketorolac 1 drop 4 times daily left eye, and used alone without other topical therapy.  I will asked the patient return for follow-up visit in 3 to 4 weeks to reassess as currently CME is already improving nicely  2.  Patient to continue on topical ketorolac (gray top) 1 drop left eye 4 times daily  3.  Also showed me a bottle of SIMBRINZA as per Dr.  Midge Aver, and I asked the patient to continue that medication as per Dr. Velvet Bathe instructions  Ophthalmic Meds Ordered this visit:  Meds ordered this encounter  Medications  . Fluorescein Sodium 10 % injection 500 mg       Return in about 3 weeks (around 01/01/2021) for dilate, OS, OCT.  There are no Patient Instructions on file for this visit.   Explained the diagnoses, plan, and follow up with the patient and they expressed understanding.  Patient expressed understanding of the importance of proper follow up care.   Clent Demark Ripley Bogosian M.D. Diseases & Surgery of the Retina and Vitreous Retina & Diabetic [place] 12/11/20     Abbreviations: M myopia (nearsighted); A astigmatism; H hyperopia (farsighted); P presbyopia; Mrx spectacle prescription;  CTL contact lenses; OD right eye; OS left eye; OU both eyes  XT exotropia; ET esotropia; PEK punctate epithelial keratitis; PEE punctate epithelial erosions; DES dry eye syndrome; MGD meibomian gland dysfunction; ATs artificial tears; PFAT's preservative free artificial tears; [place] nuclear sclerotic cataract; PSC posterior subcapsular cataract; ERM epi-retinal membrane; PVD posterior vitreous detachment; RD retinal detachment; DM diabetes mellitus; DR diabetic retinopathy; NPDR non-proliferative diabetic retinopathy; PDR proliferative diabetic retinopathy; CSME clinically significant macular edema; DME diabetic macular edema; dbh dot blot hemorrhages; CWS cotton wool spot; POAG primary open angle glaucoma; C/D cup-to-disc ratio; HVF humphrey visual field; GVF goldmann visual field; OCT optical coherence tomography; IOP intraocular pressure; BRVO Branch retinal vein occlusion; CRVO central retinal vein occlusion; CRAO central retinal artery occlusion; BRAO branch retinal artery occlusion; RT retinal tear; SB scleral buckle; PPV pars plana vitrectomy; VH Vitreous hemorrhage; PRP panretinal laser photocoagulation; IVK intravitreal kenalog; VMT  vitreomacular traction; MH Macular hole;  NVD neovascularization of the disc; NVE neovascularization elsewhere; AREDS age related eye disease study; ARMD age related macular degeneration; POAG primary open angle glaucoma; EBMD epithelial/anterior basement membrane dystrophy; ACIOL anterior chamber intraocular lens; IOL intraocular lens; PCIOL posterior chamber intraocular lens; Phaco/IOL phacoemulsification with intraocular lens placement; [place] photorefractive keratectomy; LASIK laser assisted in situ keratomileusis; HTN hypertension; DM diabetes mellitus; COPD chronic obstructive pulmonary disease

## 2020-12-26 DIAGNOSIS — E1165 Type 2 diabetes mellitus with hyperglycemia: Secondary | ICD-10-CM | POA: Diagnosis not present

## 2020-12-26 DIAGNOSIS — E785 Hyperlipidemia, unspecified: Secondary | ICD-10-CM | POA: Diagnosis not present

## 2020-12-26 DIAGNOSIS — N1832 Chronic kidney disease, stage 3b: Secondary | ICD-10-CM | POA: Diagnosis not present

## 2020-12-26 DIAGNOSIS — E1122 Type 2 diabetes mellitus with diabetic chronic kidney disease: Secondary | ICD-10-CM | POA: Diagnosis not present

## 2020-12-26 DIAGNOSIS — I130 Hypertensive heart and chronic kidney disease with heart failure and stage 1 through stage 4 chronic kidney disease, or unspecified chronic kidney disease: Secondary | ICD-10-CM | POA: Diagnosis not present

## 2020-12-26 DIAGNOSIS — I5030 Unspecified diastolic (congestive) heart failure: Secondary | ICD-10-CM | POA: Diagnosis not present

## 2020-12-26 DIAGNOSIS — I2510 Atherosclerotic heart disease of native coronary artery without angina pectoris: Secondary | ICD-10-CM | POA: Diagnosis not present

## 2020-12-26 DIAGNOSIS — D649 Anemia, unspecified: Secondary | ICD-10-CM | POA: Diagnosis not present

## 2020-12-26 DIAGNOSIS — E1129 Type 2 diabetes mellitus with other diabetic kidney complication: Secondary | ICD-10-CM | POA: Diagnosis not present

## 2021-01-01 ENCOUNTER — Encounter (INDEPENDENT_AMBULATORY_CARE_PROVIDER_SITE_OTHER): Payer: Self-pay | Admitting: Ophthalmology

## 2021-01-01 ENCOUNTER — Ambulatory Visit (INDEPENDENT_AMBULATORY_CARE_PROVIDER_SITE_OTHER): Payer: Medicare HMO | Admitting: Ophthalmology

## 2021-01-01 ENCOUNTER — Other Ambulatory Visit: Payer: Self-pay

## 2021-01-01 DIAGNOSIS — H35352 Cystoid macular degeneration, left eye: Secondary | ICD-10-CM

## 2021-01-01 DIAGNOSIS — H401123 Primary open-angle glaucoma, left eye, severe stage: Secondary | ICD-10-CM

## 2021-01-01 NOTE — Assessment & Plan Note (Signed)
Documented on initial finding 12-04-2020 and now some 28 to 30 days later vastly improved CME OS only on topical ketorolac while continuing glaucoma therapy nightly  We will continue on topical ketorolac twice daily for now and have the patient come back in 6 to 8 weeks to monitor ongoing result.  I am hopeful that we will 1-Day be able to discontinue this new medication.

## 2021-01-01 NOTE — Assessment & Plan Note (Signed)
Continue on glaucoma management as scheduled

## 2021-01-01 NOTE — Progress Notes (Signed)
01/01/2021     CHIEF COMPLAINT Patient presents for Retina Follow Up (3 Week CME f\u OS. OCT/Pt states OS vision is more clear. Using gtts as directed.)   HISTORY OF PRESENT ILLNESS: Aaron Carlson is a 87 y.o. male who presents to the clinic today for:   HPI    Retina Follow Up    Diagnosis: CME.  In left eye.  Severity is moderate.  Duration of 3 weeks.  Since onset it is stable.  I, the attending physician,  performed the HPI with the patient and updated documentation appropriately. Additional comments: 3 Week CME f\u OS. OCT Pt states OS vision is more clear. Using gtts as directed.       Last edited by Tilda Franco on 01/01/2021  8:56 AM. (History)      Referring physician: Antony Contras, MD [address] [place],  [place] [id number]  HISTORICAL INFORMATION:   Selected notes from the MEDICAL RECORD NUMBER    Lab Results  Component Value Date   HGBA1C 7.5 (H) 11/04/2006     CURRENT MEDICATIONS: Current Outpatient Medications (Ophthalmic Drugs)  Medication Sig  . fluorometholone (FML) 0.1 % ophthalmic suspension 1 drop into affected eye  . fluorometholone (FML) 0.1 % ophthalmic suspension   . ketorolac (ACULAR) 0.4 % SOLN Place 1 drop into the left eye 4 (four) times daily.  Marland Kitchen latanoprost (XALATAN) 0.005 % ophthalmic solution Place 1 drop into both eyes at bedtime.  . Latanoprostene Bunod (VYZULTA) 0.024 % SOLN 1 drop into affected eye in the evening  . White Petrolatum-Mineral Oil (ARTIFICIAL TEARS) ointment Place into the left eye in the morning, at noon, and at bedtime.   No current facility-administered medications for this visit. (Ophthalmic Drugs)   Current Outpatient Medications (Other)  Medication Sig  . Accu-Chek Softclix Lancets lancets   . Alcohol Swabs 70 % PADS use when checking blood sugar  . atorvastatin (LIPITOR) 20 MG tablet 1 tablet  . BD INSULIN SYRINGE ULTRAFINE 31G X 5/16" 0.5 ML MISC Use as directed  . benazepril (LOTENSIN)  20 MG tablet 1 tablet  . bismuth subsalicylate (PEPTO BISMOL) 262 MG/15ML suspension Take 30 mLs by mouth every 6 (six) hours as needed.  . Blood Glucose Monitoring Suppl (TRUE METRIX METER) w/Device KIT   . calcium carbonate (TUMS - DOSED IN MG ELEMENTAL CALCIUM) 500 MG chewable tablet Chew 1 tablet by mouth daily as needed for indigestion or heartburn.  . fluticasone (FLONASE) 50 MCG/ACT nasal spray Place into both nostrils daily as needed for allergies or rhinitis.  . Fluticasone Propionate (FLONASE NA)   . furosemide (LASIX) 80 MG tablet Take 80 mg by mouth daily.  . Garlic 211 MG TABS Take 1 tablet by mouth daily.  . insulin lispro (HUMALOG) 100 UNIT/ML injection Use as directed daily at bedtime as directed per sliding scale.  . insulin NPH Human (HUMULIN N,NOVOLIN N) 100 UNIT/ML injection Inject 38 Units into the skin at bedtime.   Marland Kitchen ipratropium (ATROVENT) 0.03 % nasal spray Place into the nose.  . Iron Combinations (CHROMAGEN) capsule Take 1 capsule by mouth daily.  . isosorbide mononitrate (IMDUR) 60 MG 24 hr tablet Take 1 tablet (60 mg total) by mouth daily.  Marland Kitchen MAGNESIUM PO Take 1 tablet by mouth daily.  . metoprolol succinate (TOPROL-XL) 50 MG 24 hr tablet Take 1.5 tablets (75 mg total) by mouth daily. Take with or immediately following a meal.  . Multiple Vitamin (M.V.I. ADULT IV)  1 tablet  . Multiple Vitamin (MULTIVITAMIN) tablet Take 1 tablet by mouth daily.  . niacin (NIASPAN) 500 MG CR tablet Take 500 mg by mouth at bedtime.  . niacin (SLO-NIACIN) 500 MG tablet 1 tablet with food  . nitroGLYCERIN (NITROSTAT) 0.4 MG SL tablet Place 1 tablet (0.4 mg total) under the tongue every 5 (five) minutes as needed for chest pain.  . Omega-3 Fatty Acids (FISH OIL) 1000 MG CAPS 1 capsule  . omeprazole (PRILOSEC) 20 MG capsule Take 20 mg by mouth daily.  . ONE TOUCH ULTRA TEST test strip Use as directed  . pantoprazole (PROTONIX) 40 MG tablet Take 1 tablet (40 mg total) by mouth daily.  .  potassium chloride (KLOR-CON) 20 MEQ packet Take 20 mEq by mouth daily.  . simethicone (MYLICON) 382 MG chewable tablet Chew 125 mg by mouth every 6 (six) hours as needed for flatulence.  . TRULICITY 5.05 LZ/7.6BH SOPN    No current facility-administered medications for this visit. (Other)      REVIEW OF SYSTEMS:    ALLERGIES Allergies  Allergen Reactions  . Penicillins Rash    PAST MEDICAL HISTORY Past Medical History:  Diagnosis Date  . CAD in native artery 08/28/2013   Heaviness in chest with exertion. Coronary angiography 2005 demonstrated severe distal LAD and diagonal disease, severe distal RCA and PL OM disease, and severe first obtuse marginal disease. No high-grade proximal coronary disease was present at the time.  The most recent myocardial perfusion study in 2014 was nonischemic/low risk    . Chronic renal insufficiency   . Colon polyps    adenomatous  . Diabetes mellitus   . Hyperlipemia   . Hypertension   . Laryngeal papillomatosis   . Prostate cancer ([place])    w/ mets to bladder   Past Surgical History:  Procedure Laterality Date  . BLADDER SURGERY    . CATARACT EXTRACTION Left 02/2020  . CIRCUMCISION  2011  . POLYPECTOMY     vocal cords  . PROSTATE SURGERY      FAMILY HISTORY Family History  Problem Relation Age of Onset  . Breast cancer Mother   . Diabetes Mother   . Heart failure Father   . Heart disease Father   . Pancreatic cancer Brother   . Colon cancer Neg Hx   . Esophageal cancer Neg Hx   . Liver disease Neg Hx     SOCIAL HISTORY Social History   Tobacco Use  . Smoking status: Former Research scientist (life sciences)  . Smokeless tobacco: Never Used  Vaping Use  . Vaping Use: Never used  Substance Use Topics  . Alcohol use: Yes    Comment: social  . Drug use: No         OPHTHALMIC EXAM: Base Eye Exam    Visual Acuity (Snellen - Linear)      Right Left   Dist cc 20/20 -1 20/50 -2   Dist ph cc  20/30   Correction: Glasses       Tonometry  (Tonopen, 9:01 AM)      Right Left   Pressure 10 12       Pupils      Pupils Dark Light Shape React APD   Right PERRL 3 2 Round Brisk None   Left PERRL 3 2 Round Brisk None       Visual Fields (Counting fingers)      Left Right    Full Full       Neuro/Psych  Oriented x3: Yes   Mood/Affect: Normal       Dilation    Left eye: 1.0% Mydriacyl, 2.5% Phenylephrine @ 9:01 AM        Slit Lamp and Fundus Exam    External Exam      Right Left   External Normal Normal       Slit Lamp Exam      Right Left   Lids/Lashes Normal Normal   Conjunctiva/Sclera White and quiet White and quiet   Cornea Clear Clear   Anterior Chamber Deep and quiet Deep and quiet   Iris Round and reactive Round and reactive   Lens 2+ Nuclear sclerosis Clear   Anterior Vitreous Normal Normal       Fundus Exam      Right Left   Posterior Vitreous  Apparent vitreomacular adhesion seen in a ringlike fashion elevation on the epiretinal membrane the macula   Disc  Thin rim   C/D Ratio  0.65   Macula  Epiretinal membrane with apparent vitreomacular adhesion, no macular thickening   Vessels  no DR   Periphery  Normal          IMAGING AND PROCEDURES  Imaging and Procedures for 01/01/21  OCT, Retina - OU - Both Eyes       Right Eye Quality was good. Scan locations included subfoveal. Central Foveal Thickness: 247. Progression has been stable.   Left Eye Quality was good. Scan locations included subfoveal. Central Foveal Thickness: 312. Progression has improved. Findings include abnormal foveal contour, cystoid macular edema.   Notes OS much improved only 30 days after cessation of topical steroid and continuation of topical NSAID use alone OS, ketorolac 1 drop left eye 4 times daily                  ASSESSMENT/PLAN:  Cystoid macular edema, left eye Documented on initial finding 12-04-2020 and now some 28 to 30 days later vastly improved CME OS only on topical ketorolac while  continuing glaucoma therapy nightly  We will continue on topical ketorolac twice daily for now and have the patient come back in 6 to 8 weeks to monitor ongoing result.  I am hopeful that we will 1-Day be able to discontinue this new medication.  Primary open angle glaucoma of left eye, severe stage Continue on glaucoma management as scheduled      ICD-10-CM   1. Cystoid macular edema, left eye  H35.352 OCT, Retina - OU - Both Eyes  2. Primary open angle glaucoma of left eye, severe stage  H40.1123     1.  Overall vastly improved acuity and anatomy OS from pseudophakic CME.  Successful therapy over the last 28 to 30 days with ketorolac left eye 4 times daily topically without use of concomitant topical steroid.  I explained the outcome to the patient and displayed the OCT pictures for his understanding.  We will attempt to use topical ketorolac twice daily over the next 8 weeks to maintain this outcome  2.  Patient instructed to continue on topical glaucoma therapy as instructed elsewhere  3.  Ophthalmic Meds Ordered this visit:  No orders of the defined types were placed in this encounter.      Return in about 8 weeks (around 02/26/2021) for dilate, OS, OCT.  There are no Patient Instructions on file for this visit.   Explained the diagnoses, plan, and follow up with the patient and they expressed understanding.  Patient expressed understanding of the  importance of proper follow up care.   Clent Demark Terance Pomplun M.D. Diseases & Surgery of the Retina and Vitreous Retina & Diabetic [place] 01/01/21     Abbreviations: M myopia (nearsighted); A astigmatism; H hyperopia (farsighted); P presbyopia; Mrx spectacle prescription;  CTL contact lenses; OD right eye; OS left eye; OU both eyes  XT exotropia; ET esotropia; PEK punctate epithelial keratitis; PEE punctate epithelial erosions; DES dry eye syndrome; MGD meibomian gland dysfunction; ATs artificial tears; PFAT's preservative free  artificial tears; [place] nuclear sclerotic cataract; PSC posterior subcapsular cataract; ERM epi-retinal membrane; PVD posterior vitreous detachment; RD retinal detachment; DM diabetes mellitus; DR diabetic retinopathy; NPDR non-proliferative diabetic retinopathy; PDR proliferative diabetic retinopathy; CSME clinically significant macular edema; DME diabetic macular edema; dbh dot blot hemorrhages; CWS cotton wool spot; POAG primary open angle glaucoma; C/D cup-to-disc ratio; HVF humphrey visual field; GVF goldmann visual field; OCT optical coherence tomography; IOP intraocular pressure; BRVO Branch retinal vein occlusion; CRVO central retinal vein occlusion; CRAO central retinal artery occlusion; BRAO branch retinal artery occlusion; RT retinal tear; SB scleral buckle; PPV pars plana vitrectomy; VH Vitreous hemorrhage; PRP panretinal laser photocoagulation; IVK intravitreal kenalog; VMT vitreomacular traction; MH Macular hole;  NVD neovascularization of the disc; NVE neovascularization elsewhere; AREDS age related eye disease study; ARMD age related macular degeneration; POAG primary open angle glaucoma; EBMD epithelial/anterior basement membrane dystrophy; ACIOL anterior chamber intraocular lens; IOL intraocular lens; PCIOL posterior chamber intraocular lens; Phaco/IOL phacoemulsification with intraocular lens placement; [place] photorefractive keratectomy; LASIK laser assisted in situ keratomileusis; HTN hypertension; DM diabetes mellitus; COPD chronic obstructive pulmonary disease

## 2021-01-06 ENCOUNTER — Other Ambulatory Visit (INDEPENDENT_AMBULATORY_CARE_PROVIDER_SITE_OTHER): Payer: Self-pay | Admitting: Ophthalmology

## 2021-01-29 ENCOUNTER — Ambulatory Visit: Payer: Medicare HMO | Admitting: Internal Medicine

## 2021-01-29 ENCOUNTER — Encounter: Payer: Self-pay | Admitting: Internal Medicine

## 2021-01-29 VITALS — BP 120/56 | HR 60 | Ht 68.0 in | Wt 210.4 lb

## 2021-01-29 DIAGNOSIS — K2100 Gastro-esophageal reflux disease with esophagitis, without bleeding: Secondary | ICD-10-CM | POA: Diagnosis not present

## 2021-01-29 DIAGNOSIS — K589 Irritable bowel syndrome without diarrhea: Secondary | ICD-10-CM

## 2021-01-29 DIAGNOSIS — K8020 Calculus of gallbladder without cholecystitis without obstruction: Secondary | ICD-10-CM | POA: Diagnosis not present

## 2021-01-29 DIAGNOSIS — R1013 Epigastric pain: Secondary | ICD-10-CM | POA: Diagnosis not present

## 2021-01-29 NOTE — Patient Instructions (Signed)
If you are age 85 or older, your body mass index should be between 23-30. Your Body mass index is 31.99 kg/m. If this is out of the aforementioned range listed, please consider follow up with your Primary Care Provider.  If you are age 51 or younger, your body mass index should be between 19-25. Your Body mass index is 31.99 kg/m. If this is out of the aformentioned range listed, please consider follow up with your Primary Care Provider.   Continue taking your IB Gard.  Please follow up in 6 months

## 2021-01-29 NOTE — Progress Notes (Signed)
HISTORY OF PRESENT ILLNESS:  Aaron Carlson is a 87 y.o. male, retired Company secretary, with chronic functional dyspepsia associated with bloating and belching, GERD, and occasional diarrhea.  He was last seen in this office October 02, 2020 regarding the same.  See that dictation for details.  He was prescribed pantoprazole 40 mg daily.  He was treated with probiotic.  Upper endoscopy was scheduled and performed November 05, 2020.  The examination was normal.  Because of ongoing complaints, he underwent CT scan of the abdomen and pelvis December 06, 2020.  There were no acute abnormalities.  Incidental cholelithiasis noted.  It was recommended that he initiate IBgard before meals and follow-up at this time.  He is pleased to report that he is doing significantly better.  As long as he takes his IBgard before meals, he does well.  If he misses a dose, he has some gastric distress.  Most part his reflux symptoms are controlled though he will have occasional breakthrough at night for which he takes Gaviscon.  He denies true abdominal pain.  No new complaints  REVIEW OF SYSTEMS:  All non-GI ROS negative as otherwise stated in the HPI except for insomnia, urinary leakage, itching, muscle cramps, cough  Past Medical History:  Diagnosis Date  . CAD in native artery 08/28/2013   Heaviness in chest with exertion. Coronary angiography 2005 demonstrated severe distal LAD and diagonal disease, severe distal RCA and PL OM disease, and severe first obtuse marginal disease. No high-grade proximal coronary disease was present at the time.  The most recent myocardial perfusion study in 2014 was nonischemic/low risk    . Chronic renal insufficiency   . Colon polyps    adenomatous  . Diabetes mellitus   . Hyperlipemia   . Hypertension   . Laryngeal papillomatosis   . Prostate cancer ([place])    w/ mets to bladder    Past Surgical History:  Procedure Laterality Date  . BLADDER SURGERY    . CATARACT EXTRACTION Left 02/2020   . CIRCUMCISION  2011  . POLYPECTOMY     vocal cords  . [place]  reports that he has quit smoking. He has never used smokeless tobacco. He reports current alcohol use. He reports that he does not use drugs.  family history includes Breast cancer in his mother; Diabetes in his mother; Heart disease in his father; Heart failure in his father; Pancreatic cancer in his brother.  Allergies  Allergen Reactions  . Penicillins Rash       PHYSICAL EXAMINATION: Vital signs: BP (!) 120/56   Pulse 60   Ht 5\' 8"  (1.727 m)   Wt 210 lb 6.4 oz (95.4 kg)   BMI 31.99 kg/m   Constitutional: generally well-appearing, no acute distress Psychiatric: alert and oriented x3, cooperative Eyes: extraocular movements intact, anicteric, conjunctiva pink Mouth: oral pharynx moist, no lesions Neck: supple no lymphadenopathy Cardiovascular: heart regular rate and rhythm, no murmur Lungs: clear to auscultation bilaterally Abdomen: soft, nontender, nondistended, no obvious ascites, no peritoneal signs, normal bowel sounds, no organomegaly Rectal: Omitted Extremities: no clubbing, cyanosis, or lower extremity edema bilaterally Skin: no lesions on visible extremities Neuro: No focal deficits.  Cranial nerves intact  ASSESSMENT:  1.  Functional dyspepsia.  Normal recent EGD.  CT negative for acute abnormalities.  Doing better with IBgard before meals. 2.  Cholelithiasis.  Felt to be incidental at this point 3.  GERD.  Doing better on  pantoprazole.  Occasional breakthrough at night   PLAN:  1.  Continue IBgard 2.  Reviewed his work-up in detail as well as my clinical impressions and recommendations. 3.  Reflux precautions 4.  Continue pantoprazole 5.  Gaviscon at night as needed 6.  GI follow-up 6 months Total time of 30 minutes was spent preparing to see the patient, obtaining history, performing physical exam, counseling the patient regarding his above  issues, directing medical therapy, arranging follow-up, and documenting clinical information in the health record

## 2021-02-06 ENCOUNTER — Other Ambulatory Visit: Payer: Self-pay

## 2021-02-06 MED ORDER — METOPROLOL SUCCINATE ER 50 MG PO TB24: 75.0000 mg | ORAL_TABLET | Freq: Every day | ORAL | 3 refills | Status: DC

## 2021-02-06 NOTE — Telephone Encounter (Signed)
Pt's medication was sent to pt's pharmacy as requested. Confirmation received.  °

## 2021-02-11 DIAGNOSIS — I2510 Atherosclerotic heart disease of native coronary artery without angina pectoris: Secondary | ICD-10-CM | POA: Diagnosis not present

## 2021-02-11 DIAGNOSIS — E669 Obesity, unspecified: Secondary | ICD-10-CM | POA: Diagnosis not present

## 2021-02-11 DIAGNOSIS — E1122 Type 2 diabetes mellitus with diabetic chronic kidney disease: Secondary | ICD-10-CM | POA: Diagnosis not present

## 2021-02-11 DIAGNOSIS — Z794 Long term (current) use of insulin: Secondary | ICD-10-CM | POA: Diagnosis not present

## 2021-02-19 ENCOUNTER — Other Ambulatory Visit: Payer: Self-pay

## 2021-02-19 MED ORDER — METOPROLOL SUCCINATE ER 50 MG PO TB24: 75.0000 mg | ORAL_TABLET | Freq: Every day | ORAL | 0 refills | Status: DC

## 2021-02-25 ENCOUNTER — Telehealth: Payer: Self-pay | Admitting: *Deleted

## 2021-02-25 NOTE — Telephone Encounter (Signed)
Can you get him scheduled sooner?

## 2021-02-25 NOTE — Telephone Encounter (Signed)
Patient is calling because his left great toe is still giving him problems(aching,on /off) since last office visit.Please schedule for sooner appointment.

## 2021-02-26 ENCOUNTER — Encounter (INDEPENDENT_AMBULATORY_CARE_PROVIDER_SITE_OTHER): Payer: Medicare HMO | Admitting: Ophthalmology

## 2021-02-26 ENCOUNTER — Ambulatory Visit (INDEPENDENT_AMBULATORY_CARE_PROVIDER_SITE_OTHER): Payer: Medicare HMO | Admitting: Ophthalmology

## 2021-02-26 ENCOUNTER — Other Ambulatory Visit: Payer: Self-pay

## 2021-02-26 ENCOUNTER — Encounter (INDEPENDENT_AMBULATORY_CARE_PROVIDER_SITE_OTHER): Payer: Self-pay | Admitting: Ophthalmology

## 2021-02-26 DIAGNOSIS — H35352 Cystoid macular degeneration, left eye: Secondary | ICD-10-CM | POA: Diagnosis not present

## 2021-02-26 DIAGNOSIS — H401123 Primary open-angle glaucoma, left eye, severe stage: Secondary | ICD-10-CM

## 2021-02-26 DIAGNOSIS — H35372 Puckering of macula, left eye: Secondary | ICD-10-CM | POA: Diagnosis not present

## 2021-02-26 DIAGNOSIS — E119 Type 2 diabetes mellitus without complications: Secondary | ICD-10-CM

## 2021-02-26 NOTE — Progress Notes (Signed)
02/26/2021     CHIEF COMPLAINT Patient presents for Retina Evaluation (OS follow-up for prior CME and epiretinal membrane left eye.  Patient reports compliance with using FML topically left eye as well as ketorolac 4 times daily OS topically, since the last visit, 11 weeks prior)   HISTORY OF PRESENT ILLNESS: Aaron Carlson is a 87 y.o. male who presents to the clinic today for:   HPI    Retina Evaluation    Laterality: left eye   Comments: OS follow-up for prior CME and epiretinal membrane left eye.  Patient reports compliance with using FML topically left eye as well as ketorolac 4 times daily OS topically, since the last visit, 11 weeks prior       Last edited by Edmon Crape, MD on 02/26/2021 11:02 AM. (History)      Referring physician: Tally Joe, MD [phone] [place],  [place] [address]  HISTORICAL INFORMATION:   Selected notes from the MEDICAL RECORD NUMBER    Lab Results  Component Value Date   HGBA1C 7.5 (H) 11/04/2006     CURRENT MEDICATIONS: Current Outpatient Medications (Ophthalmic Drugs)  Medication Sig  . fluorometholone (FML) 0.1 % ophthalmic suspension 1 drop into affected eye  . fluorometholone (FML) 0.1 % ophthalmic suspension   . ketorolac (ACULAR) 0.4 % SOLN INSTILL 1 DROP INTO LEFT EYE FOUR TIMES DAILY  . latanoprost (XALATAN) 0.005 % ophthalmic solution Place 1 drop into both eyes at bedtime.  . Latanoprostene Bunod (VYZULTA) 0.024 % SOLN 1 drop into affected eye in the evening  . White Petrolatum-Mineral Oil (ARTIFICIAL TEARS) ointment Place into the left eye in the morning, at noon, and at bedtime.   No current facility-administered medications for this visit. (Ophthalmic Drugs)   Current Outpatient Medications (Other)  Medication Sig  . Accu-Chek Softclix Lancets lancets   . Alcohol Swabs 70 % PADS use when checking blood sugar  . atorvastatin (LIPITOR) 20 MG tablet 1 tablet  . BD INSULIN SYRINGE ULTRAFINE 31G X 5/16"  0.5 ML MISC Use as directed  . benazepril (LOTENSIN) 20 MG tablet 1 tablet  . bismuth subsalicylate (PEPTO BISMOL) 262 MG/15ML suspension Take 30 mLs by mouth every 6 (six) hours as needed.  . Blood Glucose Monitoring Suppl (TRUE METRIX METER) w/Device KIT   . calcium carbonate (TUMS - DOSED IN MG ELEMENTAL CALCIUM) 500 MG chewable tablet Chew 1 tablet by mouth daily as needed for indigestion or heartburn.  . fluticasone (FLONASE) 50 MCG/ACT nasal spray Place into both nostrils daily as needed for allergies or rhinitis.  . Fluticasone Propionate (FLONASE NA)   . furosemide (LASIX) 80 MG tablet Take 80 mg by mouth daily.  . Garlic 500 MG TABS Take 1 tablet by mouth daily.  . insulin lispro (HUMALOG) 100 UNIT/ML injection Use as directed daily at bedtime as directed per sliding scale.  . insulin NPH Human (HUMULIN N,NOVOLIN N) 100 UNIT/ML injection Inject 38 Units into the skin at bedtime.   Marland Kitchen ipratropium (ATROVENT) 0.03 % nasal spray Place into the nose.  . Iron Combinations (CHROMAGEN) capsule Take 1 capsule by mouth daily.  . isosorbide mononitrate (IMDUR) 60 MG 24 hr tablet Take 1 tablet (60 mg total) by mouth daily.  Marland Kitchen MAGNESIUM PO Take 1 tablet by mouth daily.  . metoprolol succinate (TOPROL-XL) 50 MG 24 hr tablet Take 1.5 tablets (75 mg total) by mouth daily. Take with or immediately following a meal.  . Multiple Vitamin (M.V.I.  ADULT IV) 1 tablet  . Multiple Vitamin (MULTIVITAMIN) tablet Take 1 tablet by mouth daily.  . niacin (NIASPAN) 500 MG CR tablet Take 500 mg by mouth at bedtime.  . niacin (SLO-NIACIN) 500 MG tablet 1 tablet with food  . nitroGLYCERIN (NITROSTAT) 0.4 MG SL tablet Place 1 tablet (0.4 mg total) under the tongue every 5 (five) minutes as needed for chest pain.  . Omega-3 Fatty Acids (FISH OIL) 1000 MG CAPS 1 capsule  . omeprazole (PRILOSEC) 20 MG capsule Take 20 mg by mouth daily.  . ONE TOUCH ULTRA TEST test strip Use as directed  . pantoprazole (PROTONIX) 40 MG  tablet Take 1 tablet (40 mg total) by mouth daily.  . potassium chloride (KLOR-CON) 20 MEQ packet Take 20 mEq by mouth daily.  . simethicone (MYLICON) 845 MG chewable tablet Chew 125 mg by mouth every 6 (six) hours as needed for flatulence.  . TRULICITY 3.64 WO/0.3OZ SOPN    No current facility-administered medications for this visit. (Other)      REVIEW OF SYSTEMS:    ALLERGIES Allergies  Allergen Reactions  . Penicillins Rash    PAST MEDICAL HISTORY Past Medical History:  Diagnosis Date  . CAD in native artery 08/28/2013   Heaviness in chest with exertion. Coronary angiography 2005 demonstrated severe distal LAD and diagonal disease, severe distal RCA and PL OM disease, and severe first obtuse marginal disease. No high-grade proximal coronary disease was present at the time.  The most recent myocardial perfusion study in 2014 was nonischemic/low risk    . Chronic renal insufficiency   . Colon polyps    adenomatous  . Diabetes mellitus   . Hyperlipemia   . Hypertension   . Laryngeal papillomatosis   . Prostate cancer ([place])    w/ mets to bladder   Past Surgical History:  Procedure Laterality Date  . BLADDER SURGERY    . CATARACT EXTRACTION Left 02/2020  . CIRCUMCISION  2011  . POLYPECTOMY     vocal cords  . PROSTATE SURGERY      FAMILY HISTORY Family History  Problem Relation Age of Onset  . Breast cancer Mother   . Diabetes Mother   . Heart failure Father   . Heart disease Father   . Pancreatic cancer Brother   . Colon cancer Neg Hx   . Esophageal cancer Neg Hx   . Liver disease Neg Hx     SOCIAL HISTORY Social History   Tobacco Use  . Smoking status: Former Research scientist (life sciences)  . Smokeless tobacco: Never Used  Vaping Use  . Vaping Use: Never used  Substance Use Topics  . Alcohol use: Yes    Comment: social  . Drug use: No         OPHTHALMIC EXAM:  Base Eye Exam    Visual Acuity (ETDRS)      Right Left   Dist cc 20/20 -1 20/50 +1   Dist ph [place]  20/25  -1       Tonometry (Tonopen, 11:01 AM)      Right Left   Pressure 10 9       Neuro/Psych    Oriented x3: Yes   Mood/Affect: Normal       Dilation    Left eye: 1.0% Mydriacyl, 2.5% Phenylephrine @ 11:00 AM        Slit Lamp and Fundus Exam    External Exam      Right Left   External Normal Normal  Slit Lamp Exam      Right Left   Lids/Lashes Normal Normal   Conjunctiva/Sclera White and quiet White and quiet   Cornea Clear Clear   Anterior Chamber Deep and quiet Deep and quiet   Iris Round and reactive Round and reactive   Lens 2+ Nuclear sclerosis Clear   Anterior Vitreous Normal Normal       Fundus Exam      Right Left   Posterior Vitreous  Apparent vitreomacular adhesion seen in a ringlike fashion elevation on the epiretinal membrane the macula   Disc not dilated per patient Thin rim   C/D Ratio  0.65   Macula  Epiretinal membrane minor, no CME, no macular thickening   Vessels  no DR   Periphery  Normal          IMAGING AND PROCEDURES  Imaging and Procedures for 02/26/21  OCT, Retina - OU - Both Eyes       Right Eye Quality was borderline. Scan locations included subfoveal. Central Foveal Thickness: 247. Progression has been stable. Findings include normal foveal contour.   Left Eye Quality was good. Scan locations included subfoveal. Central Foveal Thickness: 290. Progression has improved. Findings include abnormal foveal contour, cystoid macular edema.   Notes OS much improved now only on topical NSAIDs.  Much less diffuse macular edema but most importantly left eye Much less subfoveal photoreceptor layer collection of material                 ASSESSMENT/PLAN:  Cystoid macular edema, left eye CME improved overall while still using topical NSAID twice daily.  Diabetes mellitus without complication ([place]) No DR  Left epiretinal membrane Minor and no topographic distortion  Primary open angle glaucoma of left eye, severe  stage Intraocular pressure stable and controlled      ICD-10-CM   1. Cystoid macular edema, left eye  H35.352 OCT, Retina - OU - Both Eyes  2. Left epiretinal membrane  H35.372 OCT, Retina - OU - Both Eyes  3. Diabetes mellitus without complication ([place])  K44.0   4. Primary open angle glaucoma of left eye, severe stage  H40.1123     1.  CME left eye pseudophakia, now controlled and maintained only on topical NSAID twice daily.  We will employ long-term use of NSAID topically left eye twice daily to control the the CME and maintain excellent visual acuity in the left eye.  2.  Explained to the patient that his pinhole acuity that improves the left eye 20/25 may suggest the need for valuation of spectacle change or just simply enjoy the mini myopia.  Follow-up with Dr. Duanne Guess as scheduled  3.  Minor epiretinal membrane clinically detectable not topographically distorting observe OS  No detectable diabetic retinopathy OU  Ophthalmic Meds Ordered this visit:  No orders of the defined types were placed in this encounter.      Return in about 6 months (around 08/28/2021) for DILATE OU, OCT.  There are no Patient Instructions on file for this visit.   Explained the diagnoses, plan, and follow up with the patient and they expressed understanding.  Patient expressed understanding of the importance of proper follow up care.   Clent Demark Mckenzy Salazar M.D. Diseases & Surgery of the Retina and Vitreous [facility] [place] 02/26/21     Abbreviations: M myopia (nearsighted); A astigmatism; H hyperopia (farsighted); P presbyopia; Mrx spectacle prescription;  CTL contact lenses; OD right eye; OS left eye; OU both  eyes  XT exotropia; ET esotropia; PEK punctate epithelial keratitis; PEE punctate epithelial erosions; DES dry eye syndrome; MGD meibomian gland dysfunction; ATs artificial tears; PFAT's preservative free artificial tears; [place] [address] nuclear sclerotic cataract; PSC posterior  subcapsular cataract; ERM epi-retinal membrane; PVD posterior vitreous detachment; RD retinal detachment; DM diabetes mellitus; DR diabetic retinopathy; NPDR non-proliferative diabetic retinopathy; PDR proliferative diabetic retinopathy; CSME clinically significant macular edema; DME diabetic macular edema; dbh dot blot hemorrhages; CWS cotton wool spot; POAG primary open angle glaucoma; C/D cup-to-disc ratio; HVF humphrey visual field; GVF goldmann visual field; OCT optical coherence tomography; IOP intraocular pressure; BRVO Branch retinal vein occlusion; CRVO central retinal vein occlusion; CRAO central retinal artery occlusion; BRAO branch retinal artery occlusion; RT retinal tear; SB scleral buckle; PPV pars plana vitrectomy; VH Vitreous hemorrhage; PRP panretinal laser photocoagulation; IVK intravitreal kenalog; VMT vitreomacular traction; MH Macular hole;  NVD neovascularization of the disc; NVE neovascularization elsewhere; AREDS age related eye disease study; ARMD age related macular degeneration; POAG primary open angle glaucoma; EBMD epithelial/anterior basement membrane dystrophy; ACIOL anterior chamber intraocular lens; IOL intraocular lens; PCIOL posterior chamber intraocular lens; Phaco/IOL phacoemulsification with intraocular lens placement; [place] photorefractive keratectomy; LASIK laser assisted in situ keratomileusis; HTN hypertension; DM diabetes mellitus; COPD chronic obstructive pulmonary disease

## 2021-02-26 NOTE — Telephone Encounter (Signed)
Thanks

## 2021-02-26 NOTE — Assessment & Plan Note (Signed)
CME improved overall while still using topical NSAID twice daily.

## 2021-02-26 NOTE — Assessment & Plan Note (Signed)
Minor and no topographic distortion

## 2021-02-26 NOTE — Assessment & Plan Note (Signed)
No DR ?

## 2021-02-26 NOTE — Assessment & Plan Note (Signed)
Intraocular pressure stable and controlled

## 2021-03-10 ENCOUNTER — Ambulatory Visit (INDEPENDENT_AMBULATORY_CARE_PROVIDER_SITE_OTHER): Payer: Medicare HMO | Admitting: Podiatry

## 2021-03-10 ENCOUNTER — Other Ambulatory Visit: Payer: Self-pay

## 2021-03-10 ENCOUNTER — Encounter: Payer: Self-pay | Admitting: Podiatry

## 2021-03-10 DIAGNOSIS — E1151 Type 2 diabetes mellitus with diabetic peripheral angiopathy without gangrene: Secondary | ICD-10-CM | POA: Diagnosis not present

## 2021-03-10 DIAGNOSIS — Q828 Other specified congenital malformations of skin: Secondary | ICD-10-CM

## 2021-03-10 DIAGNOSIS — B351 Tinea unguium: Secondary | ICD-10-CM

## 2021-03-10 DIAGNOSIS — M79674 Pain in right toe(s): Secondary | ICD-10-CM | POA: Diagnosis not present

## 2021-03-10 DIAGNOSIS — M79675 Pain in left toe(s): Secondary | ICD-10-CM

## 2021-03-10 DIAGNOSIS — L600 Ingrowing nail: Secondary | ICD-10-CM | POA: Diagnosis not present

## 2021-03-10 NOTE — Patient Instructions (Signed)
EPSOM SALT FOOT SOAK INSTRUCTIONS  *IF YOU HAVE BEEN PRESCRIBED ANTIBIOTICS, TAKE AS INSTRUCTED UNTIL ALL ARE GONE*  Shopping List:  A. Plain epsom salt (not scented) B. Neosporin Plus Pain Relief Ointment C. 1-inch fabric band-aids   Place 1/4 cup of epsom salts in 2 quarts of warm tap water. IF YOU ARE DIABETIC, OR HAVE NEUROPATHY, CHECK THE TEMPERATURE OF THE WATER WITH YOUR ELBOW.   Submerge your foot/feet in the solution and soak for 10-15 minutes.      3.  Next, remove your foot/feet from solution, blot dry the affected area.    4.  Apply light amount of antibiotic cream/ointment and cover with fabric band-aid .  5.  This soak should be done once a day for 3 days.   6.  Monitor for any signs/symptoms of infection such as redness, swelling, odor, drainage, increased pain, or non-healing of digit.   7.  Please do not hesitate to call the office and speak to a Nurse or Doctor if you have questions.   8.  If you experience fever, chills, nightsweats, nausea or vomiting with worsening of digit, please go to the emergency room.

## 2021-03-13 DIAGNOSIS — E7800 Pure hypercholesterolemia, unspecified: Secondary | ICD-10-CM | POA: Diagnosis not present

## 2021-03-13 DIAGNOSIS — E1122 Type 2 diabetes mellitus with diabetic chronic kidney disease: Secondary | ICD-10-CM | POA: Diagnosis not present

## 2021-03-13 DIAGNOSIS — N183 Chronic kidney disease, stage 3 unspecified: Secondary | ICD-10-CM | POA: Diagnosis not present

## 2021-03-13 DIAGNOSIS — I130 Hypertensive heart and chronic kidney disease with heart failure and stage 1 through stage 4 chronic kidney disease, or unspecified chronic kidney disease: Secondary | ICD-10-CM | POA: Diagnosis not present

## 2021-03-13 DIAGNOSIS — I5030 Unspecified diastolic (congestive) heart failure: Secondary | ICD-10-CM | POA: Diagnosis not present

## 2021-03-13 DIAGNOSIS — E785 Hyperlipidemia, unspecified: Secondary | ICD-10-CM | POA: Diagnosis not present

## 2021-03-13 DIAGNOSIS — D649 Anemia, unspecified: Secondary | ICD-10-CM | POA: Diagnosis not present

## 2021-03-13 DIAGNOSIS — I2510 Atherosclerotic heart disease of native coronary artery without angina pectoris: Secondary | ICD-10-CM | POA: Diagnosis not present

## 2021-03-13 DIAGNOSIS — E1165 Type 2 diabetes mellitus with hyperglycemia: Secondary | ICD-10-CM | POA: Diagnosis not present

## 2021-03-14 NOTE — Progress Notes (Signed)
  Subjective:  Patient ID: Aaron Carlson, male    DOB: Sep 06, 1933,  MRN: [id number]  87 y.o. male presents with at risk foot care. Pt has h/o NIDDM with PAD and painful porokeratotic lesion(s) b/l feet and painful mycotic toenails that limit ambulation. Painful toenails interfere with ambulation. Aggravating factors include wearing enclosed shoe gear. Pain is relieved with periodic professional debridement. Painful porokeratotic lesions are aggravated when weightbearing with and without shoegear. Pain is relieved with periodic professional debridement..    Patient's blood sugar was 250 mg/dl today.  PCP: Antony Contras, MD and last visit was: 10/22/2020.  Patient states his left great toe is sore today. Denies any redness, drainage or swelling, but it is painful when wearing enclosed shoe gear. He denies any attempt at treatment.  Review of Systems: Negative except as noted in the HPI.   Allergies  Allergen Reactions   Penicillins Rash    Objective:  There were no vitals filed for this visit. Constitutional Patient is a pleasant 87 y.o. African American male WD, WN in NAD. AAO x 3.  Vascular Capillary refill time to digits immediate b/l. Faintly palpable DP pulse(s) b/l lower extremities. Nonpalpable PT pulse(s) b/l lower extremities. Pedal hair absent. Lower extremity skin temperature gradient within normal limits. No pain with calf compression b/l. No edema noted b/l lower extremities. No cyanosis or clubbing noted.  Neurologic Normal speech. Protective sensation intact 5/5 intact bilaterally with 10g monofilament b/l. Vibratory sensation intact b/l.  Dermatologic Pedal skin with normal turgor, texture and tone bilaterally. No open wounds bilaterally. No interdigital macerations bilaterally. Toenails 1-5 b/l elongated, discolored, dystrophic, thickened, crumbly with subungual debris and tenderness to dorsal palpation. Incurvated nailplate medial border(s) L hallux.  Nail border hypertrophy  absent. There is tenderness to palpation. Sign(s) of infection: no clinical signs of infection noted on examination today.. Porokeratotic lesion(s) submet head 2 left foot and submet head 2 right foot. No erythema, no edema, no drainage, no fluctuance.  Orthopedic: Normal muscle strength 5/5 to all lower extremity muscle groups bilaterally. No pain crepitus or joint limitation noted with ROM b/l. Hammertoe(s) noted to the L 2nd toe and R 2nd toe.   No flowsheet data found.     Assessment:   1. Pain due to onychomycosis of toenails of both feet   2. Porokeratosis   3. Ingrown toenail without infection   4. Type II diabetes mellitus with peripheral circulatory disorder [facility])    Plan:  Patient was evaluated and treated and all questions answered.  Onychomycosis with pain -Nails palliatively debridement as below. -Educated on self-care  Procedure: Nail Debridement Rationale: Pain Type of Debridement: manual, sharp debridement. Instrumentation: Nail nipper, rotary burr. Number of Nails: 10  -Examined patient. -Patient to continue soft, supportive shoe gear daily. -Toenails 1-5 b/l were debrided in length and girth with sterile nail nippers and dremel without iatrogenic bleeding.  -Offending nail border debrided and curretaged L hallux utilizing sterile nail nipper and currette. Border(s) cleansed with alcohol and triple antibiotic ointment applied. Dispensed written instructions for once daily epsom salt soaks for 3 days. -Painful porokeratotic lesion(s) submet head 2 left foot and submet head 2 right foot pared and enucleated with sterile scalpel blade without incident. Total number of lesions debrided=2. -Patient to report any pedal injuries to medical professional immediately. -Patient/POA to call should there be question/concern in the interim.  Return in about 9 weeks (around 05/12/2021).  Marzetta Board, DPM

## 2021-03-24 ENCOUNTER — Telehealth: Payer: Self-pay | Admitting: Internal Medicine

## 2021-03-24 NOTE — Telephone Encounter (Signed)
Patient wife calling to states patient is experiencing chronic diarrhea.. Patient eats and has bowel movement not long after eating..Plz advise  thanks

## 2021-03-24 NOTE — Telephone Encounter (Signed)
Spoke with patient's wife. Pt's wife reports she feels patient has had constant worsening diarrhea . Wife states he has had issues with diarrhea she has noticed since January. Wife States after every meal patient has to go to the restroom. States she feels he "lives in the bathroom." Wife reports they have tried all OTC meds they can with no improvement. Appetite has decreased but patient able to hold down liquids. Wife also states patient is loosing weight and has gone from size 44 to 42 noted around Fathers Day. Wife states stool is an "orange-ish" color which is not new and no change in smell. However states urine has an odor to it. Reports patient lays around all day and has no energy. Requesting recommendations. Please Advise, thank you.

## 2021-03-24 NOTE — Telephone Encounter (Signed)
Have him submit stool studies for enteric pathogens.  Also, he may take over-the-counter Imodium as directed.  Set him up for an office appointment with one of the advanced practitioners or myself.  Thanks

## 2021-03-25 NOTE — Telephone Encounter (Signed)
Spoke with patient. Advised patient of rec's per MD, however patient states he feels "all better" and feels he does not need to come in for stool sample or OV. Patient states he went to sleep and woke up this morning and symptoms were gone. Pt advised to contact office if symptoms return or anything else is needed. Nothing further at this time.

## 2021-03-26 DIAGNOSIS — H25811 Combined forms of age-related cataract, right eye: Secondary | ICD-10-CM | POA: Diagnosis not present

## 2021-03-26 DIAGNOSIS — H401131 Primary open-angle glaucoma, bilateral, mild stage: Secondary | ICD-10-CM | POA: Diagnosis not present

## 2021-03-26 DIAGNOSIS — H35372 Puckering of macula, left eye: Secondary | ICD-10-CM | POA: Diagnosis not present

## 2021-03-26 DIAGNOSIS — H35352 Cystoid macular degeneration, left eye: Secondary | ICD-10-CM | POA: Diagnosis not present

## 2021-03-26 DIAGNOSIS — Z961 Presence of intraocular lens: Secondary | ICD-10-CM | POA: Diagnosis not present

## 2021-03-26 DIAGNOSIS — E119 Type 2 diabetes mellitus without complications: Secondary | ICD-10-CM | POA: Diagnosis not present

## 2021-03-26 DIAGNOSIS — H43822 Vitreomacular adhesion, left eye: Secondary | ICD-10-CM | POA: Diagnosis not present

## 2021-04-01 ENCOUNTER — Other Ambulatory Visit: Payer: Self-pay

## 2021-04-01 ENCOUNTER — Other Ambulatory Visit: Payer: Medicare HMO

## 2021-04-01 DIAGNOSIS — R197 Diarrhea, unspecified: Secondary | ICD-10-CM

## 2021-04-01 DIAGNOSIS — R1013 Epigastric pain: Secondary | ICD-10-CM

## 2021-04-02 DIAGNOSIS — R197 Diarrhea, unspecified: Secondary | ICD-10-CM | POA: Diagnosis not present

## 2021-04-03 LAB — GI PROFILE, STOOL, PCR

## 2021-04-04 LAB — CLOSTRIDIUM DIFFICILE BY PCR: Toxigenic C. Difficile by PCR: NEGATIVE

## 2021-04-07 NOTE — Progress Notes (Signed)
Spoke with pt and he is aware of Dr. Perry's recommendations. 

## 2021-04-11 ENCOUNTER — Telehealth: Payer: Self-pay | Admitting: Internal Medicine

## 2021-04-11 NOTE — Telephone Encounter (Signed)
Spoke to patient and clarified that based on his office visit in May, he should still be taking Ib Gard over the counter, daily Pantoprazole, and over the counter Gaviscon at night as needed.  Patient verbalized understanding.  I told him to call back if he had any more questions.  Patient agreed.

## 2021-04-11 NOTE — Telephone Encounter (Signed)
Inbound call from pt requesting a call back stating that he is confused on which medications he's suppose to be taking at this time that Dr. Henrene Pastor has prescribed to him. Please advise. Thanks.

## 2021-04-22 DIAGNOSIS — R634 Abnormal weight loss: Secondary | ICD-10-CM | POA: Diagnosis not present

## 2021-04-22 DIAGNOSIS — I5030 Unspecified diastolic (congestive) heart failure: Secondary | ICD-10-CM | POA: Diagnosis not present

## 2021-04-22 DIAGNOSIS — N1832 Chronic kidney disease, stage 3b: Secondary | ICD-10-CM | POA: Diagnosis not present

## 2021-04-22 DIAGNOSIS — E7800 Pure hypercholesterolemia, unspecified: Secondary | ICD-10-CM | POA: Diagnosis not present

## 2021-04-22 DIAGNOSIS — K219 Gastro-esophageal reflux disease without esophagitis: Secondary | ICD-10-CM | POA: Diagnosis not present

## 2021-04-22 DIAGNOSIS — E1129 Type 2 diabetes mellitus with other diabetic kidney complication: Secondary | ICD-10-CM | POA: Diagnosis not present

## 2021-04-22 DIAGNOSIS — D649 Anemia, unspecified: Secondary | ICD-10-CM | POA: Diagnosis not present

## 2021-04-22 DIAGNOSIS — I130 Hypertensive heart and chronic kidney disease with heart failure and stage 1 through stage 4 chronic kidney disease, or unspecified chronic kidney disease: Secondary | ICD-10-CM | POA: Diagnosis not present

## 2021-04-22 DIAGNOSIS — Z8546 Personal history of malignant neoplasm of prostate: Secondary | ICD-10-CM | POA: Diagnosis not present

## 2021-05-07 ENCOUNTER — Telehealth: Payer: Self-pay | Admitting: Internal Medicine

## 2021-05-07 NOTE — Telephone Encounter (Signed)
Phone called returned. Message left with family member for pt to call back.   Spoke with pt and he had not been taking the gaviscon. Reviewed instructions below with pt and he will resume these.  Spoke to patient and clarified that based on his office visit in May, he should still be taking Ib Gard over the counter, daily Pantoprazole, and over the counter Gaviscon at night as needed.  Patient verbalized understanding.  I told him to call back if he had any more questions.  Patient agreed.

## 2021-05-07 NOTE — Telephone Encounter (Signed)
Patient called states he is having heartburn and a lot of gas seeking advise.

## 2021-05-12 ENCOUNTER — Ambulatory Visit (INDEPENDENT_AMBULATORY_CARE_PROVIDER_SITE_OTHER): Payer: Medicare HMO | Admitting: Podiatry

## 2021-05-12 ENCOUNTER — Encounter: Payer: Self-pay | Admitting: Podiatry

## 2021-05-12 ENCOUNTER — Other Ambulatory Visit: Payer: Self-pay

## 2021-05-12 DIAGNOSIS — E1151 Type 2 diabetes mellitus with diabetic peripheral angiopathy without gangrene: Secondary | ICD-10-CM

## 2021-05-12 DIAGNOSIS — B351 Tinea unguium: Secondary | ICD-10-CM

## 2021-05-12 DIAGNOSIS — Q828 Other specified congenital malformations of skin: Secondary | ICD-10-CM | POA: Diagnosis not present

## 2021-05-12 DIAGNOSIS — M79674 Pain in right toe(s): Secondary | ICD-10-CM

## 2021-05-12 DIAGNOSIS — M79675 Pain in left toe(s): Secondary | ICD-10-CM

## 2021-05-16 NOTE — Progress Notes (Signed)
Subjective: Aaron Carlson is a pleasant 87 y.o. male patient seen today for at risk diabetic foot care. He has h/o DM with PAD. Patient is seen for bilateral painful porokeratotic lesions of both feet and painful thick toenails that are difficult to trim. Pain interferes with ambulation. Aggravating factors include wearing enclosed shoe gear. Pain is relieved with periodic professional debridement.  PCP is Antony Contras, MD. Last visit was: 12/26/2020.  Allergies  Allergen Reactions   Penicillins Rash    Objective: Physical Exam  General: Aaron Carlson is a pleasant 87 y.o. African American male, WD, WN in NAD. AAO x 3.   Vascular:  Capillary fill time to digits <3 seconds b/l lower extremities. Faintly palpable DP pulse(s) b/l lower extremities. Nonpalpable PT pulse(s) b/l lower extremities. Pedal hair absent. Lower extremity skin temperature gradient within normal limits. No pain with calf compression b/l. No edema noted b/l lower extremities.  Dermatological:  Pedal skin with normal turgor, texture and tone b/l lower extremities. No open wounds b/l lower extremities. No interdigital macerations b/l lower extremities. Toenails 1-5 b/l elongated, discolored, dystrophic, thickened, crumbly with subungual debris and tenderness to dorsal palpation. Porokeratotic lesion(s) submet head 2 left foot and submet head 2 right foot. No erythema, no edema, no drainage, no fluctuance.  Musculoskeletal:  Normal muscle strength 5/5 to all lower extremity muscle groups bilaterally. Hammertoe(s) noted to the L 2nd toe and R 2nd toe.  Neurological:  Protective sensation intact 5/5 intact bilaterally with 10g monofilament b/l. Vibratory sensation intact b/l.  Assessment and Plan:  1. Pain due to onychomycosis of toenails of both feet   2. Porokeratosis   3. Type II diabetes mellitus with peripheral circulatory disorder (HCC)      -Examined patient. -Continue diabetic foot care principles: inspect  feet daily, monitor glucose as recommended by PCP and/or Endocrinologist, and follow prescribed diet per PCP, Endocrinologist and/or dietician. -Patient to continue soft, supportive shoe gear daily. -Toenails 1-5 b/l were debrided in length and girth with sterile nail nippers and dremel without iatrogenic bleeding.  -Painful porokeratotic lesion(s) submet head 2 left foot and submet head 2 right foot pared and enucleated with sterile scalpel blade without incident. Total number of lesions debrided=2. -Patient to report any pedal injuries to medical professional immediately. -Patient/POA to call should there be question/concern in the interim.  Return in about 3 months (around 08/12/2021).  Marzetta Board, DPM

## 2021-05-26 DIAGNOSIS — I2510 Atherosclerotic heart disease of native coronary artery without angina pectoris: Secondary | ICD-10-CM | POA: Diagnosis not present

## 2021-05-26 DIAGNOSIS — E785 Hyperlipidemia, unspecified: Secondary | ICD-10-CM | POA: Diagnosis not present

## 2021-05-26 DIAGNOSIS — D649 Anemia, unspecified: Secondary | ICD-10-CM | POA: Diagnosis not present

## 2021-05-26 DIAGNOSIS — E7800 Pure hypercholesterolemia, unspecified: Secondary | ICD-10-CM | POA: Diagnosis not present

## 2021-05-26 DIAGNOSIS — I130 Hypertensive heart and chronic kidney disease with heart failure and stage 1 through stage 4 chronic kidney disease, or unspecified chronic kidney disease: Secondary | ICD-10-CM | POA: Diagnosis not present

## 2021-05-26 DIAGNOSIS — E1165 Type 2 diabetes mellitus with hyperglycemia: Secondary | ICD-10-CM | POA: Diagnosis not present

## 2021-05-26 DIAGNOSIS — E1122 Type 2 diabetes mellitus with diabetic chronic kidney disease: Secondary | ICD-10-CM | POA: Diagnosis not present

## 2021-05-26 DIAGNOSIS — I110 Hypertensive heart disease with heart failure: Secondary | ICD-10-CM | POA: Diagnosis not present

## 2021-05-26 DIAGNOSIS — I5030 Unspecified diastolic (congestive) heart failure: Secondary | ICD-10-CM | POA: Diagnosis not present

## 2021-06-10 NOTE — Progress Notes (Signed)
Cardiology Office Note:    Date:  06/11/2021   ID:  Aaron Carlson, DOB November 10, 1932, MRN [id number]  PCP:  Antony Contras, MD  Cardiologist:  Sinclair Grooms, MD   Referring MD: Antony Contras, MD   Chief Complaint  Patient presents with   Congestive Heart Failure   Coronary Artery Disease   Follow-up    Heartburn    History of Present Illness:    Aaron Carlson is a 87 y.o. male with a hx of CAD, documented diffuse vessel disease of diabetic variety by angiography, chronic combined systolic and diastolic heart failure, essential hypertension, chronic kidney disease stage 3-4, and elderly and frail.   Aaron Carlson seems doing relatively well and over time feels that he is better now than he was when seen 6 months ago.  Still being bothered by "indigestion".  Feels better when he takes antiacids.  The story troubles me because postprandially, he will develop the "heartburn" if he gets up and walks around.  If he moves around prior to eating there is no such symptoms.  This symptom is not made worse by lying down.  He has not needed sublingual nitroglycerin but has taken sublingual nitroglycerin for the "indigestion" without relief.  Past Medical History:  Diagnosis Date   CAD in native artery 08/28/2013   Heaviness in chest with exertion. Coronary angiography 2005 demonstrated severe distal LAD and diagonal disease, severe distal RCA and PL OM disease, and severe first obtuse marginal disease. No high-grade proximal coronary disease was present at the time.  The most recent myocardial perfusion study in 2014 was nonischemic/low risk     Chronic renal insufficiency    Colon polyps    adenomatous   Diabetes mellitus    Hyperlipemia    Hypertension    Laryngeal papillomatosis    Prostate cancer (Aaron Carlson)    w/ mets to bladder    Past Surgical History:  Procedure Laterality Date   BLADDER SURGERY     CATARACT EXTRACTION Left 02/2020   CIRCUMCISION  2011   POLYPECTOMY     vocal cords    PROSTATE SURGERY      Current Medications: Current Meds  Medication Sig   Accu-Chek Softclix Lancets lancets    Alcohol Swabs 70 % PADS use when checking blood sugar   atorvastatin (LIPITOR) 20 MG tablet 1 tablet   BD INSULIN SYRINGE ULTRAFINE 31G X 5/16" 0.5 ML MISC Use as directed   benazepril (LOTENSIN) 20 MG tablet 1 tablet   bismuth subsalicylate (PEPTO BISMOL) 262 MG/15ML suspension Take 30 mLs by mouth every 6 (six) hours as needed.   Blood Glucose Monitoring Suppl (TRUE METRIX METER) w/Device KIT    calcium carbonate (TUMS - DOSED IN MG ELEMENTAL CALCIUM) 500 MG chewable tablet Chew 1 tablet by mouth daily as needed for indigestion or heartburn.   fluorometholone (FML) 0.1 % ophthalmic suspension 1 drop into affected eye   fluorometholone (FML) 0.1 % ophthalmic suspension    fluticasone (FLONASE) 50 MCG/ACT nasal spray Place into both nostrils daily as needed for allergies or rhinitis.   Fluticasone Propionate (FLONASE NA)    furosemide (LASIX) 80 MG tablet Take 80 mg by mouth daily.   Garlic 628 MG TABS Take 1 tablet by mouth daily.   insulin lispro (HUMALOG) 100 UNIT/ML injection Use as directed daily at bedtime as directed per sliding scale.   insulin NPH Human (HUMULIN N,NOVOLIN N) 100 UNIT/ML injection Inject 38 Units into the skin at bedtime.  ipratropium (ATROVENT) 0.03 % nasal spray Place into the nose.   Iron Combinations (CHROMAGEN) capsule Take 1 capsule by mouth daily.   ketorolac (ACULAR) 0.4 % SOLN INSTILL 1 DROP INTO LEFT EYE FOUR TIMES DAILY   latanoprost (XALATAN) 0.005 % ophthalmic solution Place 1 drop into both eyes at bedtime.   Latanoprostene Bunod (VYZULTA) 0.024 % SOLN 1 drop into affected eye in the evening   MAGNESIUM PO Take 1 tablet by mouth daily.   metoprolol succinate (TOPROL XL) 25 MG 24 hr tablet Take 1 tablet (25 mg total) by mouth daily.   Multiple Vitamin (M.V.I. ADULT IV) 1 tablet   Multiple Vitamin (MULTIVITAMIN) tablet Take 1 tablet by  mouth daily.   niacin (NIASPAN) 500 MG CR tablet Take 500 mg by mouth at bedtime.   niacin (SLO-NIACIN) 500 MG tablet 1 tablet with food   Omega-3 Fatty Acids (FISH OIL) 1000 MG CAPS 1 capsule   omeprazole (PRILOSEC) 20 MG capsule Take 20 mg by mouth daily.   ONE TOUCH ULTRA TEST test strip Use as directed   pantoprazole (PROTONIX) 40 MG tablet Take 1 tablet (40 mg total) by mouth daily.   potassium chloride (KLOR-CON) 20 MEQ packet Take 20 mEq by mouth daily.   simethicone (MYLICON) 716 MG chewable tablet Chew 125 mg by mouth every 6 (six) hours as needed for flatulence.   TRULICITY 9.67 EL/3.8BO SOPN    White Petrolatum-Mineral Oil (ARTIFICIAL TEARS) ointment Place into the left eye in the morning, at noon, and at bedtime.   [DISCONTINUED] metoprolol succinate (TOPROL-XL) 50 MG 24 hr tablet Take 1.5 tablets (75 mg total) by mouth daily. Take with or immediately following a meal. (Patient taking differently: Take 50 mg by mouth daily. Pt. Sometimes take $RemoveBefore'25mg'[id number]$  or 1/2 tablet depending on how his bloodpressure is running)     Allergies:   Penicillins   Social History   Socioeconomic History   Marital status: Married    Spouse name: Not on file   Number of children: 4   Years of education: Not on file   Highest education level: Not on file  Occupational History   Occupation: Retired  Tobacco Use   Smoking status: Former   Smokeless tobacco: Never  Scientific laboratory technician Use: Never used  Substance and Sexual Activity   Alcohol use: Yes    Comment: social   Drug use: No   Sexual activity: Not on file  Other Topics Concern   Not on file  Social History Narrative   Not on file   Social Determinants of Health   Financial Resource Strain: Not on file  Food Insecurity: Not on file  Transportation Needs: Not on file  Physical Activity: Not on file  Stress: Not on file  Social Connections: Not on file     Family History: The patient's family history includes Breast cancer in his  mother; Diabetes in his mother; Heart disease in his father; Heart failure in his father; Pancreatic cancer in his brother. There is no history of Colon cancer, Esophageal cancer, or Liver disease.  ROS:   Please see the history of present illness.    He has not taken the blood pressure medicines as listed.  We had increased metoprolol succinate to 75 mg/day.  He states that if he takes that dose he feels weak and lightheaded at and has resorted to taking 25 mg if blood pressures are low or 50 mg if blood pressure is relatively high.  All  other systems reviewed and are negative.  EKGs/Labs/Other Studies Reviewed:    The following studies were reviewed today:  ECHOCARDIOGRAM 11/2020 IMPRESSIONS     1. Left ventricular ejection fraction, by estimation, is 50%. The left  ventricle has low normal function. The left ventricle demonstrates  regional wall motion abnormalities in the apex. There is mild concentric  left ventricular hypertrophy. Left  ventricular diastolic parameters are consistent with Grade I diastolic  dysfunction (impaired relaxation). The average left ventricular global  longitudinal strain is -12.5 %. The global longitudinal strain is  abnormal, potentially suggesting further  decrease in wall motion that is missed secondary to lack of echo-contrast.  Consider further imaging or additional studies if clinically indicated.   2. Right ventricular systolic function is normal. The right ventricular  size is normal.   3. The mitral valve is normal in structure. No evidence of mitral valve  regurgitation.   4. The aortic valve is tricuspid. There is mild calcification of the  aortic valve. There is mild thickening of the aortic valve. Aortic valve  regurgitation is not visualized. Mild aortic valve sclerosis is present,  with no evidence of aortic valve  stenosis.   5. The inferior vena cava is normal in size with greater than 50%  respiratory variability, suggesting right  atrial pressure of 3 mmHg.   Comparison(s): No prior Echocardiogram.    EKG:  EKG not performed  Recent Labs: 12/06/2020: Creatinine, Ser 2.30  Recent Lipid Panel    Component Value Date/Time   CHOL 119 08/16/2006 0816   TRIG 101 08/16/2006 0816   HDL 42.4 08/16/2006 0816   CHOLHDL 2.8 CALC 08/16/2006 0816   VLDL 20 08/16/2006 0816   LDLCALC 56 08/16/2006 0816    Physical Exam:    VS:  BP 122/68   Pulse 73   Ht $R'5\' 8"'on$  (1.727 m)   Wt 207 lb (93.9 kg)   SpO2 99%   BMI 31.47 kg/m     Wt Readings from Last 3 Encounters:  06/11/21 207 lb (93.9 kg)  01/29/21 210 lb 6.4 oz (95.4 kg)  12/09/20 210 lb (95.3 kg)     GEN: Abdominal obesity yet he appears younger than stated age.. No acute distress HEENT: Normal NECK: No JVD. LYMPHATICS: No lymphadenopathy CARDIAC: No murmur. RRR there is an S4 but no gallop, or edema. VASCULAR:  Normal Pulses. No bruits. RESPIRATORY:  Clear to auscultation without rales, wheezing or rhonchi  ABDOMEN: Soft, non-tender, non-distended, No pulsatile mass, MUSCULOSKELETAL: No deformity  SKIN: Warm and dry NEUROLOGIC:  Alert and oriented x 3 PSYCHIATRIC:  Normal affect   ASSESSMENT:    1. Coronary artery disease involving native coronary artery of native heart with angina pectoris ([place])   2. Essential hypertension   3. Chronic diastolic heart failure ([place])   4. Mixed hyperlipidemia   5. Stage 3b chronic kidney disease ([place])   6. RBBB (right bundle branch block with left anterior fascicular block)    PLAN:    In order of problems listed above:  Secondary prevention reviewed.  Physical activity encouraged. Decided to decrease Toprol-XL to 25 mg/day.  Monitor blood pressure at home and if consistently greater than 155/90 increase to 50 mg/day.  He has been alternating 25 mg and 50 mg depending upon his blood pressure. No evidence of volume overload on exam Continue niacin, atorvastatin 20 mg, and low-fat diet. CKD with creatinine of 2.2 in  July. EKG was not performed today.   Medication Adjustments/Labs and  Tests Ordered: Current medicines are reviewed at length with the patient today.  Concerns regarding medicines are outlined above.  No orders of the defined types were placed in this encounter.  Meds ordered this encounter  Medications   metoprolol succinate (TOPROL XL) 25 MG 24 hr tablet    Sig: Take 1 tablet (25 mg total) by mouth daily.    Dispense:  90 tablet    Refill:  3    Order Specific Question:   Supervising Provider    Answer:   Thayer Headings [phone]    Patient Instructions  Medication Instructions:  Your physician has recommended you make the following change in your medication:  DECREASE your Metoprolol succinate (Toprol XL) to 25 mg once daily   *If you need a refill on your cardiac medications before your next appointment, please call your pharmacy*   Lab Work: None Ordered If you have labs (blood work) drawn today and your tests are completely normal, you will receive your results only by: [place] (if you have MyChart) OR A paper copy in the mail If you have any lab test that is abnormal or we need to change your treatment, we will call you to review the results.   Testing/Procedures: None Ordered   Follow-Up: At [facility], you and your health needs are our priority.  As part of our continuing mission to provide you with exceptional heart care, we have created designated Provider Care Teams.  These Care Teams include your primary Cardiologist (physician) and Advanced Practice Providers (APPs -  Physician Assistants and Nurse Practitioners) who all work together to provide you with the care you need, when you need it.  We recommend signing up for the patient portal called "MyChart".  Sign up information is provided on this After Visit Summary.  MyChart is used to connect with patients for Virtual Visits (Telemedicine).  Patients are able to view lab/test results, encounter notes,  upcoming appointments, etc.  Non-urgent messages can be sent to your provider as well.   To learn more about what you can do with MyChart, go to [email or web address].    Your next appointment:   9 month(s)  The format for your next appointment:   In Person  Provider:   You may see Sinclair Grooms, MD or one of the following Advanced Practice Providers on your designated Care Team:   Cecilie Kicks, NP    Other Instructions:  Monitor blood pressure - call back if BP is consistently higher than 155/9    Signed, Sinclair Grooms, MD  06/11/2021 11:23 AM    [place]

## 2021-06-11 ENCOUNTER — Ambulatory Visit: Payer: Medicare HMO | Admitting: Interventional Cardiology

## 2021-06-11 ENCOUNTER — Other Ambulatory Visit: Payer: Self-pay

## 2021-06-11 ENCOUNTER — Encounter: Payer: Self-pay | Admitting: Interventional Cardiology

## 2021-06-11 VITALS — BP 122/68 | HR 73 | Ht 68.0 in | Wt 207.0 lb

## 2021-06-11 DIAGNOSIS — I5032 Chronic diastolic (congestive) heart failure: Secondary | ICD-10-CM

## 2021-06-11 DIAGNOSIS — I452 Bifascicular block: Secondary | ICD-10-CM

## 2021-06-11 DIAGNOSIS — I25119 Atherosclerotic heart disease of native coronary artery with unspecified angina pectoris: Secondary | ICD-10-CM

## 2021-06-11 DIAGNOSIS — I10 Essential (primary) hypertension: Secondary | ICD-10-CM

## 2021-06-11 DIAGNOSIS — E782 Mixed hyperlipidemia: Secondary | ICD-10-CM | POA: Diagnosis not present

## 2021-06-11 DIAGNOSIS — N1832 Chronic kidney disease, stage 3b: Secondary | ICD-10-CM

## 2021-06-11 MED ORDER — METOPROLOL SUCCINATE ER 25 MG PO TB24: 25.0000 mg | ORAL_TABLET | Freq: Every day | ORAL | 3 refills | Status: AC

## 2021-06-11 NOTE — Patient Instructions (Signed)
Medication Instructions:  Your physician has recommended you make the following change in your medication:  DECREASE your Metoprolol succinate (Toprol XL) to 25 mg once daily   *If you need a refill on your cardiac medications before your next appointment, please call your pharmacy*   Lab Work: None Ordered If you have labs (blood work) drawn today and your tests are completely normal, you will receive your results only by: [place] (if you have MyChart) OR A paper copy in the mail If you have any lab test that is abnormal or we need to change your treatment, we will call you to review the results.   Testing/Procedures: None Ordered   Follow-Up: At [facility], you and your health needs are our priority.  As part of our continuing mission to provide you with exceptional heart care, we have created designated Provider Care Teams.  These Care Teams include your primary Cardiologist (physician) and Advanced Practice Providers (APPs -  Physician Assistants and Nurse Practitioners) who all work together to provide you with the care you need, when you need it.  We recommend signing up for the patient portal called "MyChart".  Sign up information is provided on this After Visit Summary.  MyChart is used to connect with patients for Virtual Visits (Telemedicine).  Patients are able to view lab/test results, encounter notes, upcoming appointments, etc.  Non-urgent messages can be sent to your provider as well.   To learn more about what you can do with MyChart, go to [email or web address].    Your next appointment:   9 month(s)  The format for your next appointment:   In Person  Provider:   You may see Sinclair Grooms, MD or one of the following Advanced Practice Providers on your designated Care Team:   Cecilie Kicks, NP    Other Instructions:  Monitor blood pressure - call back if BP is consistently higher than 155/9

## 2021-06-13 DIAGNOSIS — N189 Chronic kidney disease, unspecified: Secondary | ICD-10-CM | POA: Diagnosis not present

## 2021-06-13 DIAGNOSIS — N2581 Secondary hyperparathyroidism of renal origin: Secondary | ICD-10-CM | POA: Diagnosis not present

## 2021-06-13 DIAGNOSIS — K30 Functional dyspepsia: Secondary | ICD-10-CM | POA: Diagnosis not present

## 2021-06-13 DIAGNOSIS — D631 Anemia in chronic kidney disease: Secondary | ICD-10-CM | POA: Diagnosis not present

## 2021-06-13 DIAGNOSIS — N1832 Chronic kidney disease, stage 3b: Secondary | ICD-10-CM | POA: Diagnosis not present

## 2021-06-13 DIAGNOSIS — E1122 Type 2 diabetes mellitus with diabetic chronic kidney disease: Secondary | ICD-10-CM | POA: Diagnosis not present

## 2021-06-13 DIAGNOSIS — N390 Urinary tract infection, site not specified: Secondary | ICD-10-CM | POA: Diagnosis not present

## 2021-06-13 DIAGNOSIS — C61 Malignant neoplasm of prostate: Secondary | ICD-10-CM | POA: Diagnosis not present

## 2021-06-13 DIAGNOSIS — D472 Monoclonal gammopathy: Secondary | ICD-10-CM | POA: Diagnosis not present

## 2021-06-19 ENCOUNTER — Encounter: Payer: Self-pay | Admitting: Internal Medicine

## 2021-06-19 ENCOUNTER — Ambulatory Visit: Payer: Medicare HMO | Admitting: Internal Medicine

## 2021-06-19 VITALS — BP 126/54 | HR 62 | Ht 68.0 in | Wt 206.0 lb

## 2021-06-19 DIAGNOSIS — R140 Abdominal distension (gaseous): Secondary | ICD-10-CM

## 2021-06-19 DIAGNOSIS — R1013 Epigastric pain: Secondary | ICD-10-CM

## 2021-06-19 NOTE — Patient Instructions (Signed)
Follow up with Dr. Henrene Pastor in 6 months.

## 2021-06-19 NOTE — Progress Notes (Signed)
HISTORY OF PRESENT ILLNESS:  Aaron Carlson is a 87 y.o. male, retired Company secretary, with chronic functional dyspepsia associated with bloating and belching.  Also GERD and intermittent loose stools.  He was last seen in the office regarding these issues Jan 29, 2021.  See that dictation.  He was instructed to continue IBgard, provided reassurance with regards to his work-up, had reflux precautions reviewed, continued pantoprazole daily and Gaviscon at night as needed.  Told to follow-up in 6 months.  Follows up at this time after contacting the office with complaints of diarrhea.  Multiple stool studies were negative or normal.  Overall he is doing much better.  States that his reflux is under better control.  Less abdominal distress with IBgard.  No new problems.  REVIEW OF SYSTEMS:  All non-GI ROS negative unless otherwise stated in the HPI.  Past Medical History:  Diagnosis Date   CAD in native artery 08/28/2013   Heaviness in chest with exertion. Coronary angiography 2005 demonstrated severe distal LAD and diagonal disease, severe distal RCA and PL OM disease, and severe first obtuse marginal disease. No high-grade proximal coronary disease was present at the time.  The most recent myocardial perfusion study in 2014 was nonischemic/low risk     Chronic renal insufficiency    Colon polyps    adenomatous   Diabetes mellitus    Hyperlipemia    Hypertension    Laryngeal papillomatosis    Prostate cancer ([place])    w/ mets to bladder    Past Surgical History:  Procedure Laterality Date   BLADDER SURGERY     CATARACT EXTRACTION Left 02/2020   CIRCUMCISION  2011   POLYPECTOMY     vocal cords   PROSTATE SURGERY      Social History Aaron Carlson  reports that he has quit smoking. He has never used smokeless tobacco. He reports current alcohol use. He reports that he does not use drugs.  family history includes Breast cancer in his mother; Diabetes in his mother; Heart disease in his  father; Heart failure in his father; Pancreatic cancer in his brother.  Allergies  Allergen Reactions   Penicillins Rash       PHYSICAL EXAMINATION: Vital signs: BP (!) 126/54   Pulse 62   Ht 5\' 8"  (1.727 m)   Wt 206 lb (93.4 kg)   BMI 31.32 kg/m   Constitutional: generally well-appearing, no acute distress Psychiatric: alert and oriented x3, cooperative Eyes: extraocular movements intact, anicteric, conjunctiva pink Mouth: oral pharynx moist, no lesions Neck: supple no lymphadenopathy Cardiovascular: heart regular rate and rhythm, no murmur Lungs: clear to auscultation bilaterally Abdomen: soft, nontender, nondistended, no obvious ascites, no peritoneal signs, normal bowel sounds, no organomegaly Rectal: Omitted Extremities: no clubbing, cyanosis, or lower extremity edema bilaterally Skin: no lesions on visible extremities Neuro: No focal deficits.  Cranial nerves intact  ASSESSMENT:  1.  Functional dyspepsia.  Negative extensive work-up.  Doing better with IBgard before meals 2.  Transient problems with diarrhea.  Resolved 3.  Cholelithiasis.  Felt to be incidental at this point. 4.  GERD.  Managed with current regimen including Gaviscon at night for breakthrough symptoms   PLAN:  1.  Continue current medical therapies 2.  Again provided reassurance 3.  Routine office follow-up 6 months

## 2021-06-24 DIAGNOSIS — E785 Hyperlipidemia, unspecified: Secondary | ICD-10-CM | POA: Diagnosis not present

## 2021-06-24 DIAGNOSIS — N1832 Chronic kidney disease, stage 3b: Secondary | ICD-10-CM | POA: Diagnosis not present

## 2021-06-24 DIAGNOSIS — E1165 Type 2 diabetes mellitus with hyperglycemia: Secondary | ICD-10-CM | POA: Diagnosis not present

## 2021-06-24 DIAGNOSIS — D649 Anemia, unspecified: Secondary | ICD-10-CM | POA: Diagnosis not present

## 2021-06-24 DIAGNOSIS — I110 Hypertensive heart disease with heart failure: Secondary | ICD-10-CM | POA: Diagnosis not present

## 2021-06-24 DIAGNOSIS — I5030 Unspecified diastolic (congestive) heart failure: Secondary | ICD-10-CM | POA: Diagnosis not present

## 2021-06-24 DIAGNOSIS — I130 Hypertensive heart and chronic kidney disease with heart failure and stage 1 through stage 4 chronic kidney disease, or unspecified chronic kidney disease: Secondary | ICD-10-CM | POA: Diagnosis not present

## 2021-06-24 DIAGNOSIS — K219 Gastro-esophageal reflux disease without esophagitis: Secondary | ICD-10-CM | POA: Diagnosis not present

## 2021-06-24 DIAGNOSIS — E1122 Type 2 diabetes mellitus with diabetic chronic kidney disease: Secondary | ICD-10-CM | POA: Diagnosis not present

## 2021-07-03 ENCOUNTER — Telehealth: Payer: Self-pay | Admitting: Internal Medicine

## 2021-07-03 NOTE — Telephone Encounter (Signed)
Pt called stating his PCP Dr. Rockwell [place] had some labs done and wanted to make sure Dr. Henrene Pastor saw them and if he had any comment regarding the results.  Please call and advise.

## 2021-07-04 NOTE — Telephone Encounter (Signed)
Triagge-y??

## 2021-07-04 NOTE — Telephone Encounter (Signed)
Left message for pt that his labs from his PCP are not in epic. Let him know he can bring [place] the labs and Dr. Henrene Pastor can review them.

## 2021-07-07 DIAGNOSIS — N390 Urinary tract infection, site not specified: Secondary | ICD-10-CM | POA: Diagnosis not present

## 2021-07-07 DIAGNOSIS — N1832 Chronic kidney disease, stage 3b: Secondary | ICD-10-CM | POA: Diagnosis not present

## 2021-07-08 DIAGNOSIS — Z0000 Encounter for general adult medical examination without abnormal findings: Secondary | ICD-10-CM | POA: Diagnosis not present

## 2021-07-08 DIAGNOSIS — Z0001 Encounter for general adult medical examination with abnormal findings: Secondary | ICD-10-CM | POA: Diagnosis not present

## 2021-07-08 DIAGNOSIS — Z008 Encounter for other general examination: Secondary | ICD-10-CM | POA: Diagnosis not present

## 2021-07-08 DIAGNOSIS — K219 Gastro-esophageal reflux disease without esophagitis: Secondary | ICD-10-CM | POA: Diagnosis not present

## 2021-07-09 DIAGNOSIS — I130 Hypertensive heart and chronic kidney disease with heart failure and stage 1 through stage 4 chronic kidney disease, or unspecified chronic kidney disease: Secondary | ICD-10-CM | POA: Diagnosis not present

## 2021-07-09 DIAGNOSIS — E785 Hyperlipidemia, unspecified: Secondary | ICD-10-CM | POA: Diagnosis not present

## 2021-07-09 DIAGNOSIS — N1832 Chronic kidney disease, stage 3b: Secondary | ICD-10-CM | POA: Diagnosis not present

## 2021-07-09 DIAGNOSIS — E1165 Type 2 diabetes mellitus with hyperglycemia: Secondary | ICD-10-CM | POA: Diagnosis not present

## 2021-07-09 DIAGNOSIS — I110 Hypertensive heart disease with heart failure: Secondary | ICD-10-CM | POA: Diagnosis not present

## 2021-07-09 DIAGNOSIS — E1129 Type 2 diabetes mellitus with other diabetic kidney complication: Secondary | ICD-10-CM | POA: Diagnosis not present

## 2021-07-09 DIAGNOSIS — I5030 Unspecified diastolic (congestive) heart failure: Secondary | ICD-10-CM | POA: Diagnosis not present

## 2021-07-09 DIAGNOSIS — E1122 Type 2 diabetes mellitus with diabetic chronic kidney disease: Secondary | ICD-10-CM | POA: Diagnosis not present

## 2021-07-09 DIAGNOSIS — K219 Gastro-esophageal reflux disease without esophagitis: Secondary | ICD-10-CM | POA: Diagnosis not present

## 2021-07-10 ENCOUNTER — Telehealth: Payer: Self-pay | Admitting: Interventional Cardiology

## 2021-07-10 MED ORDER — ISOSORBIDE MONONITRATE ER 60 MG PO TB24: 60.0000 mg | ORAL_TABLET | Freq: Every day | ORAL | 3 refills | Status: AC

## 2021-07-10 NOTE — Telephone Encounter (Signed)
*  STAT* If patient is at the pharmacy, call can be transferred to refill team.   1. Which medications need to be refilled? (please list name of each medication and dose if known)  isosorbide mononitrate (IMDUR) 60 MG 24 hr tablet  2. Which pharmacy/location (including street and city if local pharmacy) is medication to be sent to? [place], [place]  3. Do they need a 30 day or 90 day supply?  90 day supply

## 2021-07-10 NOTE — Telephone Encounter (Signed)
Pt's medication was sent to pt's pharmacy as requested. Confirmation received.  °

## 2021-07-24 ENCOUNTER — Telehealth: Payer: Self-pay | Admitting: Internal Medicine

## 2021-07-24 NOTE — Telephone Encounter (Signed)
Patient said he received a call from our office asking him to make an appointment with Dr. Henrene Pastor (which I did) but I did not see any notes saying anyone had called him or for what reason an appointment was needed.  Can you please call and advise?

## 2021-07-25 ENCOUNTER — Other Ambulatory Visit: Payer: Self-pay | Admitting: Interventional Cardiology

## 2021-07-25 NOTE — Telephone Encounter (Signed)
Spoke with pt and he is not having any problems at this time. Does not appear that anyone from the office called the pt. His appt has been cancelled, he knows to call if he starts having any issues.

## 2021-07-28 DIAGNOSIS — H401131 Primary open-angle glaucoma, bilateral, mild stage: Secondary | ICD-10-CM | POA: Diagnosis not present

## 2021-07-28 DIAGNOSIS — H1045 Other chronic allergic conjunctivitis: Secondary | ICD-10-CM | POA: Diagnosis not present

## 2021-07-28 DIAGNOSIS — E119 Type 2 diabetes mellitus without complications: Secondary | ICD-10-CM | POA: Diagnosis not present

## 2021-07-28 DIAGNOSIS — H43822 Vitreomacular adhesion, left eye: Secondary | ICD-10-CM | POA: Diagnosis not present

## 2021-07-28 DIAGNOSIS — H25811 Combined forms of age-related cataract, right eye: Secondary | ICD-10-CM | POA: Diagnosis not present

## 2021-07-28 DIAGNOSIS — H35352 Cystoid macular degeneration, left eye: Secondary | ICD-10-CM | POA: Diagnosis not present

## 2021-07-28 DIAGNOSIS — Z961 Presence of intraocular lens: Secondary | ICD-10-CM | POA: Diagnosis not present

## 2021-07-28 DIAGNOSIS — H35372 Puckering of macula, left eye: Secondary | ICD-10-CM | POA: Diagnosis not present

## 2021-07-28 DIAGNOSIS — H16142 Punctate keratitis, left eye: Secondary | ICD-10-CM | POA: Diagnosis not present

## 2021-07-30 ENCOUNTER — Ambulatory Visit: Payer: Medicare HMO | Admitting: Internal Medicine

## 2021-08-12 ENCOUNTER — Other Ambulatory Visit: Payer: Self-pay

## 2021-08-12 ENCOUNTER — Encounter: Payer: Self-pay | Admitting: Podiatry

## 2021-08-12 ENCOUNTER — Ambulatory Visit: Payer: Medicare HMO | Admitting: Podiatry

## 2021-08-12 DIAGNOSIS — E119 Type 2 diabetes mellitus without complications: Secondary | ICD-10-CM | POA: Diagnosis not present

## 2021-08-12 DIAGNOSIS — M79674 Pain in right toe(s): Secondary | ICD-10-CM

## 2021-08-12 DIAGNOSIS — E7800 Pure hypercholesterolemia, unspecified: Secondary | ICD-10-CM | POA: Insufficient documentation

## 2021-08-12 DIAGNOSIS — M2042 Other hammer toe(s) (acquired), left foot: Secondary | ICD-10-CM | POA: Diagnosis not present

## 2021-08-12 DIAGNOSIS — M79675 Pain in left toe(s): Secondary | ICD-10-CM

## 2021-08-12 DIAGNOSIS — M2041 Other hammer toe(s) (acquired), right foot: Secondary | ICD-10-CM

## 2021-08-12 DIAGNOSIS — Q828 Other specified congenital malformations of skin: Secondary | ICD-10-CM

## 2021-08-12 DIAGNOSIS — I714 Abdominal aortic aneurysm, without rupture, unspecified: Secondary | ICD-10-CM | POA: Insufficient documentation

## 2021-08-12 DIAGNOSIS — E1151 Type 2 diabetes mellitus with diabetic peripheral angiopathy without gangrene: Secondary | ICD-10-CM

## 2021-08-12 DIAGNOSIS — B351 Tinea unguium: Secondary | ICD-10-CM | POA: Diagnosis not present

## 2021-08-16 NOTE — Progress Notes (Signed)
ANNUAL DIABETIC FOOT EXAM  Subjective: Aaron Carlson presents today for for annual diabetic foot examination, at risk foot care. Pt has h/o NIDDM with PAD, and painful porokeratotic lesion(s) of both feet and painful mycotic toenails that limit ambulation. Painful toenails interfere with ambulation. Aggravating factors include wearing enclosed shoe gear. Pain is relieved with periodic professional debridement. Painful porokeratotic lesions are aggravated when weightbearing with and without shoegear. Pain is relieved with periodic professional debridement..  Patient relates several year h/o diabetes.  Patient denies any h/o foot wounds.  Patient denies any numbness, tingling, burning, or pins/needle sensation in feet.  Patient's blood sugar was 230 mg/dl last night. Patient did not check blood glucose this morning.  Aaron Contras, MD is patient's PCP. Last visit was 06/24/2021.  Past Medical History:  Diagnosis Date   CAD in native artery 08/28/2013   Heaviness in chest with exertion. Coronary angiography 2005 demonstrated severe distal LAD and diagonal disease, severe distal RCA and PL OM disease, and severe first obtuse marginal disease. No high-grade proximal coronary disease was present at the time.  The most recent myocardial perfusion study in 2014 was nonischemic/low risk     Chronic renal insufficiency    Colon polyps    adenomatous   Diabetes mellitus    Hyperlipemia    Hypertension    Laryngeal papillomatosis    Prostate cancer ([place])    w/ mets to bladder   Patient Active Problem List   Diagnosis Date Noted   Abdominal aortic aneurysm without rupture 08/12/2021   Pure hypercholesterolemia 08/12/2021   Cystoid macular edema, left eye 12/04/2020   Vitreomacular traction syndrome of left eye 12/04/2020   Primary open angle glaucoma of left eye, severe stage 12/04/2020   Diabetes mellitus without complication ([place]) 24/23/5361   Left epiretinal membrane 12/04/2020   Serous  retinal detachment, left eye 12/04/2020   Anemia 11/29/2020   Diabetic renal disease ([place]) 11/29/2020   Glaucoma 11/29/2020   Hyperglycemia due to type 2 diabetes mellitus ([place]) 11/29/2020   History of malignant neoplasm of prostate 11/29/2020   Long term (current) use of insulin ([place]) 11/29/2020   Morbid obesity ([place]) 11/29/2020   Flatulence/gas pain/belching 12/12/2018   Gastritis 12/12/2018   Chronic diastolic heart failure ([place]) 09/09/2016   CKD (chronic kidney disease) stage 3, GFR 30-59 ml/min (HCC) 08/11/2016   RBBB 12/28/2013   Coronary artery disease involving native coronary artery of native heart with angina pectoris ([place]) 08/28/2013    Class: Chronic   RBBB (right bundle branch block with left anterior fascicular block) 08/28/2013   Hyperlipidemia 08/28/2013    Class: Chronic   Papilloma of larynx 08/26/2011   IDDM 02/01/2007   Essential hypertension 02/01/2007   RHINITIS, ALLERGIC NOS 02/01/2007   Past Surgical History:  Procedure Laterality Date   BLADDER SURGERY     CATARACT EXTRACTION Left 02/2020   CIRCUMCISION  2011   POLYPECTOMY     vocal cords   PROSTATE SURGERY     Current Outpatient Medications on File Prior to Visit  Medication Sig Dispense Refill   Continuous Blood Gluc Receiver (FREESTYLE LIBRE 2 READER) DEVI See admin instructions.     Continuous Blood Gluc Sensor (FREESTYLE LIBRE 2 SENSOR) MISC apply to upper back of arm     Accu-Chek Softclix Lancets lancets      Alcohol Swabs 70 % PADS use when checking blood sugar     atorvastatin (LIPITOR) 20 MG tablet 1 tablet     BD INSULIN SYRINGE  ULTRAFINE 31G X 5/16" 0.5 ML MISC Use as directed     benazepril (LOTENSIN) 20 MG tablet TAKE 1 TABLET EVERY DAY 90 tablet 3   Blood Glucose Monitoring Suppl (TRUE METRIX METER) w/Device KIT      brimonidine (ALPHAGAN) 0.2 % ophthalmic solution 1 drop 2 (two) times daily.     calcium carbonate (TUMS - DOSED IN MG ELEMENTAL CALCIUM) 500 MG chewable tablet Chew 1  tablet by mouth daily as needed for indigestion or heartburn.     fluorometholone (FML) 0.1 % ophthalmic suspension 1 drop into affected eye     fluticasone (FLONASE) 50 MCG/ACT nasal spray Place into both nostrils daily as needed for allergies or rhinitis.     furosemide (LASIX) 80 MG tablet Take 80 mg by mouth daily.     Garlic 888 MG TABS Take 1 tablet by mouth daily.     insulin lispro (HUMALOG) 100 UNIT/ML injection Use as directed daily at bedtime as directed per sliding scale.     insulin NPH Human (HUMULIN N,NOVOLIN N) 100 UNIT/ML injection Inject 38 Units into the skin at bedtime.      Iron Combinations (CHROMAGEN) capsule Take 1 capsule by mouth daily.     isosorbide mononitrate (IMDUR) 60 MG 24 hr tablet Take 1 tablet (60 mg total) by mouth daily. 90 tablet 3   ketorolac (ACULAR) 0.5 % ophthalmic solution 1 drop into affected eye  as needed     latanoprost (XALATAN) 0.005 % ophthalmic solution Place 1 drop into both eyes at bedtime.     Latanoprostene Bunod (VYZULTA) 0.024 % SOLN 1 drop into affected eye in the evening     MAGNESIUM PO Take 1 tablet by mouth daily.     metoprolol succinate (TOPROL XL) 25 MG 24 hr tablet Take 1 tablet (25 mg total) by mouth daily. 90 tablet 3   Multiple Vitamin (MULTIVITAMIN) tablet Take 1 tablet by mouth daily.     niacin (NIASPAN) 500 MG CR tablet Take 500 mg by mouth at bedtime.     niacin (SLO-NIACIN) 500 MG tablet 1 tablet with food     nitroGLYCERIN (NITROSTAT) 0.4 MG SL tablet Place 1 tablet (0.4 mg total) under the tongue every 5 (five) minutes as needed for chest pain. 25 tablet 3   Omega-3 Fatty Acids (FISH OIL) 1000 MG CAPS 1 capsule     ONE TOUCH ULTRA TEST test strip Use as directed     pantoprazole (PROTONIX) 40 MG tablet      Peppermint Oil (IBGARD) 90 MG CPCR 3-4 capsules     potassium chloride (KLOR-CON) 20 MEQ packet Take 20 mEq by mouth daily.     Probiotic TBEC See admin instructions.     simethicone (MYLICON) 916 MG chewable  tablet Chew 125 mg by mouth every 6 (six) hours as needed for flatulence.     White Petrolatum-Mineral Oil (ARTIFICIAL TEARS) ointment Place into the left eye in the morning, at noon, and at bedtime.     No current facility-administered medications on file prior to visit.    Allergies  Allergen Reactions   Penicillins Rash   Social History   Occupational History   Occupation: Retired  Tobacco Use   Smoking status: Former   Smokeless tobacco: Never  Scientific laboratory technician Use: Never used  Substance and Sexual Activity   Alcohol use: Yes    Comment: social   Drug use: No   Sexual activity: Not on file   Family  History  Problem Relation Age of Onset   Breast cancer Mother    Diabetes Mother    Heart failure Father    Heart disease Father    Pancreatic cancer Brother    Colon cancer Neg Hx    Esophageal cancer Neg Hx    Liver disease Neg Hx    Immunization History  Administered Date(s) Administered   Influenza-Unspecified 06/13/2015     Review of Systems: Negative except as noted in the HPI.   Objective: There were no vitals filed for this visit.  Aaron Carlson is a pleasant 87 y.o. male in NAD. AAO X 3.  Vascular Examination: CFT <3 seconds b/l LE. Faintly palpable DP pulses b/l. Nonpalpable PT pulses b/l. Pedal hair absent b/l. Skin temperature gradient WNL b/l. No pain with calf compression b/l. No edema b/l LE. No cyanosis or clubbing noted b/l LE.  Dermatological Examination: Pedal skin thin, shiny and atrophic b/l LE. No open wounds b/l LE. No interdigital macerations noted b/l LE. Toenails 1-5 b/l elongated, discolored, dystrophic, thickened, crumbly with subungual debris and tenderness to dorsal palpation. Porokeratotic lesion(s) submet head 2 b/l. No erythema, no edema, no drainage, no fluctuance.  Musculoskeletal Examination: Normal muscle strength 5/5 to all lower extremity muscle groups bilaterally. Hammertoe(s) noted to the L 2nd toe and R 2nd  toe.  Footwear Assessment: Does the patient wear appropriate shoes? Yes. Does the patient need inserts/orthotics? Yes.  Neurological Examination: Protective sensation intact 5/5 intact bilaterally with 10g monofilament b/l. Vibratory sensation intact b/l.  Assessment: 1. Pain due to onychomycosis of toenails of both feet   2. Porokeratosis   3. Type II diabetes mellitus with peripheral circulatory disorder (HCC)   4. Acquired hammertoes of both feet   5. Encounter for diabetic foot exam ([place])      ADA Risk Categorization: High Risk  Patient has one or more of the following: Loss of protective sensation Absent pedal pulses Severe Foot deformity History of foot ulcer  Plan: -Diabetic foot examination performed today. -Continue diabetic foot care principles: inspect feet daily, monitor glucose as recommended by PCP and/or Endocrinologist, and follow prescribed diet per PCP, Endocrinologist and/or dietician. -Mycotic toenails 1-5 bilaterally were debrided in length and girth with sterile nail nippers and dremel without incident. -Painful porokeratotic lesion(s) submet head 2 b/l pared and enucleated with sterile scalpel blade without incident. Total number of lesions debrided=2. -Patient/POA to call should there be question/concern in the interim.  Return in about 3 months (around 11/12/2021).  Aaron Carlson, DPM

## 2021-08-19 DIAGNOSIS — E669 Obesity, unspecified: Secondary | ICD-10-CM | POA: Diagnosis not present

## 2021-08-19 DIAGNOSIS — E1122 Type 2 diabetes mellitus with diabetic chronic kidney disease: Secondary | ICD-10-CM | POA: Diagnosis not present

## 2021-08-19 DIAGNOSIS — I2510 Atherosclerotic heart disease of native coronary artery without angina pectoris: Secondary | ICD-10-CM | POA: Diagnosis not present

## 2021-08-19 DIAGNOSIS — Z794 Long term (current) use of insulin: Secondary | ICD-10-CM | POA: Diagnosis not present

## 2021-09-03 ENCOUNTER — Other Ambulatory Visit: Payer: Self-pay

## 2021-09-03 ENCOUNTER — Ambulatory Visit (INDEPENDENT_AMBULATORY_CARE_PROVIDER_SITE_OTHER): Payer: Medicare HMO | Admitting: Ophthalmology

## 2021-09-03 ENCOUNTER — Encounter (INDEPENDENT_AMBULATORY_CARE_PROVIDER_SITE_OTHER): Payer: Self-pay | Admitting: Ophthalmology

## 2021-09-03 DIAGNOSIS — H35372 Puckering of macula, left eye: Secondary | ICD-10-CM | POA: Diagnosis not present

## 2021-09-03 DIAGNOSIS — H35352 Cystoid macular degeneration, left eye: Secondary | ICD-10-CM

## 2021-09-03 DIAGNOSIS — H25041 Posterior subcapsular polar age-related cataract, right eye: Secondary | ICD-10-CM

## 2021-09-03 DIAGNOSIS — H43822 Vitreomacular adhesion, left eye: Secondary | ICD-10-CM

## 2021-09-03 NOTE — Assessment & Plan Note (Signed)
Nursing clinically but not impacting vision

## 2021-09-03 NOTE — Patient Instructions (Signed)
Patient to continue on topical NSAIDs, ketorolac 1 drop left eye twice daily

## 2021-09-03 NOTE — Assessment & Plan Note (Signed)
Resolved on topical NSAIDs OS, will suggest however to use topical twice daily NSAIDs for long-term control

## 2021-09-03 NOTE — Assessment & Plan Note (Signed)
New PSC changes seen today, nearing visual axis in addition to nuclear sclerotic cataract follow-up with Dr. Katy Fitch as

## 2021-09-03 NOTE — Assessment & Plan Note (Signed)
May have released into spontaneous PVD OS

## 2021-09-03 NOTE — Progress Notes (Signed)
09/03/2021     CHIEF COMPLAINT Patient presents for  Chief Complaint  Patient presents with   Retina Follow Up      HISTORY OF PRESENT ILLNESS: Aaron Carlson is a 87 y.o. male who presents to the clinic today for:   HPI     Retina Follow Up           Diagnosis: Other   Laterality: both eyes   Onset: 6 months ago   Severity: mild   Duration: 6 months   Course: stable         Comments   6 mos fu OU oct. Patient states vision is stable and unchanged since last visit. Denies any new floaters or FOL. Pt is using ketorolac BID OS, prednisolone BID OU, brimonidine BID OU.      Last edited by Nelva Nay on 09/03/2021 11:17 AM.      Referring physician: Sallye Lat, MD [address] [place],  [place] [address]  HISTORICAL INFORMATION:   Selected notes from the MEDICAL RECORD NUMBER    Lab Results  Component Value Date   HGBA1C 7.5 (H) 11/04/2006     CURRENT MEDICATIONS: Current Outpatient Medications (Ophthalmic Drugs)  Medication Sig   brimonidine (ALPHAGAN) 0.2 % ophthalmic solution 1 drop 2 (two) times daily.   fluorometholone (FML) 0.1 % ophthalmic suspension 1 drop into affected eye   ketorolac (ACULAR) 0.5 % ophthalmic solution 1 drop into affected eye  as needed   latanoprost (XALATAN) 0.005 % ophthalmic solution Place 1 drop into both eyes at bedtime.   Latanoprostene Bunod (VYZULTA) 0.024 % SOLN 1 drop into affected eye in the evening   White Petrolatum-Mineral Oil (ARTIFICIAL TEARS) ointment Place into the left eye in the morning, at noon, and at bedtime.   No current facility-administered medications for this visit. (Ophthalmic Drugs)   Current Outpatient Medications (Other)  Medication Sig   Accu-Chek Softclix Lancets lancets    Alcohol Swabs 70 % PADS use when checking blood sugar   atorvastatin (LIPITOR) 20 MG tablet 1 tablet   BD INSULIN SYRINGE ULTRAFINE 31G X 5/16" 0.5 ML MISC Use as directed   benazepril  (LOTENSIN) 20 MG tablet TAKE 1 TABLET EVERY DAY   Blood Glucose Monitoring Suppl (TRUE METRIX METER) w/Device KIT    calcium carbonate (TUMS - DOSED IN MG ELEMENTAL CALCIUM) 500 MG chewable tablet Chew 1 tablet by mouth daily as needed for indigestion or heartburn.   Continuous Blood Gluc Receiver (FREESTYLE LIBRE 2 READER) DEVI See admin instructions.   Continuous Blood Gluc Sensor (FREESTYLE LIBRE 2 SENSOR) MISC apply to upper back of arm   fluticasone (FLONASE) 50 MCG/ACT nasal spray Place into both nostrils daily as needed for allergies or rhinitis.   furosemide (LASIX) 80 MG tablet Take 80 mg by mouth daily.   Garlic 500 MG TABS Take 1 tablet by mouth daily.   insulin lispro (HUMALOG) 100 UNIT/ML injection Use as directed daily at bedtime as directed per sliding scale.   insulin NPH Human (HUMULIN N,NOVOLIN N) 100 UNIT/ML injection Inject 38 Units into the skin at bedtime.    Iron Combinations (CHROMAGEN) capsule Take 1 capsule by mouth daily.   isosorbide mononitrate (IMDUR) 60 MG 24 hr tablet Take 1 tablet (60 mg total) by mouth daily.   MAGNESIUM PO Take 1 tablet by mouth daily.   metoprolol succinate (TOPROL XL) 25 MG 24 hr tablet Take 1 tablet (25 mg total) by mouth daily.  Multiple Vitamin (MULTIVITAMIN) tablet Take 1 tablet by mouth daily.   niacin (NIASPAN) 500 MG CR tablet Take 500 mg by mouth at bedtime.   niacin (SLO-NIACIN) 500 MG tablet 1 tablet with food   nitroGLYCERIN (NITROSTAT) 0.4 MG SL tablet Place 1 tablet (0.4 mg total) under the tongue every 5 (five) minutes as needed for chest pain.   Omega-3 Fatty Acids (FISH OIL) 1000 MG CAPS 1 capsule   ONE TOUCH ULTRA TEST test strip Use as directed   pantoprazole (PROTONIX) 40 MG tablet    Peppermint Oil (IBGARD) 90 MG CPCR 3-4 capsules   potassium chloride (KLOR-CON) 20 MEQ packet Take 20 mEq by mouth daily.   Probiotic TBEC See admin instructions.   simethicone (MYLICON) 125 MG chewable tablet Chew 125 mg by mouth every 6  (six) hours as needed for flatulence.   No current facility-administered medications for this visit. (Other)      REVIEW OF SYSTEMS:    ALLERGIES Allergies  Allergen Reactions   Penicillins Rash    PAST MEDICAL HISTORY Past Medical History:  Diagnosis Date   CAD in native artery 08/28/2013   Heaviness in chest with exertion. Coronary angiography 2005 demonstrated severe distal LAD and diagonal disease, severe distal RCA and PL OM disease, and severe first obtuse marginal disease. No high-grade proximal coronary disease was present at the time.  The most recent myocardial perfusion study in 2014 was nonischemic/low risk     Chronic renal insufficiency    Colon polyps    adenomatous   Diabetes mellitus    Hyperlipemia    Hypertension    Laryngeal papillomatosis    Prostate cancer (HCC)    w/ mets to bladder   Past Surgical History:  Procedure Laterality Date   BLADDER SURGERY     CATARACT EXTRACTION Left 02/2020   CIRCUMCISION  2011   POLYPECTOMY     vocal cords   PROSTATE SURGERY      FAMILY HISTORY Family History  Problem Relation Age of Onset   Breast cancer Mother    Diabetes Mother    Heart failure Father    Heart disease Father    Pancreatic cancer Brother    Colon cancer Neg Hx    Esophageal cancer Neg Hx    Liver disease Neg Hx     SOCIAL HISTORY Social History   Tobacco Use   Smoking status: Former   Smokeless tobacco: Never  Building services engineer Use: Never used  Substance Use Topics   Alcohol use: Yes    Comment: social   Drug use: No         OPHTHALMIC EXAM:  Base Eye Exam     Visual Acuity (ETDRS)       Right Left   Dist cc 20/25 -2 20/50 -2   Dist ph cc  20/30 -2    Correction: Glasses         Tonometry (Tonopen, 11:20 AM)       Right Left   Pressure 7 9         Pupils       Pupils Dark Light APD   Right PERRL 3 2 None   Left PERRL 3 2 None         Extraocular Movement       Right Left    Full Full          Neuro/Psych     Oriented x3: Yes   Mood/Affect: Normal  Dilation     Both eyes: 1.0% Mydriacyl, 2.5% Phenylephrine @ 11:20 AM           Slit Lamp and Fundus Exam     External Exam       Right Left   External Normal Normal         Slit Lamp Exam       Right Left   Lids/Lashes Normal Normal   Conjunctiva/Sclera White and quiet White and quiet   Cornea Clear Clear   Anterior Chamber Deep and quiet Deep and quiet   Iris Round and reactive Round and reactive   Lens 2+ Nuclear sclerosis, 2+ Posterior subcapsular cataract Clear   Anterior Vitreous Normal Normal         Fundus Exam       Right Left   Posterior Vitreous Apparent vitreomacular adhesion seen in a ringlike fashion elevation on the epiretinal membrane the macula Apparent vitreomacular adhesion seen in a ringlike fashion elevation on the epiretinal membrane the macula   Disc Thin rim Thin rim   C/D Ratio 0.65 0.65   Macula Normal Epiretinal membrane minor, no CME, no macular thickening   Vessels no DR no DR   Periphery Normal Normal            IMAGING AND PROCEDURES  Imaging and Procedures for 09/03/21  OCT, Retina - OU - Both Eyes       Right Eye Quality was borderline. Scan locations included subfoveal. Central Foveal Thickness: 240. Progression has been stable. Findings include normal foveal contour.   Left Eye Quality was good. Scan locations included subfoveal. Central Foveal Thickness: 258. Progression has improved. Findings include abnormal foveal contour, cystoid macular edema.   Notes OS much improved now only on topical NSAIDs.  Much less diffuse macular edema but most importantly left eye Much less subfoveal photoreceptor layer collection of material   OU looks great now some 9 months after commencement of therapy for pseudophakic CME OS             ASSESSMENT/PLAN:  Cystoid macular edema, left eye Resolved on topical NSAIDs OS, will suggest however  to use topical twice daily NSAIDs for long-term control  Vitreomacular traction syndrome of left eye May have released into spontaneous PVD OS  Left epiretinal membrane Nursing clinically but not impacting vision  Posterior subcapsular age-related cataract, right eye New PSC changes seen today, nearing visual axis in addition to nuclear sclerotic cataract follow-up with Dr. Katy Fitch as     ICD-10-CM   1. Cystoid macular edema, left eye  H35.352 OCT, Retina - OU - Both Eyes    2. Vitreomacular traction syndrome of left eye  H43.822     3. Left epiretinal membrane  H35.372     4. Posterior subcapsular age-related cataract, right eye  H25.041       1.  OS pseudophakic CME now resolved, now on chronic twice daily ketorolac we will continue for the remaining 6 months until follow-up visit again  2.  OD, I did explain the patient that the [facility] changes seen in the past OD are now accompanied by [place] changes during the visual axis, follow-up with [facility] as scheduled in the coming month  3.  Ophthalmic Meds Ordered this visit:  No orders of the defined types were placed in this encounter.      Return in about 6 months (around 03/04/2022) for DILATE OU, OCT.  There are no Patient Instructions on file for this  visit.   Explained the diagnoses, plan, and follow up with the patient and they expressed understanding.  Patient expressed understanding of the importance of proper follow up care.   Clent Demark Nike Southers M.D. Diseases & Surgery of the Retina and Vitreous Retina & Diabetic [place] 09/03/21     Abbreviations: M myopia (nearsighted); A astigmatism; H hyperopia (farsighted); P presbyopia; Mrx spectacle prescription;  CTL contact lenses; OD right eye; OS left eye; OU both eyes  XT exotropia; ET esotropia; PEK punctate epithelial keratitis; PEE punctate epithelial erosions; DES dry eye syndrome; MGD meibomian gland dysfunction; ATs artificial tears; PFAT's preservative free  artificial tears; [place] nuclear sclerotic cataract; PSC posterior subcapsular cataract; ERM epi-retinal membrane; PVD posterior vitreous detachment; RD retinal detachment; DM diabetes mellitus; DR diabetic retinopathy; NPDR non-proliferative diabetic retinopathy; PDR proliferative diabetic retinopathy; CSME clinically significant macular edema; DME diabetic macular edema; dbh dot blot hemorrhages; CWS cotton wool spot; POAG primary open angle glaucoma; C/D cup-to-disc ratio; HVF humphrey visual field; GVF goldmann visual field; OCT optical coherence tomography; IOP intraocular pressure; BRVO Branch retinal vein occlusion; CRVO central retinal vein occlusion; CRAO central retinal artery occlusion; BRAO branch retinal artery occlusion; RT retinal tear; SB scleral buckle; PPV pars plana vitrectomy; VH Vitreous hemorrhage; PRP panretinal laser photocoagulation; IVK intravitreal kenalog; VMT vitreomacular traction; MH Macular hole;  NVD neovascularization of the disc; NVE neovascularization elsewhere; AREDS age related eye disease study; ARMD age related macular degeneration; POAG primary open angle glaucoma; EBMD epithelial/anterior basement membrane dystrophy; ACIOL anterior chamber intraocular lens; IOL intraocular lens; PCIOL posterior chamber intraocular lens; Phaco/IOL phacoemulsification with intraocular lens placement; [place] photorefractive keratectomy; LASIK laser assisted in situ keratomileusis; HTN hypertension; DM diabetes mellitus; COPD chronic obstructive pulmonary disease

## 2021-09-20 ENCOUNTER — Inpatient Hospital Stay (HOSPITAL_COMMUNITY): Payer: Medicare HMO

## 2021-09-20 ENCOUNTER — Inpatient Hospital Stay (HOSPITAL_COMMUNITY)
Admission: EM | Admit: 2021-09-20 | Discharge: 2021-09-28 | DRG: 286 | Disposition: E | Payer: Medicare HMO | Attending: Internal Medicine | Admitting: Internal Medicine

## 2021-09-20 ENCOUNTER — Other Ambulatory Visit: Payer: Self-pay

## 2021-09-20 ENCOUNTER — Emergency Department (HOSPITAL_COMMUNITY): Payer: Medicare HMO

## 2021-09-20 DIAGNOSIS — R570 Cardiogenic shock: Secondary | ICD-10-CM | POA: Diagnosis not present

## 2021-09-20 DIAGNOSIS — R918 Other nonspecific abnormal finding of lung field: Secondary | ICD-10-CM | POA: Diagnosis not present

## 2021-09-20 DIAGNOSIS — Z9289 Personal history of other medical treatment: Secondary | ICD-10-CM

## 2021-09-20 DIAGNOSIS — R000 Tachycardia, unspecified: Secondary | ICD-10-CM | POA: Diagnosis not present

## 2021-09-20 DIAGNOSIS — R0689 Other abnormalities of breathing: Secondary | ICD-10-CM | POA: Diagnosis not present

## 2021-09-20 DIAGNOSIS — J690 Pneumonitis due to inhalation of food and vomit: Secondary | ICD-10-CM | POA: Diagnosis not present

## 2021-09-20 DIAGNOSIS — S0003XA Contusion of scalp, initial encounter: Secondary | ICD-10-CM | POA: Diagnosis not present

## 2021-09-20 DIAGNOSIS — Z452 Encounter for adjustment and management of vascular access device: Secondary | ICD-10-CM

## 2021-09-20 DIAGNOSIS — S066XAA Traumatic subarachnoid hemorrhage with loss of consciousness status unknown, initial encounter: Secondary | ICD-10-CM | POA: Diagnosis not present

## 2021-09-20 DIAGNOSIS — Z01818 Encounter for other preprocedural examination: Secondary | ICD-10-CM

## 2021-09-20 DIAGNOSIS — A419 Sepsis, unspecified organism: Secondary | ICD-10-CM | POA: Diagnosis not present

## 2021-09-20 DIAGNOSIS — N1832 Chronic kidney disease, stage 3b: Secondary | ICD-10-CM | POA: Diagnosis present

## 2021-09-20 DIAGNOSIS — S061XAA Traumatic cerebral edema with loss of consciousness status unknown, initial encounter: Secondary | ICD-10-CM | POA: Diagnosis not present

## 2021-09-20 DIAGNOSIS — Z66 Do not resuscitate: Secondary | ICD-10-CM | POA: Diagnosis not present

## 2021-09-20 DIAGNOSIS — N179 Acute kidney failure, unspecified: Secondary | ICD-10-CM | POA: Diagnosis present

## 2021-09-20 DIAGNOSIS — W19XXXA Unspecified fall, initial encounter: Secondary | ICD-10-CM | POA: Diagnosis present

## 2021-09-20 DIAGNOSIS — I462 Cardiac arrest due to underlying cardiac condition: Secondary | ICD-10-CM | POA: Diagnosis present

## 2021-09-20 DIAGNOSIS — E872 Acidosis, unspecified: Secondary | ICD-10-CM | POA: Diagnosis present

## 2021-09-20 DIAGNOSIS — E440 Moderate protein-calorie malnutrition: Secondary | ICD-10-CM | POA: Insufficient documentation

## 2021-09-20 DIAGNOSIS — J9811 Atelectasis: Secondary | ICD-10-CM | POA: Diagnosis not present

## 2021-09-20 DIAGNOSIS — I499 Cardiac arrhythmia, unspecified: Secondary | ICD-10-CM | POA: Diagnosis not present

## 2021-09-20 DIAGNOSIS — R1310 Dysphagia, unspecified: Secondary | ICD-10-CM | POA: Diagnosis not present

## 2021-09-20 DIAGNOSIS — S06351A Traumatic hemorrhage of left cerebrum with loss of consciousness of 30 minutes or less, initial encounter: Secondary | ICD-10-CM | POA: Diagnosis not present

## 2021-09-20 DIAGNOSIS — E669 Obesity, unspecified: Secondary | ICD-10-CM | POA: Diagnosis present

## 2021-09-20 DIAGNOSIS — S199XXA Unspecified injury of neck, initial encounter: Secondary | ICD-10-CM | POA: Diagnosis not present

## 2021-09-20 DIAGNOSIS — R739 Hyperglycemia, unspecified: Secondary | ICD-10-CM | POA: Diagnosis not present

## 2021-09-20 DIAGNOSIS — R6521 Severe sepsis with septic shock: Secondary | ICD-10-CM | POA: Diagnosis not present

## 2021-09-20 DIAGNOSIS — D6489 Other specified anemias: Secondary | ICD-10-CM | POA: Diagnosis not present

## 2021-09-20 DIAGNOSIS — I130 Hypertensive heart and chronic kidney disease with heart failure and stage 1 through stage 4 chronic kidney disease, or unspecified chronic kidney disease: Secondary | ICD-10-CM | POA: Diagnosis present

## 2021-09-20 DIAGNOSIS — J9601 Acute respiratory failure with hypoxia: Secondary | ICD-10-CM

## 2021-09-20 DIAGNOSIS — Z6830 Body mass index (BMI) 30.0-30.9, adult: Secondary | ICD-10-CM | POA: Diagnosis not present

## 2021-09-20 DIAGNOSIS — Y95 Nosocomial condition: Secondary | ICD-10-CM | POA: Diagnosis not present

## 2021-09-20 DIAGNOSIS — J9600 Acute respiratory failure, unspecified whether with hypoxia or hypercapnia: Secondary | ICD-10-CM

## 2021-09-20 DIAGNOSIS — E1165 Type 2 diabetes mellitus with hyperglycemia: Secondary | ICD-10-CM | POA: Diagnosis not present

## 2021-09-20 DIAGNOSIS — I5042 Chronic combined systolic (congestive) and diastolic (congestive) heart failure: Secondary | ICD-10-CM | POA: Diagnosis present

## 2021-09-20 DIAGNOSIS — E876 Hypokalemia: Secondary | ICD-10-CM | POA: Diagnosis not present

## 2021-09-20 DIAGNOSIS — I629 Nontraumatic intracranial hemorrhage, unspecified: Secondary | ICD-10-CM | POA: Diagnosis not present

## 2021-09-20 DIAGNOSIS — I452 Bifascicular block: Secondary | ICD-10-CM | POA: Diagnosis present

## 2021-09-20 DIAGNOSIS — K219 Gastro-esophageal reflux disease without esophagitis: Secondary | ICD-10-CM | POA: Diagnosis present

## 2021-09-20 DIAGNOSIS — E1122 Type 2 diabetes mellitus with diabetic chronic kidney disease: Secondary | ICD-10-CM | POA: Diagnosis present

## 2021-09-20 DIAGNOSIS — I442 Atrioventricular block, complete: Secondary | ICD-10-CM | POA: Diagnosis not present

## 2021-09-20 DIAGNOSIS — I472 Ventricular tachycardia, unspecified: Secondary | ICD-10-CM | POA: Diagnosis not present

## 2021-09-20 DIAGNOSIS — Z4682 Encounter for fitting and adjustment of non-vascular catheter: Secondary | ICD-10-CM | POA: Diagnosis not present

## 2021-09-20 DIAGNOSIS — R54 Age-related physical debility: Secondary | ICD-10-CM | POA: Diagnosis present

## 2021-09-20 DIAGNOSIS — G8191 Hemiplegia, unspecified affecting right dominant side: Secondary | ICD-10-CM | POA: Diagnosis not present

## 2021-09-20 DIAGNOSIS — Z8673 Personal history of transient ischemic attack (TIA), and cerebral infarction without residual deficits: Secondary | ICD-10-CM | POA: Diagnosis not present

## 2021-09-20 DIAGNOSIS — R29810 Facial weakness: Secondary | ICD-10-CM | POA: Diagnosis not present

## 2021-09-20 DIAGNOSIS — G9389 Other specified disorders of brain: Secondary | ICD-10-CM | POA: Diagnosis not present

## 2021-09-20 DIAGNOSIS — I619 Nontraumatic intracerebral hemorrhage, unspecified: Secondary | ICD-10-CM | POA: Insufficient documentation

## 2021-09-20 DIAGNOSIS — J189 Pneumonia, unspecified organism: Secondary | ICD-10-CM | POA: Diagnosis not present

## 2021-09-20 DIAGNOSIS — J90 Pleural effusion, not elsewhere classified: Secondary | ICD-10-CM | POA: Diagnosis not present

## 2021-09-20 DIAGNOSIS — Z20822 Contact with and (suspected) exposure to covid-19: Secondary | ICD-10-CM | POA: Diagnosis present

## 2021-09-20 DIAGNOSIS — E785 Hyperlipidemia, unspecified: Secondary | ICD-10-CM | POA: Diagnosis present

## 2021-09-20 DIAGNOSIS — Z4659 Encounter for fitting and adjustment of other gastrointestinal appliance and device: Secondary | ICD-10-CM

## 2021-09-20 DIAGNOSIS — I2510 Atherosclerotic heart disease of native coronary artery without angina pectoris: Secondary | ICD-10-CM | POA: Diagnosis present

## 2021-09-20 DIAGNOSIS — R0603 Acute respiratory distress: Secondary | ICD-10-CM | POA: Diagnosis not present

## 2021-09-20 DIAGNOSIS — I495 Sick sinus syndrome: Secondary | ICD-10-CM

## 2021-09-20 DIAGNOSIS — Z978 Presence of other specified devices: Secondary | ICD-10-CM

## 2021-09-20 DIAGNOSIS — T45515A Adverse effect of anticoagulants, initial encounter: Secondary | ICD-10-CM | POA: Diagnosis not present

## 2021-09-20 DIAGNOSIS — G9341 Metabolic encephalopathy: Secondary | ICD-10-CM | POA: Diagnosis not present

## 2021-09-20 DIAGNOSIS — G936 Cerebral edema: Secondary | ICD-10-CM | POA: Diagnosis not present

## 2021-09-20 DIAGNOSIS — I469 Cardiac arrest, cause unspecified: Secondary | ICD-10-CM | POA: Diagnosis not present

## 2021-09-20 DIAGNOSIS — R42 Dizziness and giddiness: Secondary | ICD-10-CM | POA: Diagnosis not present

## 2021-09-20 DIAGNOSIS — N170 Acute kidney failure with tubular necrosis: Secondary | ICD-10-CM | POA: Diagnosis not present

## 2021-09-20 DIAGNOSIS — I639 Cerebral infarction, unspecified: Secondary | ICD-10-CM | POA: Diagnosis not present

## 2021-09-20 DIAGNOSIS — J984 Other disorders of lung: Secondary | ICD-10-CM | POA: Diagnosis not present

## 2021-09-20 DIAGNOSIS — R471 Dysarthria and anarthria: Secondary | ICD-10-CM | POA: Diagnosis not present

## 2021-09-20 DIAGNOSIS — Z8546 Personal history of malignant neoplasm of prostate: Secondary | ICD-10-CM

## 2021-09-20 DIAGNOSIS — I498 Other specified cardiac arrhythmias: Secondary | ICD-10-CM | POA: Diagnosis not present

## 2021-09-20 LAB — COMPREHENSIVE METABOLIC PANEL
ALT: 36 U/L (ref 0–44)
AST: 35 U/L (ref 15–41)
Albumin: 3.4 g/dL — ABNORMAL LOW (ref 3.5–5.0)
Alkaline Phosphatase: 90 U/L (ref 38–126)
Anion gap: 8 (ref 5–15)
BUN: 38 mg/dL — ABNORMAL HIGH (ref 8–23)
CO2: 19 mmol/L — ABNORMAL LOW (ref 22–32)
Calcium: 8.4 mg/dL — ABNORMAL LOW (ref 8.9–10.3)
Chloride: 110 mmol/L (ref 98–111)
Creatinine, Ser: 2.18 mg/dL — ABNORMAL HIGH (ref 0.61–1.24)
GFR, Estimated: 29 mL/min — ABNORMAL LOW (ref >60)
Glucose, Bld: 331 mg/dL — ABNORMAL HIGH (ref 70–99)
Potassium: 3.6 mmol/L (ref 3.5–5.1)
Sodium: 137 mmol/L (ref 135–145)
Total Bilirubin: 0.7 mg/dL (ref 0.3–1.2)
Total Protein: 6.2 g/dL — ABNORMAL LOW (ref 6.5–8.1)

## 2021-09-20 LAB — LACTIC ACID, PLASMA: Lactic Acid, Venous: 1.6 mmol/L (ref 0.5–1.9)

## 2021-09-20 LAB — CBC WITH DIFFERENTIAL/PLATELET
Abs Immature Granulocytes: 0 10*3/uL (ref 0.00–0.07)
Basophils Absolute: 0.4 10*3/uL — ABNORMAL HIGH (ref 0.0–0.1)
Basophils Relative: 3 %
Eosinophils Absolute: 0.4 10*3/uL (ref 0.0–0.5)
Eosinophils Relative: 3 %
HCT: 32.4 % — ABNORMAL LOW (ref 39.0–52.0)
Hemoglobin: 10.6 g/dL — ABNORMAL LOW (ref 13.0–17.0)
Lymphocytes Relative: 34 %
Lymphs Abs: 5 10*3/uL — ABNORMAL HIGH (ref 0.7–4.0)
MCH: 30.3 pg (ref 26.0–34.0)
MCHC: 32.7 g/dL (ref 30.0–36.0)
MCV: 92.6 fL (ref 80.0–100.0)
Monocytes Absolute: 0.6 10*3/uL (ref 0.1–1.0)
Monocytes Relative: 4 %
Neutro Abs: 8.2 10*3/uL — ABNORMAL HIGH (ref 1.7–7.7)
Neutrophils Relative %: 56 %
Platelets: 198 10*3/uL (ref 150–400)
RBC: 3.5 MIL/uL — ABNORMAL LOW (ref 4.22–5.81)
RDW: 14.1 % (ref 11.5–15.5)
WBC: 14.6 10*3/uL — ABNORMAL HIGH (ref 4.0–10.5)
nRBC: 0 % (ref 0.0–0.2)
nRBC: 1 /100 WBC — ABNORMAL HIGH

## 2021-09-20 LAB — HEMOGLOBIN A1C
Hgb A1c MFr Bld: 8.3 % — ABNORMAL HIGH (ref 4.8–5.6)
Mean Plasma Glucose: 191.51 mg/dL

## 2021-09-20 LAB — TROPONIN I (HIGH SENSITIVITY)
Troponin I (High Sensitivity): 25 ng/L — ABNORMAL HIGH (ref <18)
Troponin I (High Sensitivity): 481 ng/L (ref <18)

## 2021-09-20 LAB — I-STAT ARTERIAL BLOOD GAS, ED
Acid-base deficit: 8 mmol/L — ABNORMAL HIGH (ref 0.0–2.0)
Bicarbonate: 17.8 mmol/L — ABNORMAL LOW (ref 20.0–28.0)
Calcium, Ion: 1.19 mmol/L (ref 1.15–1.40)
HCT: 28 % — ABNORMAL LOW (ref 39.0–52.0)
Hemoglobin: 9.5 g/dL — ABNORMAL LOW (ref 13.0–17.0)
O2 Saturation: 100 %
Potassium: 3.5 mmol/L (ref 3.5–5.1)
Sodium: 140 mmol/L (ref 135–145)
TCO2: 19 mmol/L — ABNORMAL LOW (ref 22–32)
pCO2 arterial: 36.8 mmHg (ref 32.0–48.0)
pH, Arterial: 7.294 — ABNORMAL LOW (ref 7.350–7.450)
pO2, Arterial: 352 mmHg — ABNORMAL HIGH (ref 83.0–108.0)

## 2021-09-20 LAB — I-STAT CHEM 8, ED
BUN: 37 mg/dL — ABNORMAL HIGH (ref 8–23)
Calcium, Ion: 1.23 mmol/L (ref 1.15–1.40)
Chloride: 110 mmol/L (ref 98–111)
Creatinine, Ser: 2.2 mg/dL — ABNORMAL HIGH (ref 0.61–1.24)
Glucose, Bld: 312 mg/dL — ABNORMAL HIGH (ref 70–99)
HCT: 30 % — ABNORMAL LOW (ref 39.0–52.0)
Hemoglobin: 10.2 g/dL — ABNORMAL LOW (ref 13.0–17.0)
Potassium: 3.7 mmol/L (ref 3.5–5.1)
Sodium: 140 mmol/L (ref 135–145)
TCO2: 20 mmol/L — ABNORMAL LOW (ref 22–32)

## 2021-09-20 LAB — T4, FREE: Free T4: 0.78 ng/dL (ref 0.61–1.12)

## 2021-09-20 LAB — RESP PANEL BY RT-PCR (FLU A&B, COVID) ARPGX2
Influenza A by PCR: NEGATIVE
Influenza B by PCR: NEGATIVE
SARS Coronavirus 2 by RT PCR: NEGATIVE

## 2021-09-20 LAB — GLUCOSE, CAPILLARY
Glucose-Capillary: 279 mg/dL — ABNORMAL HIGH (ref 70–99)
Glucose-Capillary: 311 mg/dL — ABNORMAL HIGH (ref 70–99)

## 2021-09-20 LAB — TSH: TSH: 3.723 u[IU]/mL (ref 0.350–4.500)

## 2021-09-20 LAB — MRSA NEXT GEN BY PCR, NASAL: MRSA by PCR Next Gen: NOT DETECTED

## 2021-09-20 MED ORDER — ROCURONIUM BROMIDE 50 MG/5ML IV SOLN
INTRAVENOUS | Status: DC | PRN
  Administered 2021-09-20: 100 mg via INTRAVENOUS

## 2021-09-20 MED ORDER — FENTANYL BOLUS VIA INFUSION
25.0000 ug | INTRAVENOUS | Status: DC | PRN
Start: 2021-09-20 — End: 2021-09-23
  Administered 2021-09-20: 18:00:00 25 ug via INTRAVENOUS
  Filled 2021-09-20: qty 100

## 2021-09-20 MED ORDER — INSULIN ASPART 100 UNIT/ML IJ SOLN
0.0000 [IU] | INTRAMUSCULAR | Status: DC
  Administered 2021-09-21: 04:00:00 3 [IU] via SUBCUTANEOUS
  Administered 2021-09-21: 17:00:00 2 [IU] via SUBCUTANEOUS
  Administered 2021-09-21: 8 [IU] via SUBCUTANEOUS
  Administered 2021-09-21 (×3): 2 [IU] via SUBCUTANEOUS
  Administered 2021-09-22 (×2): 3 [IU] via SUBCUTANEOUS
  Administered 2021-09-22 (×2): 2 [IU] via SUBCUTANEOUS
  Administered 2021-09-22 (×2): 3 [IU] via SUBCUTANEOUS
  Administered 2021-09-23: 13:00:00 5 [IU] via SUBCUTANEOUS
  Administered 2021-09-23: 05:00:00 2 [IU] via SUBCUTANEOUS
  Administered 2021-09-23: 16:00:00 5 [IU] via SUBCUTANEOUS
  Administered 2021-09-23: 08:00:00 3 [IU] via SUBCUTANEOUS
  Administered 2021-09-23: 21:00:00 5 [IU] via SUBCUTANEOUS
  Administered 2021-09-24 (×2): 3 [IU] via SUBCUTANEOUS
  Administered 2021-09-24: 5 [IU] via SUBCUTANEOUS
  Administered 2021-09-24: 13:00:00 2 [IU] via SUBCUTANEOUS
  Administered 2021-09-24: 21:00:00 5 [IU] via SUBCUTANEOUS
  Administered 2021-09-24: 09:00:00 2 [IU] via SUBCUTANEOUS
  Administered 2021-09-25: 04:00:00 5 [IU] via SUBCUTANEOUS
  Administered 2021-09-25: 20:00:00 15 [IU] via SUBCUTANEOUS
  Administered 2021-09-25 (×2): 5 [IU] via SUBCUTANEOUS
  Administered 2021-09-25: 08:00:00 3 [IU] via SUBCUTANEOUS
  Administered 2021-09-25: 17:00:00 11 [IU] via SUBCUTANEOUS
  Administered 2021-09-26 (×2): 8 [IU] via SUBCUTANEOUS

## 2021-09-20 MED ORDER — FENTANYL 2500MCG IN NS 250ML (10MCG/ML) PREMIX INFUSION
25.0000 ug/h | INTRAVENOUS | Status: DC
  Administered 2021-09-20 – 2021-09-23 (×2): 50 ug/h via INTRAVENOUS
  Filled 2021-09-20 (×2): qty 250

## 2021-09-20 MED ORDER — POLYETHYLENE GLYCOL 3350 17 G PO PACK
17.0000 g | PACK | Freq: Every day | ORAL | Status: DC
  Administered 2021-09-21 – 2021-09-25 (×3): 17 g
  Filled 2021-09-20 (×3): qty 1

## 2021-09-20 MED ORDER — ACETAMINOPHEN 160 MG/5ML PO SOLN
650.0000 mg | ORAL | Status: DC
  Administered 2021-09-21 – 2021-09-25 (×14): 650 mg
  Filled 2021-09-20 (×12): qty 20.3

## 2021-09-20 MED ORDER — DOCUSATE SODIUM 50 MG/5ML PO LIQD
100.0000 mg | Freq: Two times a day (BID) | ORAL | Status: DC
  Administered 2021-09-21 – 2021-09-25 (×8): 100 mg
  Filled 2021-09-20 (×8): qty 10

## 2021-09-20 MED ORDER — FENTANYL CITRATE PF 50 MCG/ML IJ SOSY
25.0000 ug | PREFILLED_SYRINGE | Freq: Once | INTRAMUSCULAR | Status: DC
Start: 2021-09-20 — End: 2021-09-25

## 2021-09-20 MED ORDER — ACETAMINOPHEN 650 MG RE SUPP
650.0000 mg | RECTAL | Status: DC
  Administered 2021-09-22 (×2): 650 mg via RECTAL
  Filled 2021-09-20: qty 1

## 2021-09-20 MED ORDER — SODIUM CHLORIDE 0.9 % IV SOLN
250.0000 mL | INTRAVENOUS | Status: DC
  Administered 2021-09-20: 22:00:00 1000 mL via INTRAVENOUS

## 2021-09-20 MED ORDER — CHLORHEXIDINE GLUCONATE 0.12% ORAL RINSE (MEDLINE KIT)
15.0000 mL | Freq: Two times a day (BID) | OROMUCOSAL | Status: DC
  Administered 2021-09-20 – 2021-09-21 (×3): 15 mL via OROMUCOSAL

## 2021-09-20 MED ORDER — ONDANSETRON HCL 4 MG/2ML IJ SOLN
4.0000 mg | Freq: Four times a day (QID) | INTRAMUSCULAR | Status: DC | PRN
  Administered 2021-09-21: 22:00:00 4 mg via INTRAVENOUS
  Filled 2021-09-20: qty 2

## 2021-09-20 MED ORDER — CHLORHEXIDINE GLUCONATE CLOTH 2 % EX PADS
6.0000 | MEDICATED_PAD | Freq: Every day | CUTANEOUS | Status: DC
  Administered 2021-09-20 – 2021-09-21 (×2): 6 via TOPICAL

## 2021-09-20 MED ORDER — ORAL CARE MOUTH RINSE
15.0000 mL | OROMUCOSAL | Status: DC
  Administered 2021-09-21 (×7): 15 mL via OROMUCOSAL

## 2021-09-20 MED ORDER — ETOMIDATE 2 MG/ML IV SOLN
INTRAVENOUS | Status: DC | PRN
  Administered 2021-09-20: 20 mg via INTRAVENOUS

## 2021-09-20 MED ORDER — ACETAMINOPHEN 325 MG PO TABS
650.0000 mg | ORAL_TABLET | ORAL | Status: DC
  Administered 2021-09-22: 17:00:00 650 mg via ORAL
  Filled 2021-09-20: qty 2

## 2021-09-20 MED ORDER — PANTOPRAZOLE SODIUM 40 MG IV SOLR
40.0000 mg | Freq: Every day | INTRAVENOUS | Status: DC
  Administered 2021-09-20: 23:00:00 40 mg via INTRAVENOUS
  Filled 2021-09-20: qty 40

## 2021-09-20 MED ORDER — EPINEPHRINE HCL 5 MG/250ML IV SOLN IN NS
0.5000 ug/min | INTRAVENOUS | Status: DC
  Administered 2021-09-23: 5 ug/min via INTRAVENOUS
  Filled 2021-09-20: qty 250

## 2021-09-20 NOTE — Progress Notes (Signed)
eLink Physician-Brief Progress Note Patient Name: Aaron Carlson DOB: 05/02/1933 MRN: [id number]   Date of Service  09/10/2021  HPI/Events of Note  87 yr old man with syncope, cardiac arrest and transient heart block. Intubated and following all commands, moving all extremities and HR is in 90s on no pressors, and on 100 mic/hour fentanyl drip. EEG not being done since patient with no seizures and resolved confusion. CT head / spine with nothing acute.   eICU Interventions  Seen by CCM and cardiology D/w RN Is on PRVC VT 540, RR 20, peep 5, fio2 40 Plan per CCM team Call E link if needed     Intervention Category Major Interventions: Respiratory failure - evaluation and management;Arrhythmia - evaluation and management Evaluation Type: New Patient Evaluation  Margaretmary Lombard 09/22/2021, 9:59 PM

## 2021-09-20 NOTE — H&P (Addendum)
NAME:  Aaron Carlson, MRN:  [id number], DOB:  04/14/1933, LOS: 0 ADMISSION DATE:  09/22/2021, CONSULTATION DATE:  12/24 REFERRING MD:  Aaron Carlson, CHIEF COMPLAINT:  syncope   09/17/2021 APP Aaron Carlson. I saw and evaluated the patient. Discussed with APP and agree with their findings and plan as documented below.  I have seen and evaluated the patient s/p out of hospital cardiac arrest  S:  26M with PMHx significant for diffuse CAD medically treated with stable angina, HFpEF, CKD3b, and medical frailty who is admitted post OOHCA. Witnessed syncope at home. On EMS arrival high grade AV block with HR of 30. Required transcutaneous pacing, patient ripped off pads and became pulseless requiring CPR x3 rounds and epi x1.  Wife is at bedside. She reports he was in his usual state of health with no complaints leading up to this incident.   O: Blood pressure (!) 160/96, pulse (!) 103, resp. rate (!) 21, height $RemoveBe'5\' 8"'pfReUKEhq$  (1.727 m), SpO2 99 %.   Exam: Gen:       Intubated and sedated on fentnyl gtt. No obvious distress HEENT:    anicteric sclera. Arcus senilis. Moist oral mucosa  Lungs:    Clear to auscultation bilaterally; minimal ventilator support with normal plateau pressures CV:         tachycardic, regular. Cap refill < 2 seconds. 1+ pedal edema Abd:      Nondistended, no palpable organomegaly Ext:    No cyanosis or clubbbing Skin:       Warm and dry; no rash on exposed skin Neuro:    pupils equal in size, appropriately reactive to light. Sticks out tongue, wiggles feet to command. Does not move upper extremities to command but does withdraw from noxious stimuli.  A/P:   #Out of Hospital Cardiac Arrest --Presumed etiology: cardiac in setting of demonstrated high grade AV block that has since resolved --Time down: 6 minutes with EMS --CPR: 3 cycles, 1 of epi --Shockable rhythm reported: No --Initial neuro exam notable for: moving lower extremities to command, sticks out tongue, regards  speaker, does not move upper extremities to command --POCUS notable for: global hypokinesis, disproportionately affecting the inferior wall Management Plan: Targeted Temperature Management Protocol: goal normothermia (<37.5oC) x 72 hours Diagnostic studies ordered: TTE, cycle troponin/EKGs Hemodynamic goal: Maintain MAP > 65 mmHg Respiratory management: Lung protective ventilation, SpO2 goal > 92%, Avoid hyperoxia, PaCO2 goal 35-50 Uptrending troponin from 25-->481. Known history of ischemic disease treated medically. Start ASA/statin. Will discuss anticoagulation with cardiology consultant  #Third degree AV block --transient. Currently in sinus tach with IVCD Management plan: Cardiology following. Plan for EP consult in AM for PPM eval Monitor electrolytes, K>4, Mg>2, Check TSH Hold AV nodal blockers External pacer pads to remain on chest, Atropine PRN  #Acute Hypoxic Respiratory Failure --secondary HY:IFOYD attributed to complete AV block   --On admission required Mechanical ventilation on PRVC FiO2 40%, PEEP5, TV 540 (8cc/kg IBW) with hyperoxia noted --Plan: continue supportive care. SAT/SBT in AM.   #Acute encephalopathy --metabolic in setting of arrest. CTH without evidence of traumatic hemorrhage, neuro exam reassuring and not consistent with seizure at this time. Supportive care as detailed.  #Type 2 diabetes with hyperglycemia --A1C 8.3%, glucose 300s at present. Start SSI q4hrs. May need long acting insulin as well. Will follow up overnight. Home med list with ?38U of NPH nightly. Goal glucose < 180  #Sedation plan for a mechanically ventilated patient --RASS goal: 0 to -1, patient is at goal --  Medications ordered: fentanyl pushes PRN and fentanyl gtt  All other plans as detailed in the APP's note.  Patient critically ill due to cardiac arrest, transient high degree AV block Interventions to address this today serial neurologic exams, continued support with mechanical  ventilation, telemetry monitoring Risk of deterioration without these interventions is high  I personally spent 42 minutes providing critical care not including any separately billable procedures   Aaron Carlson   History of Present Illness:  Patient is encephalopathic and/or intubated. Therefore history has been obtained from chart review.   Aaron Carlson, is a 87 y.o. male, who presented to the [facility] ED with a chief complaint of syncope  They have a pertinent past medical history of CAD, combined systolic and diastolic HF, HTN, HLD, DM, CKD 3/4, GERD  Per documentation, patient had witnessed syncopal episode while at home, hit head on ground, reported unresponsive for 1 min. On arrival EMS found patient in complete heart block with rate of 30. Patient reported oriented x4. Atropine given, pacing initiated, $RemoveBeforeDEI'5mg'KaBRrUtXqRhIdCRS$  versed given. Per EDP patient removed pacing pads, HR decreased, and then became pulseless. CPR initiated with 3 rounds of CPR and 1 epi  He was intubated in the ED. Cardiology was consulted. CT head is pending. Trop 25. ECG: ST, RBBB, IVCD ,7.29/36/352.17.2. CXR with no infiltrate, pneumo, or effusion.   PCCM was consulted for admission.   Pertinent  Medical History  CAD, combined systolic and diastolic HF, HTN, HLD, DM, CKD 3/4, GERD  Significant Hospital Events: Including procedures, antibiotic start and stop dates in addition to other pertinent events   12/24 Syncope, EMS called, 3rd degree HB, paced, removed pads, asystole, CPR, intubated in ED. CT head>  Interim History / Subjective:  See above  100 fentanyl  Unable to obtain subjective evaluation due to patient status  Objective   Blood pressure (!) 167/91, pulse (!) 104, resp. rate 20, height $RemoveBe'5\' 8"'RjbxIQJjU$  (1.727 m), SpO2 100 %.    Vent Mode: PRVC FiO2 (%):  [100 %] 100 % Set Rate:  [20 bmp] 20 bmp Vt Set:  [540 mL] 540 mL PEEP:  [5 cmH20] 5 cmH20 Plateau Pressure:  [21 cmH20] 21  cmH20  No intake or output data in the 24 hours ending 09/11/2021 1927 There were no vitals filed for this visit.  Examination: General: In bed, NAD, frail appearing HEENT: MM pink/moist, anicteric, atraumatic Neuro: RASS -1, PERRL 56mm, follows commands with toe wiggles and sticks tongue out. CV: S1S2, ST, no m/r/g appreciated PULM:  clear in the upper lobes, clear in the lower lobes, trachea midline, chest expansion symmetric GI: soft, bsx4 hypoactive, non-tender   Extremities: warm/dry, no pretibial edema, capillary refill less than 3 seconds  Skin:  no rashes or lesions noted  WBC 14.6 HGB 10.6 BG 331 CO2 19 Trop 25 7.29/36/352/17.2  ECG: ST, RBBB, IVCD CXR with no infiltrate, pneumo, or effusion.  Resolved Hospital Problem list     Assessment & Plan:  Cardiac arrest- asystole,Bradycardic with EMS, paced, 3 rounds CPR with 1 epi. Third degree heart block- presented on admission  Syncope- suspect secondary to arrhythmia vs hypoperfusion NAGMA- suspect secondary to chronic renal disease Initial troponin 25, CXR with no infiltrate, pneumo, or effusion. Diffuse global hypokinesis on POCUS. Family denies sick contacts, in normal state of health today, suspect WBC 14.6 reactive after CPR.  -Cardiology consulted in ED. Appreciate assistance. Workup per cardiology.  -Admit to ICU -EP consult planned in AM for eval  of permanent pacemaker. -Hold AV nodal blockers, continue pacer pads on chest, atropine at bedside. -IV epi if continued episodes of bradycardia occur.  -Admit to ICU with continuous tele and SPO2 monitoring -Goal MAP greater than 65. Currently normotensive.  -Goal normothermia. Target temperature between 36 and 37.8 degrees celsius. Notify provider if temperature goals are not met. Place esophageal or bladder temperature probe. -Follow up CMP, MG, Phos, CBC, INR -Start PRN tylenol $RemoveBef'650mg'RQctWvqsIK$  q6h per tube or PR. -Trend troponin, lactate, and EKG -Strict I&O -Goal K above 4,  goal MG above 2 -AC plan per cardiology. Head CT pending. -Follow WBC/fever curve  Acute Metabolic Encephalopathy Secondary to cardiac arrest. Following commands in ED. -Obtain spot EEG.  -Obtain head CT. -PAD bundle as discussed below. Goal RASS 0. Titrate medication to goal. -Frequent neuro exams -Continue neuroprotective measures: goal normothermia, euglycemia, HOB greater than 30 when able, head in neutral alignment, normocapnia, normoxia, normonatremia.    Acute respiratory failure with hypoxia Secondary to cardiac arrest. Documented SPO2 of 83%. -LTVV strategy with tidal volumes of 4-8 cc/kg ideal body weight -Goal plateau pressures less than 30 and driving pressures less than 15 -Wean PEEP/FiO2 for SpO2 92-98% -VAP bundle -Daily SAT and SBT -PAD bundle with fentanyl gtt -RASS goal 0 to -1 -Follow intermittent CXR and ABG PRN  HX CAD- seen at [facility], managed medical[facility]  Hx systolic and diastolic CHF HX HLD  HX HTN -Continue tele -Continue atorvastatin -Continue niacin -Trend troponin and ECG -Holding home antihypertensives at this time.  Syncope/Fall -fall precautions  DM2 BG 331 -Blood Glucose goal 140-180. -SSI -Q4h CBG  CKD3-4 Creat 2.18, 2.3 8 months ago -Ensure renal perfusion. Goal MAP 65 or greater. -Avoid neprotoxic drugs as possible. -Strict I&O's -Follow up AM creatinine  GERD -PPI  Best Practice (right click and "Reselect all SmartList Selections" daily)   Diet/type: NPO w/ meds via tube DVT prophylaxis: SCD, plan pending GI prophylaxis: PPI Lines: N/A Foley:  Yes, and it is still needed Code Status:  full code Last date of multidisciplinary goals of care discussion [Full scope, discussed with Everlena Mcglinn at bedside on 12/24 ]  Labs   CBC: Recent Labs  Lab 09/14/2021 1806 09/15/2021 1823 09/18/2021 1923  WBC 14.6*  --   --   NEUTROABS 8.2*  --   --   HGB 10.6* 10.2* 9.5*  HCT 32.4* 30.0* 28.0*  MCV 92.6  --   --   PLT 198  --   --      Basic Metabolic Panel: Recent Labs  Lab 09/12/2021 1806 09/14/2021 1823 09/08/2021 1923  NA 137 140 140  K 3.6 3.7 3.5  CL 110 110  --   CO2 19*  --   --   GLUCOSE 331* 312*  --   BUN 38* 37*  --   CREATININE 2.18* 2.20*  --   CALCIUM 8.4*  --   --    GFR: CrCl cannot be calculated (Unknown ideal weight.). Recent Labs  Lab 09/19/2021 1806  WBC 14.6*    Liver Function Tests: Recent Labs  Lab 09/25/2021 1806  AST 35  ALT 36  ALKPHOS 90  BILITOT 0.7  PROT 6.2*  ALBUMIN 3.4*   No results for input(s): LIPASE, AMYLASE in the last 168 hours. No results for input(s): AMMONIA in the last 168 hours.  ABG    Component Value Date/Time   PHART 7.294 (L) 09/04/2021 1923   PCO2ART 36.8 09/16/2021 1923   PO2ART 352 (H) 09/10/2021  1923   HCO3 17.8 (L) 09/25/2021 1923   TCO2 19 (L) 09/21/2021 1923   ACIDBASEDEF 8.0 (H) 09/04/2021 1923   O2SAT 100.0 09/23/2021 1923     Coagulation Profile: No results for input(s): INR, PROTIME in the last 168 hours.  Cardiac Enzymes: No results for input(s): CKTOTAL, CKMB, CKMBINDEX, TROPONINI in the last 168 hours.  HbA1C: Hgb A1c MFr Bld  Date/Time Value Ref Range Status  11/04/2006 10:39 AM 7.5 (H) 4.6 - 6.0 % Final    Comment:    See lab report for associated comment(s)  09/02/2006 08:03 AM 7.3 (H) 4.6 - 6.0 % Final    Comment:    See lab report for associated comment(s)    CBG: No results for input(s): GLUCAP in the last 168 hours.  Review of Systems:   Unable to obtain ROS due to patient status.  Past Medical History:  He,  has a past medical history of CAD in native artery (08/28/2013), Chronic renal insufficiency, Colon polyps, Diabetes mellitus, Hyperlipemia, Hypertension, Laryngeal papillomatosis, and Prostate cancer ([place]).   Surgical History:   Past Surgical History:  Procedure Laterality Date   BLADDER SURGERY     CATARACT EXTRACTION Left 02/2020   CIRCUMCISION  2011   POLYPECTOMY     vocal cords   PROSTATE  SURGERY       Social History:   reports that he has quit smoking. He has never used smokeless tobacco. He reports current alcohol use. He reports that he does not use drugs.   Family History:  His family history includes Breast cancer in his mother; Diabetes in his mother; Heart disease in his father; Heart failure in his father; Pancreatic cancer in his brother. There is no history of Colon cancer, Esophageal cancer, or Liver disease.   Allergies Allergies  Allergen Reactions   Penicillins Rash    Other reaction(s): unsure     Home Medications  Prior to Admission medications   Medication Sig Start Date End Date Taking? Authorizing Provider  Accu-Chek Softclix Lancets lancets  05/25/20   [provider]  Alcohol Swabs 70 % PADS use when checking blood sugar 03/15/20   [provider]  atorvastatin (LIPITOR) 20 MG tablet 1 tablet    [provider]  BD INSULIN SYRINGE ULTRAFINE 31G X 5/16" 0.5 ML MISC Use as directed 10/10/13   [provider]  benazepril (LOTENSIN) 20 MG tablet TAKE 1 TABLET EVERY DAY 07/25/21   Belva Crome, MD  Blood Glucose Monitoring Suppl (TRUE METRIX METER) w/Device KIT  05/16/20   [provider]  brimonidine (ALPHAGAN) 0.2 % ophthalmic solution 1 drop 2 (two) times daily. 07/29/21   [provider]  calcium carbonate (TUMS - DOSED IN MG ELEMENTAL CALCIUM) 500 MG chewable tablet Chew 1 tablet by mouth daily as needed for indigestion or heartburn.    [provider]  Continuous Blood Gluc Receiver (FREESTYLE LIBRE 2 READER) DEVI See admin instructions. 07/21/21   [provider]  Continuous Blood Gluc Sensor (FREESTYLE LIBRE 2 SENSOR) MISC apply to upper back of arm 07/21/21   [provider]  fluorometholone (FML) 0.1 % ophthalmic suspension 1 drop into affected eye    [provider]  fluticasone (FLONASE) 50 MCG/ACT nasal spray Place into both nostrils daily as needed for  allergies or rhinitis.    [provider]  furosemide (LASIX) 80 MG tablet Take 80 mg by mouth daily.    [provider]  Garlic 572 MG  TABS Take 1 tablet by mouth daily.    [provider]  insulin lispro (HUMALOG) 100 UNIT/ML injection Use as directed daily at bedtime as directed per sliding scale.    [provider]  insulin NPH Human (HUMULIN N,NOVOLIN N) 100 UNIT/ML injection Inject 38 Units into the skin at bedtime.     [provider]  Iron Combinations (CHROMAGEN) capsule Take 1 capsule by mouth daily.    [provider]  isosorbide mononitrate (IMDUR) 60 MG 24 hr tablet Take 1 tablet (60 mg total) by mouth daily. 07/10/21   Belva Crome, MD  ketorolac (ACULAR) 0.5 % ophthalmic solution 1 drop into affected eye  as needed    [provider]  latanoprost (XALATAN) 0.005 % ophthalmic solution Place 1 drop into both eyes at bedtime.    [provider]  Latanoprostene Bunod (VYZULTA) 0.024 % SOLN 1 drop into affected eye in the evening    [provider]  MAGNESIUM PO Take 1 tablet by mouth daily.    [provider]  metoprolol succinate (TOPROL XL) 25 MG 24 hr tablet Take 1 tablet (25 mg total) by mouth daily. 06/11/21   Belva Crome, MD  Multiple Vitamin (MULTIVITAMIN) tablet Take 1 tablet by mouth daily.    [provider]  niacin (NIASPAN) 500 MG CR tablet Take 500 mg by mouth at bedtime.    [provider]  niacin (SLO-NIACIN) 500 MG tablet 1 tablet with food    [provider]  nitroGLYCERIN (NITROSTAT) 0.4 MG SL tablet Place 1 tablet (0.4 mg total) under the tongue every 5 (five) minutes as needed for chest pain. 12/09/20 03/09/21  Belva Crome, MD  Omega-3 Fatty Acids (FISH OIL) 1000 MG CAPS 1 capsule    [provider]  ONE TOUCH ULTRA TEST test strip Use as directed 08/21/13   [provider]  pantoprazole (PROTONIX) 40 MG tablet  07/01/21    [provider]  Peppermint Oil (IBGARD) 90 MG CPCR 3-4 capsules    [provider]  potassium chloride (KLOR-CON) 20 MEQ packet Take 20 mEq by mouth daily.    [provider]  Probiotic TBEC See admin instructions.    [provider]  simethicone (MYLICON) 734 MG chewable tablet Chew 125 mg by mouth every 6 (six) hours as needed for flatulence.    [provider]  Jay Schlichter Oil (ARTIFICIAL TEARS) ointment Place into the left eye in the morning, at noon, and at bedtime.    [provider]     Critical care time: 51 minutes    [employer]., MSN, APRN, AGACNP-BC [facility]  09/25/2021 , 7:27 PM  Please see [email or web address] for pager details  If no response, please call [phone] After hours, please call Elink at [phone]

## 2021-09-20 NOTE — Code Documentation (Addendum)
Pt now being paced, rate 70.  Capture at 94, mechanical and electrical

## 2021-09-20 NOTE — ED Notes (Signed)
RN attemtped temp foley w/o success

## 2021-09-20 NOTE — Consult Note (Signed)
[facility] Consult Note   PCP:  Antony Contras, MD      Cardiologist:  Sinclair Grooms, MD   Reason for Consultation: Syncope  HPI:    Aaron Carlson is is an 87 yo male with a PMHx of CAD (diffuse disease on angiography 2005), chronic diastolic heart failure, HTN, DM, HL, prostate CA, CKD stage III, and frailty, who had a syncopal episode earlier today witnessed by family. He apparently became unresponsive for about 1 minute and then regained consciousness. EMS on arrival documented complete heart block, rate in the 30s. Patient was AO x 4. Treated with atropine, versed, external pacing. At one point HR <10 bpm. Loss of pulses. 3 rounds of CPR, 1 dose of epi and 500 ml of NS given.  In the ED patient with some purposeful movements but not responsive to commands. Intubated immediately. Initially transcutaneously paced with successful capture. Later heart rate improved to 110s-120s (sinus tachycardia) and pacer placed on standby. VS: BP 155/93, HR 103, pt on vent.  Labs: K 3.7, gluc 331, Cr 2.20, hs-trop 25, WBC 14.6, Hgb 9.5, Hct 28, Plt 198, viral serologies negative.  CXR without effusions or infiltrates. ECG from 09/18/2021 18:59, that I reviewed personally, showed sinus tachycardia, rate 115 bpm, RBBB, and intraventricular conduction delay.     Home Medications Prior to Admission medications   Medication Sig Start Date End Date Taking? Authorizing Provider  Accu-Chek Softclix Lancets lancets  05/25/20   [provider]  Alcohol Swabs 70 % PADS use when checking blood sugar 03/15/20   [provider]  atorvastatin (LIPITOR) 20 MG tablet 1 tablet    [provider]  BD INSULIN SYRINGE ULTRAFINE 31G X 5/16" 0.5 ML MISC Use as directed 10/10/13   [provider]  benazepril (LOTENSIN) 20 MG tablet TAKE 1 TABLET EVERY DAY 07/25/21   Belva Crome, MD  Blood Glucose Monitoring Suppl (TRUE METRIX METER) w/Device KIT  05/16/20   [provider]  brimonidine (ALPHAGAN) 0.2 % ophthalmic solution 1 drop 2 (two) times daily. 07/29/21   [provider]  calcium carbonate (TUMS - DOSED IN MG ELEMENTAL CALCIUM) 500 MG chewable tablet Chew 1 tablet by mouth daily as needed for indigestion or heartburn.    [provider]  Continuous Blood Gluc Receiver (FREESTYLE LIBRE 2 READER) DEVI See admin instructions. 07/21/21   [provider]  Continuous Blood Gluc Sensor (FREESTYLE LIBRE 2 SENSOR) MISC apply to upper back of arm 07/21/21   [provider]  fluorometholone (FML) 0.1 % ophthalmic suspension 1 drop into affected eye    [provider]  fluticasone (FLONASE) 50 MCG/ACT nasal spray Place into both nostrils daily as needed for allergies or rhinitis.    [provider]  furosemide (LASIX) 80 MG tablet Take 80 mg by mouth daily.    [provider]  Garlic 854 MG TABS Take 1 tablet by mouth daily.    [provider]  insulin lispro (HUMALOG) 100 UNIT/ML injection Use as directed daily at bedtime as directed per sliding scale.    [provider]  insulin NPH Human (HUMULIN N,NOVOLIN N) 100 UNIT/ML injection Inject 38 Units into the skin at bedtime.     [provider]  Iron Combinations (CHROMAGEN) capsule Take 1 capsule by mouth daily.    [provider]  isosorbide mononitrate (IMDUR) 60 MG 24 hr tablet Take 1 tablet (60 mg total) by mouth daily. 07/10/21  Belva Crome, MD  ketorolac (ACULAR) 0.5 % ophthalmic solution 1 drop into affected eye  as needed    [provider]  latanoprost (XALATAN) 0.005 % ophthalmic solution Place 1 drop into both eyes at bedtime.    [provider]  Latanoprostene Bunod (VYZULTA) 0.024 % SOLN 1 drop into affected eye in the evening    [provider]  MAGNESIUM PO Take 1 tablet by mouth daily.    [provider]  metoprolol succinate (TOPROL XL) 25 MG 24 hr tablet  Take 1 tablet (25 mg total) by mouth daily. 06/11/21   Belva Crome, MD  Multiple Vitamin (MULTIVITAMIN) tablet Take 1 tablet by mouth daily.    [provider]  niacin (NIASPAN) 500 MG CR tablet Take 500 mg by mouth at bedtime.    [provider]  niacin (SLO-NIACIN) 500 MG tablet 1 tablet with food    [provider]  nitroGLYCERIN (NITROSTAT) 0.4 MG SL tablet Place 1 tablet (0.4 mg total) under the tongue every 5 (five) minutes as needed for chest pain. 12/09/20 03/09/21  Belva Crome, MD  Omega-3 Fatty Acids (FISH OIL) 1000 MG CAPS 1 capsule    [provider]  ONE TOUCH ULTRA TEST test strip Use as directed 08/21/13   [provider]  pantoprazole (PROTONIX) 40 MG tablet  07/01/21   [provider]  Peppermint Oil (IBGARD) 90 MG CPCR 3-4 capsules    [provider]  potassium chloride (KLOR-CON) 20 MEQ packet Take 20 mEq by mouth daily.    [provider]  Probiotic TBEC See admin instructions.    [provider]  simethicone (MYLICON) 409 MG chewable tablet Chew 125 mg by mouth every 6 (six) hours as needed for flatulence.    [provider]  Jay Schlichter Oil (ARTIFICIAL TEARS) ointment Place into the left eye in the morning, at noon, and at bedtime.    [provider]    Past Medical History: Past Medical History:  Diagnosis Date   CAD in native artery 08/28/2013   Heaviness in chest with exertion. Coronary angiography 2005 demonstrated severe distal LAD and diagonal disease, severe distal RCA and PL OM disease, and severe first obtuse marginal disease. No high-grade proximal coronary disease was present at the time.  The most recent myocardial perfusion study in 2014 was nonischemic/low risk     Chronic renal insufficiency    Colon polyps    adenomatous   Diabetes mellitus    Hyperlipemia    Hypertension    Laryngeal papillomatosis    Prostate cancer ([place])    w/ mets to  bladder    Past Surgical History: Past Surgical History:  Procedure Laterality Date   BLADDER SURGERY     CATARACT EXTRACTION Left 02/2020   CIRCUMCISION  2011   POLYPECTOMY     vocal cords   PROSTATE SURGERY      Family History: Family History  Problem Relation Age of Onset   Breast cancer Mother    Diabetes Mother    Heart failure Father    Heart disease Father    Pancreatic cancer Brother    Colon cancer Neg Hx    Esophageal cancer Neg Hx    Liver disease Neg Hx     Social History: Social History   Socioeconomic History   Marital status: Married    Spouse name: Not on file   Number of children: 4   Years of education: Not  on file   Highest education level: Not on file  Occupational History   Occupation: Retired  Tobacco Use   Smoking status: Former   Smokeless tobacco: Never  Building services engineer Use: Never used  Substance and Sexual Activity   Alcohol use: Yes    Comment: social   Drug use: No   Sexual activity: Not on file  Other Topics Concern   Not on file  Social History Narrative   Not on file   Social Determinants of Health   Financial Resource Strain: Not on file  Food Insecurity: Not on file  Transportation Needs: Not on file  Physical Activity: Not on file  Stress: Not on file  Social Connections: Not on file    Allergies:  Allergies  Allergen Reactions   Penicillins Rash    Other reaction(s): unsure     Review of Systems: Unable to obtain. Patient is intubated  Objective:    Vital Signs:   Pulse Rate:  [35-123] 104 (12/24 1915) Resp:  [12-35] 20 (12/24 1915) BP: (136-167)/(60-101) 167/91 (12/24 1915) SpO2:  [83 %-100 %] 100 % (12/24 1928) FiO2 (%):  [50 %-100 %] 50 % (12/24 1928)    Weight change: There were no vitals filed for this visit.  Intake/Output:   Intake/Output Summary (Last 24 hours) at 09/16/2021 1934 Last data filed at 09/22/2021 1931 Gross per 24 hour  Intake 1000 ml  Output --  Net 1000 ml       Physical Exam    General:  Intubated patient HEENT: unable to assess Neck: supple. Carotids 2+ bilat; no bruits. No lymphadenopathy or thyromegaly appreciated. Cor: Tachycardiac, no audible murmurs Lungs: clear to auscultation anteriorly Abdomen: soft. No hepatosplenomegaly. No bruits or masses.  Extremities: no LE edema Neuro: unable to assess    EKG    ECG from 09/18/2021 18:59, that I reviewed personally, showed sinus tachycardia, rate 115 bpm, RBBB, and intraventricular conduction delay.  Labs   Basic Metabolic Panel: Recent Labs  Lab 09/06/2021 1806 08/28/2021 1823 09/15/2021 1923  NA 137 140 140  K 3.6 3.7 3.5  CL 110 110  --   CO2 19*  --   --   GLUCOSE 331* 312*  --   BUN 38* 37*  --   CREATININE 2.18* 2.20*  --   CALCIUM 8.4*  --   --     Liver Function Tests: Recent Labs  Lab 09/25/2021 1806  AST 35  ALT 36  ALKPHOS 90  BILITOT 0.7  PROT 6.2*  ALBUMIN 3.4*   No results for input(s): LIPASE, AMYLASE in the last 168 hours. No results for input(s): AMMONIA in the last 168 hours.  CBC: Recent Labs  Lab 08/28/2021 1806 09/22/2021 1823 09/12/2021 1923  WBC 14.6*  --   --   NEUTROABS 8.2*  --   --   HGB 10.6* 10.2* 9.5*  HCT 32.4* 30.0* 28.0*  MCV 92.6  --   --   PLT 198  --   --     Cardiac Enzymes: No results for input(s): CKTOTAL, CKMB, CKMBINDEX, TROPONINI in the last 168 hours.  BNP: BNP (last 3 results) No results for input(s): BNP in the last 8760 hours.  ProBNP (last 3 results) No results for input(s): PROBNP in the last 8760 hours.   CBG: No results for input(s): GLUCAP in the last 168 hours.  Coagulation Studies: No results for input(s): LABPROT, INR in the last 72 hours.   Imaging  DG Chest Port 1 View  Result Date: 09/09/2021 CLINICAL DATA:  Endotracheal tube and nasogastric tube placement. Cardiac arrest. EXAM: PORTABLE CHEST 1 VIEW COMPARISON:  April 14, 2011 FINDINGS: Endotracheal tube projects 6.2 cm above the carina.  Nasogastric tube courses below the diaphragm with tip excluded by collimation. Cardiac defibrillator pads overlie the left chest and upper abdomen. The heart size and mediastinal contours are partially obscured but appear within normal limits. Left lung field is partially obscured by overlying defibrillator lead. Right lung appears clear. No visible pleural effusion or pneumothorax. The visualized skeletal structures are unremarkable. IMPRESSION: 1. Endotracheal tube projects 6.2 cm above the carina. 2. Nasogastric tube courses below the diaphragm with tip excluded by collimation. Electronically Signed   By: Dahlia Bailiff M.D.   On: 09/21/2021 18:42    Cardiac catheterization 2005 - 3-vessel CAD with diffuse atherosclerosis LM calcified, normal. LAD mid 60-70%, distal 90% and moderate diffuse dx beyond. D1 70-80%. LCx with diffuse dix OM1 70-80%. RCA dominant, mid 40-60%, RPDA 100%, PLV3 with 90% stenosis.   ECHOCARDIOGRAM 11/2020  IMPRESSIONS   1. Left ventricular ejection fraction, by estimation, is 50%. The left  ventricle has low normal function. The left ventricle demonstrates  regional wall motion abnormalities in the apex. There is mild concentric  left ventricular hypertrophy. Left  ventricular diastolic parameters are consistent with Grade I diastolic  dysfunction (impaired relaxation). The average left ventricular global  longitudinal strain is -12.5 %. The global longitudinal strain is  abnormal, potentially suggesting further  decrease in wall motion that is missed secondary to lack of echo-contrast.  Consider further imaging or additional studies if clinically indicated.   2. Right ventricular systolic function is normal. The right ventricular  size is normal.   3. The mitral valve is normal in structure. No evidence of mitral valve  regurgitation.   4. The aortic valve is tricuspid. There is mild calcification of the  aortic valve. There is mild thickening of the aortic valve.  Aortic valve  regurgitation is not visualized. Mild aortic valve sclerosis is present,  with no evidence of aortic valve  stenosis.   5. The inferior vena cava is normal in size with greater than 50%  respiratory variability, suggesting right atrial pressure of 3 mmHg.    Medications:     Current Medications:  fentaNYL (SUBLIMAZE) injection  25 mcg Intravenous Once    Infusions:  epinephrine     fentaNYL infusion INTRAVENOUS 100 mcg/hr (09/24/2021 1921)      Assessment/Plan   Transient complete heart block Currently in sinus tachycardia with RBBB and IVCD. Previously documented to be in high grade 2nd degree AVB and 3rd degree AVB. Was paced transiently. Also at one point required 1 of epi and CPR for <5 min.  -Monitor on telemetry -Hold any AV nodal blockers (BB etc) -Trend cardiac enzymes -Check electrolytes -Check TSH -Maintain serum K >4.0 and Mg >2.0 -External pacer pads on chest, Atropine at bedside -IV infusion of epi or dopamine or isopril if continued episodes of bradycardia -May require transvenous pacing if unable to get capture through cutaneous pacing -EP consult in AM for evaluation for permanent pacemaker -2-D echocardiogram  2. History of coronary artery disease  -Serial ECGs -Trend troponins  3. Chronic diastolic heart failure Not volume overloaded on clinical exam  -Monitor I and Os -Daily weights -echo in AM  4. Hyperlipidemia  -Continue atorvastatin -Continue niacin   Meade Maw, MD  08/28/2021, 7:34 PM  Cardiology Overnight  Team Please contact [facility] for night-coverage after hours (4p -7a ) and weekends on [email or web address]

## 2021-09-20 NOTE — Progress Notes (Signed)
Chaplain encountered pt's granddaughter in ED hallway seeking directions.  Pt has been moved to [place], per his nurse. Chaplain facilitated communication with staff in the new unit, escorted family to waiting area, and provided emotional support.    Please contact if ongoing support is needed.  Minus [place], [place] Pager: [phone]    08/30/2021 2200  Clinical Encounter Type  Visited With Family  Visit Type Initial;Critical Care  Referral From Family  Consult/Referral To Chaplain  Stress Factors  Patient Stress Factors Health changes  Family Stress Factors Family relationships

## 2021-09-20 NOTE — ED Triage Notes (Signed)
Pt arrives via [facility] from home. Pt had witnessed syncopal episode, hit head on ground, pt was unresponsive for 1 min. Ems arrived and pt was in compete heart block w/ rate of 30, pt aox4, given 1mg  atropine. EMS began pacing, given 5mg  versed. Pt HR decreased to 10bpm, pt then lost pulses, cpr started. 3 rounds of cpr performed w/ 1 epi given, approx 545ml NS. 20g RAC

## 2021-09-20 NOTE — ED Notes (Signed)
Pt no longer being paced, Dr Darl Householder at bedside. Pt tachycardic on monitor now

## 2021-09-20 NOTE — H&P (Incomplete Revision)
NAME:  Aaron Carlson, MRN:  [id number], DOB:  March 09, 1933, LOS: 0 ADMISSION DATE:  09/08/2021, CONSULTATION DATE:  12/24 REFERRING MD:  Aaron Carlson, CHIEF COMPLAINT:  syncope   09/27/2021 APP Aaron Carlson. I saw and evaluated the patient. Discussed with APP and agree with their findings and plan as documented below.  I have seen and evaluated the patient s/p out of hospital cardiac arrest  S:    O: Blood pressure (!) 160/96, pulse (!) 103, resp. rate (!) 21, height $RemoveBe'5\' 8"'ckurcGXbl$  (1.727 m), SpO2 99 %.   Exam: Gen:       Intubated and sedated on fentnyl gtt. No obvious distress HEENT:    anicteric sclera. Arcus senilis. Moist oral mucosa  Lungs:    Clear to auscultation bilaterally; minimal ventilator support with normal plateau pressures CV:         tachycardic, regular. Cap refill < 2 seconds. 1+ pedal edema Abd:      Nondistended, no palpable organomegaly Ext:    No cyanosis or clubbbing Skin:       Warm and dry; no rash on exposed skin Neuro:    pupils equal in size, appropriately reactive to light. Sticks out tongue, wiggles feet to command. Does not move upper extremities to command but does withdraw from noxious stimuli.  A/P:   #Out of Hospital Cardiac Arrest --Presumed etiology: cardiac in setting of demonstrated high grade AV block that has since resolved --Time down: 6 minutes with EMS --CPR: 3 cycles, 1 of epi --Shockable rhythm reported: No --Initial neuro exam notable for: moving lower extremities to command, sticks out tongue, regards speaker, does not  --POCUS notable for: *** --Initial imaging notable for: *** Management Plan: Neurology consultation for LTM monitoring and neuroprognostication Targeted Temperature Management Protocol: goal normothermia (<37.5oC) x 72 hours Diagnostic studies ordered: TTE, cycle troponin/EKGs, *** Hemodynamic goal: Maintain MAP > 65 mmHg Respiratory management: Lung protective ventilation, SpO2 goal > 92%, Avoid hyperoxia, PaCO2 goal  35-50    Patient critically ill due to cardiac arrest, transient high degree AV block Interventions to address this today serial neurologic exams, continued support with mechanical ventilation, telemetry monitoring Risk of deterioration without these interventions is high  I personally spent 42 minutes providing critical care not including any separately billable procedures   Aaron Carlson   History of Present Illness:  Patient is encephalopathic and/or intubated. Therefore history has been obtained from chart review.   Aaron Carlson, is a 87 y.o. male, who presented to the [facility] ED with a chief complaint of syncope  They have a pertinent past medical history of CAD, combined systolic and diastolic HF, HTN, HLD, DM, CKD 3/4, GERD  Per documentation, patient had witnessed syncopal episode while at home, hit head on ground, reported unresponsive for 1 min. On arrival EMS found patient in complete heart block with rate of 30. Patient reported oriented x4. Atropine given, pacing initiated, $RemoveBeforeDEI'5mg'DiKwXZpEvjgRDCZS$  versed given. Per EDP patient removed pacing pads, HR decreased, and then became pulseless. CPR initiated with 3 rounds of CPR and 1 epi  He was intubated in the ED. Cardiology was consulted. CT head is pending. Trop 25. ECG: ST, RBBB, IVCD ,7.29/36/352.17.2. CXR with no infiltrate, pneumo, or effusion.   PCCM was consulted for admission.   Pertinent  Medical History  CAD, combined systolic and diastolic HF, HTN, HLD, DM, CKD 3/4, GERD  Significant Hospital Events: Including procedures, antibiotic start and stop dates in addition to other pertinent events  12/24 Syncope, EMS called, 3rd degree HB, paced, removed pads, asystole, CPR, intubated in ED. CT head>  Interim History / Subjective:  See above  100 fentanyl  Unable to obtain subjective evaluation due to patient status  Objective   Blood pressure (!) 167/91, pulse (!) 104, resp. rate 20, height $RemoveBe'5\' 8"'fyLbNEvyA$   (1.727 m), SpO2 100 %.    Vent Mode: PRVC FiO2 (%):  [100 %] 100 % Set Rate:  [20 bmp] 20 bmp Vt Set:  [540 mL] 540 mL PEEP:  [5 cmH20] 5 cmH20 Plateau Pressure:  [21 cmH20] 21 cmH20  No intake or output data in the 24 hours ending 09/14/2021 1927 There were no vitals filed for this visit.  Examination: General: In bed, NAD, frail appearing HEENT: MM pink/moist, anicteric, atraumatic Neuro: RASS -1, PERRL 62mm, follows commands with toe wiggles and sticks tongue out. CV: S1S2, ST, no m/r/g appreciated PULM:  clear in the upper lobes, clear in the lower lobes, trachea midline, chest expansion symmetric GI: soft, bsx4 hypoactive, non-tender   Extremities: warm/dry, no pretibial edema, capillary refill less than 3 seconds  Skin:  no rashes or lesions noted  WBC 14.6 HGB 10.6 BG 331 CO2 19 Trop 25 7.29/36/352/17.2  ECG: ST, RBBB, IVCD CXR with no infiltrate, pneumo, or effusion.  Resolved Hospital Problem list     Assessment & Plan:  Cardiac arrest- asystole,Bradycardic with EMS, paced, 3 rounds CPR with 1 epi. Third degree heart block- presented on admission  Syncope- suspect secondary to arrhythmia vs hypoperfusion NAGMA- suspect secondary to chronic renal disease Initial troponin 25, CXR with no infiltrate, pneumo, or effusion. Diffuse global hypokinesis on POCUS. Family denies sick contacts, in normal state of health today, suspect WBC 14.6 reactive after CPR.  -Cardiology consulted in ED. Appreciate assistance. Workup per cardiology.  -Admit to ICU -EP consult planned in AM for eval of permanent pacemaker. -Hold AV nodal blockers, continue pacer pads on chest, atropine at bedside. -IV epi if continued episodes of bradycardia occur.  -Admit to ICU with continuous tele and SPO2 monitoring -Goal MAP greater than 65. Currently normotensive.  -Goal normothermia. Target temperature between 36 and 37.8 degrees celsius. Notify provider if temperature goals are not met. Place  esophageal or bladder temperature probe. -Follow up CMP, MG, Phos, CBC, INR -Start PRN tylenol $RemoveBef'650mg'KLvEkkgVkA$  q6h per tube or PR. -Trend troponin, lactate, and EKG -Strict I&O -Goal K above 4, goal MG above 2 -AC plan per cardiology. Head CT pending. -Follow WBC/fever curve  Acute Metabolic Encephalopathy Secondary to cardiac arrest. Following commands in ED. -Obtain spot EEG.  -Obtain head CT. -PAD bundle as discussed below. Goal RASS 0. Titrate medication to goal. -Frequent neuro exams -Continue neuroprotective measures: goal normothermia, euglycemia, HOB greater than 30 when able, head in neutral alignment, normocapnia, normoxia, normonatremia.    Acute respiratory failure with hypoxia Secondary to cardiac arrest. Documented SPO2 of 83%. -LTVV strategy with tidal volumes of 4-8 cc/kg ideal body weight -Goal plateau pressures less than 30 and driving pressures less than 15 -Wean PEEP/FiO2 for SpO2 92-98% -VAP bundle -Daily SAT and SBT -PAD bundle with fentanyl gtt -RASS goal 0 to -1 -Follow intermittent CXR and ABG PRN  HX CAD- [facility]  Hx systolic and diastolic CHF HX HLD  HX HTN -Continue tele -Continue atorvastatin -Continue niacin -Trend troponin and ECG -Holding home antihypertensives at this time.  Syncope/Fall -fall precautions  DM2 BG 331 -Blood Glucose goal 140-180. -SSI -Q4h CBG  CKD3-4 Creat  2.18, 2.3 8 months ago -Ensure renal perfusion. Goal MAP 65 or greater. -Avoid neprotoxic drugs as possible. -Strict I&O's -Follow up AM creatinine  GERD -PPI  Best Practice (right click and "Reselect all SmartList Selections" daily)   Diet/type: NPO w/ meds via tube DVT prophylaxis: SCD, plan pending GI prophylaxis: PPI Lines: N/A Foley:  Yes, and it is still needed Code Status:  full code Last date of multidisciplinary goals of care discussion [Full scope, discussed with Everlena Austell at bedside on 12/24 ]  Labs   CBC: Recent Labs   Lab 09/13/2021 1806 08/30/2021 1823 08/28/2021 1923  WBC 14.6*  --   --   NEUTROABS 8.2*  --   --   HGB 10.6* 10.2* 9.5*  HCT 32.4* 30.0* 28.0*  MCV 92.6  --   --   PLT 198  --   --     Basic Metabolic Panel: Recent Labs  Lab 09/24/2021 1806 09/25/2021 1823 09/19/2021 1923  NA 137 140 140  K 3.6 3.7 3.5  CL 110 110  --   CO2 19*  --   --   GLUCOSE 331* 312*  --   BUN 38* 37*  --   CREATININE 2.18* 2.20*  --   CALCIUM 8.4*  --   --    GFR: CrCl cannot be calculated (Unknown ideal weight.). Recent Labs  Lab 08/30/2021 1806  WBC 14.6*    Liver Function Tests: Recent Labs  Lab 09/27/2021 1806  AST 35  ALT 36  ALKPHOS 90  BILITOT 0.7  PROT 6.2*  ALBUMIN 3.4*   No results for input(s): LIPASE, AMYLASE in the last 168 hours. No results for input(s): AMMONIA in the last 168 hours.  ABG    Component Value Date/Time   PHART 7.294 (L) 09/03/2021 1923   PCO2ART 36.8 09/13/2021 1923   PO2ART 352 (H) 09/04/2021 1923   HCO3 17.8 (L) 09/23/2021 1923   TCO2 19 (L) 09/03/2021 1923   ACIDBASEDEF 8.0 (H) 09/27/2021 1923   O2SAT 100.0 08/31/2021 1923     Coagulation Profile: No results for input(s): INR, PROTIME in the last 168 hours.  Cardiac Enzymes: No results for input(s): CKTOTAL, CKMB, CKMBINDEX, TROPONINI in the last 168 hours.  HbA1C: Hgb A1c MFr Bld  Date/Time Value Ref Range Status  11/04/2006 10:39 AM 7.5 (H) 4.6 - 6.0 % Final    Comment:    See lab report for associated comment(s)  09/02/2006 08:03 AM 7.3 (H) 4.6 - 6.0 % Final    Comment:    See lab report for associated comment(s)    CBG: No results for input(s): GLUCAP in the last 168 hours.  Review of Systems:   Unable to obtain ROS due to patient status.  Past Medical History:  He,  has a past medical history of CAD in native artery (08/28/2013), Chronic renal insufficiency, Colon polyps, Diabetes mellitus, Hyperlipemia, Hypertension, Laryngeal papillomatosis, and Prostate cancer ([place]).   Surgical  History:   Past Surgical History:  Procedure Laterality Date   BLADDER SURGERY     CATARACT EXTRACTION Left 02/2020   CIRCUMCISION  2011   POLYPECTOMY     vocal cords   PROSTATE SURGERY       Social History:   reports that he has quit smoking. He has never used smokeless tobacco. He reports current alcohol use. He reports that he does not use drugs.   Family History:  His family history includes Breast cancer in his mother; Diabetes in his mother;  Heart disease in his father; Heart failure in his father; Pancreatic cancer in his brother. There is no history of Colon cancer, Esophageal cancer, or Liver disease.   Allergies Allergies  Allergen Reactions   Penicillins Rash    Other reaction(s): unsure     Home Medications  Prior to Admission medications   Medication Sig Start Date End Date Taking? Authorizing Provider  Accu-Chek Softclix Lancets lancets  05/25/20   [provider]  Alcohol Swabs 70 % PADS use when checking blood sugar 03/15/20   [provider]  atorvastatin (LIPITOR) 20 MG tablet 1 tablet    [provider]  BD INSULIN SYRINGE ULTRAFINE 31G X 5/16" 0.5 ML MISC Use as directed 10/10/13   [provider]  benazepril (LOTENSIN) 20 MG tablet TAKE 1 TABLET EVERY DAY 07/25/21   Belva Crome, MD  Blood Glucose Monitoring Suppl (TRUE METRIX METER) w/Device KIT  05/16/20   [provider]  brimonidine (ALPHAGAN) 0.2 % ophthalmic solution 1 drop 2 (two) times daily. 07/29/21   [provider]  calcium carbonate (TUMS - DOSED IN MG ELEMENTAL CALCIUM) 500 MG chewable tablet Chew 1 tablet by mouth daily as needed for indigestion or heartburn.    [provider]  Continuous Blood Gluc Receiver (FREESTYLE LIBRE 2 READER) DEVI See admin instructions. 07/21/21   [provider]  Continuous Blood Gluc Sensor (FREESTYLE LIBRE 2 SENSOR) MISC apply to upper back of arm 07/21/21   [provider]   fluorometholone (FML) 0.1 % ophthalmic suspension 1 drop into affected eye    [provider]  fluticasone (FLONASE) 50 MCG/ACT nasal spray Place into both nostrils daily as needed for allergies or rhinitis.    [provider]  furosemide (LASIX) 80 MG tablet Take 80 mg by mouth daily.    [provider]  Garlic 161 MG TABS Take 1 tablet by mouth daily.    [provider]  insulin lispro (HUMALOG) 100 UNIT/ML injection Use as directed daily at bedtime as directed per sliding scale.    [provider]  insulin NPH Human (HUMULIN N,NOVOLIN N) 100 UNIT/ML injection Inject 38 Units into the skin at bedtime.     [provider]  Iron Combinations (CHROMAGEN) capsule Take 1 capsule by mouth daily.    [provider]  isosorbide mononitrate (IMDUR) 60 MG 24 hr tablet Take 1 tablet (60 mg total) by mouth daily. 07/10/21   Belva Crome, MD  ketorolac (ACULAR) 0.5 % ophthalmic solution 1 drop into affected eye  as needed    [provider]  latanoprost (XALATAN) 0.005 % ophthalmic solution Place 1 drop into both eyes at bedtime.    [provider]  Latanoprostene Bunod (VYZULTA) 0.024 % SOLN 1 drop into affected eye in the evening    [provider]  MAGNESIUM PO Take 1 tablet by mouth daily.    [provider]  metoprolol succinate (TOPROL XL) 25 MG 24 hr tablet Take 1 tablet (25 mg total) by mouth daily. 06/11/21   Belva Crome, MD  Multiple Vitamin (MULTIVITAMIN) tablet Take 1 tablet by mouth daily.    [provider]  niacin (NIASPAN) 500 MG CR tablet Take 500 mg by mouth at bedtime.    [provider]  niacin (SLO-NIACIN) 500 MG tablet 1 tablet with food    [provider]  nitroGLYCERIN (NITROSTAT) 0.4 MG SL tablet Place 1 tablet (0.4 mg total) under the tongue every 5 (  five) minutes as needed for chest pain. 12/09/20 03/09/21  Belva Crome, MD  Omega-3 Fatty Acids (FISH  OIL) 1000 MG CAPS 1 capsule    [provider]  ONE TOUCH ULTRA TEST test strip Use as directed 08/21/13   [provider]  pantoprazole (PROTONIX) 40 MG tablet  07/01/21   [provider]  Peppermint Oil (IBGARD) 90 MG CPCR 3-4 capsules    [provider]  potassium chloride (KLOR-CON) 20 MEQ packet Take 20 mEq by mouth daily.    [provider]  Probiotic TBEC See admin instructions.    [provider]  simethicone (MYLICON) 825 MG chewable tablet Chew 125 mg by mouth every 6 (six) hours as needed for flatulence.    [provider]  Jay Schlichter Oil (ARTIFICIAL TEARS) ointment Place into the left eye in the morning, at noon, and at bedtime.    [provider]     Critical care time: 24 minutes    Redmond [employer]., MSN, APRN, AGACNP-BC [facility]  09/24/2021 , 7:27 PM  Please see [email or web address] for pager details  If no response, please call [phone] After hours, please call Elink at [phone]

## 2021-09-21 ENCOUNTER — Inpatient Hospital Stay (HOSPITAL_COMMUNITY): Payer: Medicare HMO

## 2021-09-21 DIAGNOSIS — I469 Cardiac arrest, cause unspecified: Secondary | ICD-10-CM

## 2021-09-21 DIAGNOSIS — I442 Atrioventricular block, complete: Secondary | ICD-10-CM | POA: Diagnosis not present

## 2021-09-21 LAB — POCT I-STAT 7, (LYTES, BLD GAS, ICA,H+H)
Acid-base deficit: 9 mmol/L — ABNORMAL HIGH (ref 0.0–2.0)
Bicarbonate: 15.7 mmol/L — ABNORMAL LOW (ref 20.0–28.0)
Calcium, Ion: 1.2 mmol/L (ref 1.15–1.40)
HCT: 28 % — ABNORMAL LOW (ref 39.0–52.0)
Hemoglobin: 9.5 g/dL — ABNORMAL LOW (ref 13.0–17.0)
O2 Saturation: 99 %
Patient temperature: 97.8
Potassium: 3.9 mmol/L (ref 3.5–5.1)
Sodium: 140 mmol/L (ref 135–145)
TCO2: 17 mmol/L — ABNORMAL LOW (ref 22–32)
pCO2 arterial: 27.5 mmHg — ABNORMAL LOW (ref 32.0–48.0)
pH, Arterial: 7.364 (ref 7.350–7.450)
pO2, Arterial: 160 mmHg — ABNORMAL HIGH (ref 83.0–108.0)

## 2021-09-21 LAB — PHOSPHORUS: Phosphorus: 2.4 mg/dL — ABNORMAL LOW (ref 2.5–4.6)

## 2021-09-21 LAB — GLUCOSE, CAPILLARY
Glucose-Capillary: 186 mg/dL — ABNORMAL HIGH (ref 70–99)
Glucose-Capillary: 133 mg/dL — ABNORMAL HIGH (ref 70–99)
Glucose-Capillary: 122 mg/dL — ABNORMAL HIGH (ref 70–99)
Glucose-Capillary: 145 mg/dL — ABNORMAL HIGH (ref 70–99)
Glucose-Capillary: 135 mg/dL — ABNORMAL HIGH (ref 70–99)

## 2021-09-21 LAB — BASIC METABOLIC PANEL
Anion gap: 7 (ref 5–15)
BUN: 37 mg/dL — ABNORMAL HIGH (ref 8–23)
CO2: 17 mmol/L — ABNORMAL LOW (ref 22–32)
Calcium: 8.4 mg/dL — ABNORMAL LOW (ref 8.9–10.3)
Chloride: 116 mmol/L — ABNORMAL HIGH (ref 98–111)
Creatinine, Ser: 1.94 mg/dL — ABNORMAL HIGH (ref 0.61–1.24)
GFR, Estimated: 33 mL/min — ABNORMAL LOW (ref >60)
Glucose, Bld: 147 mg/dL — ABNORMAL HIGH (ref 70–99)
Potassium: 3.8 mmol/L (ref 3.5–5.1)
Sodium: 140 mmol/L (ref 135–145)

## 2021-09-21 LAB — CBC
HCT: 28.1 % — ABNORMAL LOW (ref 39.0–52.0)
Hemoglobin: 9.4 g/dL — ABNORMAL LOW (ref 13.0–17.0)
MCH: 30 pg (ref 26.0–34.0)
MCHC: 33.5 g/dL (ref 30.0–36.0)
MCV: 89.8 fL (ref 80.0–100.0)
Platelets: 151 10*3/uL (ref 150–400)
RBC: 3.13 MIL/uL — ABNORMAL LOW (ref 4.22–5.81)
RDW: 14 % (ref 11.5–15.5)
WBC: 12.7 10*3/uL — ABNORMAL HIGH (ref 4.0–10.5)
nRBC: 0 % (ref 0.0–0.2)

## 2021-09-21 LAB — LIPID PANEL
Cholesterol: 147 mg/dL (ref 0–200)
HDL: 45 mg/dL (ref >40)
LDL Cholesterol: 92 mg/dL (ref 0–99)
Total CHOL/HDL Ratio: 3.3 RATIO
Triglycerides: 51 mg/dL (ref <150)
VLDL: 10 mg/dL (ref 0–40)

## 2021-09-21 LAB — HEPARIN LEVEL (UNFRACTIONATED): Heparin Unfractionated: 0.8 IU/mL — ABNORMAL HIGH (ref 0.30–0.70)

## 2021-09-21 LAB — TROPONIN I (HIGH SENSITIVITY): Troponin I (High Sensitivity): 7052 ng/L (ref <18)

## 2021-09-21 LAB — ECHOCARDIOGRAM COMPLETE
Area-P 1/2: 3.12 cm2
Height: 68 in
S' Lateral: 2.3 cm
Weight: 3241.64 oz

## 2021-09-21 LAB — LACTIC ACID, PLASMA: Lactic Acid, Venous: 1.7 mmol/L (ref 0.5–1.9)

## 2021-09-21 LAB — MAGNESIUM: Magnesium: 2 mg/dL (ref 1.7–2.4)

## 2021-09-21 MED ORDER — ATORVASTATIN CALCIUM 10 MG PO TABS
20.0000 mg | ORAL_TABLET | Freq: Every day | ORAL | Status: DC
  Administered 2021-09-21: 02:00:00 20 mg via ORAL
  Filled 2021-09-21: qty 2

## 2021-09-21 MED ORDER — HEPARIN (PORCINE) 25000 UT/250ML-% IV SOLN
1100.0000 [IU]/h | INTRAVENOUS | Status: DC
  Administered 2021-09-21: 02:00:00 1250 [IU]/h via INTRAVENOUS
  Administered 2021-09-21: 18:00:00 1100 [IU]/h via INTRAVENOUS
  Filled 2021-09-21 (×2): qty 250

## 2021-09-21 MED ORDER — FUROSEMIDE 10 MG/ML IJ SOLN
40.0000 mg | Freq: Once | INTRAMUSCULAR | Status: AC
  Administered 2021-09-21: 12:00:00 40 mg via INTRAVENOUS
  Filled 2021-09-21: qty 4

## 2021-09-21 MED ORDER — HEPARIN BOLUS VIA INFUSION
3000.0000 [IU] | Freq: Once | INTRAVENOUS | Status: AC
  Administered 2021-09-21: 02:00:00 3000 [IU] via INTRAVENOUS
  Filled 2021-09-21: qty 3000

## 2021-09-21 MED ORDER — ASPIRIN 325 MG PO TABS
325.0000 mg | ORAL_TABLET | Freq: Once | ORAL | Status: AC
  Administered 2021-09-21: 02:00:00 325 mg via ORAL
  Filled 2021-09-21: qty 1

## 2021-09-21 MED ORDER — ASPIRIN 81 MG PO CHEW: 81.0000 mg | CHEWABLE_TABLET | Freq: Every day | ORAL | Status: DC

## 2021-09-21 MED ORDER — LIDOCAINE 5 % EX PTCH
1.0000 | MEDICATED_PATCH | CUTANEOUS | Status: AC
  Administered 2021-09-21: 12:00:00 1 via TRANSDERMAL
  Filled 2021-09-21: qty 1

## 2021-09-21 MED ORDER — GUAIFENESIN ER 600 MG PO TB12
600.0000 mg | ORAL_TABLET | Freq: Two times a day (BID) | ORAL | Status: DC | PRN
  Administered 2021-09-21: 12:00:00 600 mg via ORAL
  Filled 2021-09-21 (×2): qty 1

## 2021-09-21 MED ORDER — PANTOPRAZOLE 2 MG/ML SUSPENSION
40.0000 mg | Freq: Every day | ORAL | Status: DC
  Administered 2021-09-21 – 2021-09-24 (×4): 40 mg
  Filled 2021-09-21 (×4): qty 20

## 2021-09-21 MED ORDER — POTASSIUM PHOSPHATES 15 MMOLE/5ML IV SOLN
15.0000 mmol | Freq: Once | INTRAVENOUS | Status: AC
  Administered 2021-09-21: 22:00:00 15 mmol via INTRAVENOUS
  Filled 2021-09-21: qty 5

## 2021-09-21 NOTE — Progress Notes (Signed)
°  Echocardiogram 2D Echocardiogram has been performed.  Aaron Carlson 09/21/2021, 2:35 PM

## 2021-09-21 NOTE — Consult Note (Signed)
Cardiology Consultation:   Patient ID: Aaron Carlson MRN: [id number]; DOB: 12/16/1932  Admit date: 09/08/2021 Date of Consult: 09/21/2021  PCP:  Antony Contras, MD   [facility] Providers Cardiologist:  Sinclair Grooms, MD        Patient Profile:   Aaron Carlson is a 87 y.o. male with a hx of coronary artery disease, diastolic heart failure, hypertension, hyperlipidemia, prostate cancer, CKD stage III who is being seen 09/21/2021 for the evaluation of heart block at the request of Gwyndolyn Kaufman.  History of Present Illness:   Aaron Carlson.  In the emergency room after an episode of unresponsiveness for 1 minute.  He regained consciousness.  On EMS arrival, he was in complete heart block with heart rates in the 30s.  He was alert and oriented x4.  He was treated with atropine, Versed, external pacing.  At 1 point, apparently his heart rate was less than 10 bpm.  He had a loss of pulses.  He had 3 rounds of CPR, 1 dose of epinephrine 500 mL of normal saline.  In the emergency room, he had some purposeful movements, but was not responding to commands.  He was intubated in the emergency room and transcutaneously paced with successful capture.  He did have recovery of conduction to sinus tachycardia with heart rates in the 100s to 120s.  Per the patient, he was doing well yesterday.  He says that he woke up and had 3 EMS personnel around him.  Prior to the episode, he felt well and is having a normal day.  He does get short of breath with exertion, but this has been stable.  He does not remember having acute chest pain or shortness of breath prior to his episode.   Past Medical History:  Diagnosis Date   CAD in native artery 08/28/2013   Heaviness in chest with exertion. Coronary angiography 2005 demonstrated severe distal LAD and diagonal disease, severe distal RCA and PL OM disease, and severe first obtuse marginal disease. No high-grade proximal coronary disease was present at the  time.  The most recent myocardial perfusion study in 2014 was nonischemic/low risk     Chronic renal insufficiency    Colon polyps    adenomatous   Diabetes mellitus    Hyperlipemia    Hypertension    Laryngeal papillomatosis    Prostate cancer ([place])    w/ mets to bladder    Past Surgical History:  Procedure Laterality Date   BLADDER SURGERY     CATARACT EXTRACTION Left 02/2020   CIRCUMCISION  2011   POLYPECTOMY     vocal cords   PROSTATE SURGERY       Home Medications:  Prior to Admission medications   Medication Sig Start Date End Date Taking? Authorizing Provider  atorvastatin (LIPITOR) 20 MG tablet Take 20 mg by mouth at bedtime.   Yes [provider]  benazepril (LOTENSIN) 20 MG tablet TAKE 1 TABLET EVERY DAY Patient taking differently: Take 20 mg by mouth daily. 07/25/21  Yes Belva Crome, MD  dorzolamide (TRUSOPT) 2 % ophthalmic solution Place 1 drop into both eyes 2 (two) times daily.   Yes [provider]  fluorometholone (FML) 0.1 % ophthalmic suspension Place 1 drop into the left eye 3 (three) times daily.   Yes [provider]  furosemide (LASIX) 80 MG tablet Take 80 mg by mouth daily.   Yes [provider]  isosorbide mononitrate (IMDUR) 60 MG 24 hr  tablet Take 1 tablet (60 mg total) by mouth daily. 07/10/21  Yes Belva Crome, MD  ketorolac (ACULAR) 0.4 % SOLN Place 1 drop into the left eye 4 (four) times daily.   Yes [provider]  latanoprost (XALATAN) 0.005 % ophthalmic solution Place 1 drop into both eyes at bedtime.   Yes [provider]  metoprolol succinate (TOPROL XL) 25 MG 24 hr tablet Take 1 tablet (25 mg total) by mouth daily. 06/11/21  Yes Belva Crome, MD  nitroGLYCERIN (NITROSTAT) 0.4 MG SL tablet Place 1 tablet (0.4 mg total) under the tongue every 5 (five) minutes as needed for chest pain. 12/09/20 10/16/21 Yes Belva Crome, MD  NON FORMULARY Take 1-2 capsules by mouth See admin instructions.  Colon Clenz capsules- Take 1-2 capsules by mouth once a day as needed for constipation   Yes [provider]  NOVOLIN N 100 UNIT/ML injection Inject 38 Units into the skin at bedtime.   Yes [provider]  pantoprazole (PROTONIX) 40 MG tablet Take 40 mg by mouth daily as needed (for heartburn). 07/01/21  Yes [provider]  potassium chloride (KLOR-CON) 10 MEQ tablet Take 10 mEq by mouth daily.   Yes [provider]  prednisoLONE acetate (PRED FORTE) 1 % ophthalmic suspension Place 1 drop into both eyes at bedtime.   Yes [provider]  SIMBRINZA 1-0.2 % SUSP Place 1 drop into both eyes 2 (two) times daily.   Yes [provider]  simethicone (MYLICON) 097 MG chewable tablet Chew 125 mg by mouth every 6 (six) hours as needed for flatulence.   Yes [provider]  Accu-Chek Softclix Lancets lancets  05/25/20   [provider]  Alcohol Swabs 70 % PADS use when checking blood sugar 03/15/20   [provider]  BD INSULIN SYRINGE ULTRAFINE 31G X 5/16" 0.5 ML MISC Use as directed 10/10/13   [provider]  Blood Glucose Monitoring Suppl (TRUE METRIX METER) w/Device KIT  05/16/20   [provider]  brimonidine (ALPHAGAN) 0.2 % ophthalmic solution Place 1 drop into both eyes 2 (two) times daily. 07/29/21   [provider]  calcium carbonate (TUMS - DOSED IN MG ELEMENTAL CALCIUM) 500 MG chewable tablet Chew 1 tablet by mouth daily as needed for indigestion or heartburn.    [provider]  Continuous Blood Gluc Receiver (FREESTYLE LIBRE 2 READER) DEVI See admin instructions. 07/21/21   [provider]  Continuous Blood Gluc Sensor (FREESTYLE LIBRE 2 SENSOR) MISC apply to upper back of arm 07/21/21   [provider]  fluticasone (FLONASE) 50 MCG/ACT nasal spray Place into both nostrils daily as needed for allergies or rhinitis.    [provider]  insulin lispro (HUMALOG)  100 UNIT/ML injection Use as directed daily at bedtime as directed per sliding scale.    [provider]  Iron Combinations (CHROMAGEN) capsule Take 1 capsule by mouth daily.    [provider]  Latanoprostene Bunod (VYZULTA) 0.024 % SOLN 1 drop into affected eye in the evening    [provider]  MAGNESIUM PO Take 1 tablet by mouth daily.    [provider]  Multiple Vitamin (MULTIVITAMIN) tablet Take 1 tablet by mouth daily.    [provider]  niacin (NIASPAN) 500 MG CR tablet Take 500 mg by mouth at bedtime.    [provider]  niacin (SLO-NIACIN) 500 MG tablet 1 tablet with food    [provider]  Omega-3 Fatty Acids (FISH OIL) 1000 MG CAPS 1 capsule    [provider]  ONE TOUCH ULTRA TEST test strip Use as directed 08/21/13   [provider]  Peppermint Oil (IBGARD) 90 MG CPCR 3-4 capsules    [provider]  Probiotic TBEC See admin instructions.    [provider]  Jay Schlichter Oil (ARTIFICIAL TEARS) ointment Place into the left eye in the morning, at noon, and at bedtime.    [provider]    Inpatient Medications: Scheduled Meds:  acetaminophen  650 mg Oral Q4H   Or   acetaminophen (TYLENOL) oral liquid 160 mg/5 mL  650 mg Per Tube Q4H   Or   acetaminophen  650 mg Rectal Q4H   [START ON 09/22/2021] aspirin  81 mg Oral Daily   atorvastatin  20 mg Oral Daily   chlorhexidine gluconate (MEDLINE KIT)  15 mL Mouth Rinse BID   Chlorhexidine Gluconate Cloth  6 each Topical Daily   docusate  100 mg Per Tube BID   fentaNYL (SUBLIMAZE) injection  25 mcg Intravenous Once   insulin aspart  0-15 Units Subcutaneous Q4H   mouth rinse  15 mL Mouth Rinse 10 times per day   pantoprazole (PROTONIX) IV  40 mg Intravenous QHS   polyethylene glycol  17 g Per Tube Daily   Continuous Infusions:  sodium chloride 10 mL/hr at 09/21/21 0900   epinephrine     fentaNYL infusion  INTRAVENOUS 75 mcg/hr (09/21/21 0900)   heparin 1,250 Units/hr (09/21/21 0900)   PRN Meds: etomidate, fentaNYL, ondansetron (ZOFRAN) IV, rocuronium  Allergies:    Allergies  Allergen Reactions   Penicillins Rash    Social History:   Social History   Socioeconomic History   Marital status: Married    Spouse name: Not on file   Number of children: 4   Years of education: Not on file   Highest education level: Not on file  Occupational History   Occupation: Retired  Tobacco Use   Smoking status: Former   Smokeless tobacco: Never  Scientific laboratory technician Use: Never used  Substance and Sexual Activity   Alcohol use: Yes    Comment: social   Drug use: No   Sexual activity: Not on file  Other Topics Concern   Not on file  Social History Narrative   Not on file   Social Determinants of Health   Financial Resource Strain: Not on file  Food Insecurity: Not on file  Transportation Needs: Not on file  Physical Activity: Not on file  Stress: Not on file  Social Connections: Not on file  Intimate Partner Violence: Not on file    Family History:    Family History  Problem Relation Age of Onset   Breast cancer Mother    Diabetes Mother    Heart failure Father    Heart disease Father    Pancreatic cancer Brother    Colon cancer Neg Hx    Esophageal cancer Neg Hx    Liver disease Neg Hx      ROS:  Please see the history of present illness.   All other ROS reviewed and negative.     Physical Exam/Data:   Vitals:   09/21/21 0758 09/21/21 0800 09/21/21 0827 09/21/21 0908  BP:  (!) 128/106  120/65  Pulse:  65  67  Resp:  (!) 25  (!) 24  Temp:      TempSrc:      SpO2:  100% 100% 100% 95%  Weight:      Height:        Intake/Output Summary (Last 24 hours) at 09/21/2021 0949 Last data filed at 09/21/2021 0900 Gross per 24 hour  Intake 1383.93 ml  Output 400 ml  Net 983.93 ml   Last 3 Weights 09/01/2021 06/19/2021 06/11/2021  Weight (lbs) 202 lb 9.6 oz 206 lb  207 lb  Weight (kg) 91.9 kg 93.441 kg 93.895 kg     Body mass index is 30.81 kg/m.  General:  Well nourished, well developed, in no acute distress HEENT: normal Neck: no JVD Vascular: No carotid bruits; Distal pulses 2+ bilaterally Cardiac:  normal S1, S2; RRR; no murmur  Lungs:  clear to auscultation bilaterally, no wheezing, rhonchi or rales  Abd: soft, nontender, no hepatomegaly  Ext: no edema Musculoskeletal:  No deformities, BUE and BLE strength normal and equal Skin: warm and dry  Neuro:  CNs 2-12 intact, no focal abnormalities noted Psych:  Normal affect   EKG:  The EKG was personally reviewed and demonstrates: Sinus rhythm, high-grade AV block Telemetry:  Telemetry was personally reviewed and demonstrates: Sinus rhythm  Relevant CV Studies: TTE 12/06/20  1. Left ventricular ejection fraction, by estimation, is 50%. The left  ventricle has low normal function. The left ventricle demonstrates  regional wall motion abnormalities in the apex. There is mild concentric  left ventricular hypertrophy. Left  ventricular diastolic parameters are consistent with Grade I diastolic  dysfunction (impaired relaxation). The average left ventricular global  longitudinal strain is -12.5 %. The global longitudinal strain is  abnormal, potentially suggesting further  decrease in wall motion that is missed secondary to lack of echo-contrast.  Consider further imaging or additional studies if clinically indicated.   2. Right ventricular systolic function is normal. The right ventricular  size is normal.   3. The mitral valve is normal in structure. No evidence of mitral valve  regurgitation.   4. The aortic valve is tricuspid. There is mild calcification of the  aortic valve. There is mild thickening of the aortic valve. Aortic valve  regurgitation is not visualized. Mild aortic valve sclerosis is present,  with no evidence of aortic valve  stenosis.   5. The inferior vena cava is normal  in size with greater than 50%  respiratory variability, suggesting right atrial pressure of 3 mmHg.  Laboratory Data:  High Sensitivity Troponin:   Recent Labs  Lab 08/28/2021 1806 09/04/2021 2244 09/21/21 0807  TROPONINIHS 25* 481* 7,052*     Chemistry Recent Labs  Lab 09/17/2021 1806 09/03/2021 1823 09/13/2021 1923 09/21/21 0024 09/21/21 0807  NA 137 140 140 140 140  K 3.6 3.7 3.5 3.9 3.8  CL 110 110  --   --  116*  CO2 19*  --   --   --  17*  GLUCOSE 331* 312*  --   --  147*  BUN 38* 37*  --   --  37*  CREATININE 2.18* 2.20*  --   --  1.94*  CALCIUM 8.4*  --   --   --  8.4*  MG  --   --   --   --  2.0  GFRNONAA 29*  --   --   --  33*  ANIONGAP 8  --   --   --  7    Recent Labs  Lab 09/06/2021 1806  PROT 6.2*  ALBUMIN 3.4*  AST 35  ALT 36  ALKPHOS 90  BILITOT  0.7   Lipids  Recent Labs  Lab 09/21/21 0807  CHOL 147  TRIG 51  HDL 45  LDLCALC 92  CHOLHDL 3.3    Hematology Recent Labs  Lab 09/03/2021 1806 09/02/2021 1823 08/31/2021 1923 09/21/21 0024 09/21/21 0807  WBC 14.6*  --   --   --  12.7*  RBC 3.50*  --   --   --  3.13*  HGB 10.6*   < > 9.5* 9.5* 9.4*  HCT 32.4*   < > 28.0* 28.0* 28.1*  MCV 92.6  --   --   --  89.8  MCH 30.3  --   --   --  30.0  MCHC 32.7  --   --   --  33.5  RDW 14.1  --   --   --  14.0  PLT 198  --   --   --  151   < > = values in this interval not displayed.   Thyroid  Recent Labs  Lab 09/07/2021 2244  TSH 3.723  FREET4 0.78    BNPNo results for input(s): BNP, PROBNP in the last 168 hours.  DDimer No results for input(s): DDIMER in the last 168 hours.   Radiology/Studies:  CT HEAD WO CONTRAST (5MM)  Result Date: 09/24/2021 CLINICAL DATA:  Trauma. EXAM: CT HEAD WITHOUT CONTRAST CT CERVICAL SPINE WITHOUT CONTRAST TECHNIQUE: Multidetector CT imaging of the head and cervical spine was performed following the standard protocol without intravenous contrast. Multiplanar CT image reconstructions of the cervical spine were also  generated. COMPARISON:  CT dated 09/19/2011. FINDINGS: CT HEAD FINDINGS Brain: Moderate age-related atrophy and chronic microvascular ischemic changes. There is no acute intracranial hemorrhage. No mass effect or midline shift. No extra-axial fluid collection. Vascular: No hyperdense vessel or unexpected calcification. Skull: Normal. Negative for fracture or focal lesion. Sinuses/Orbits: The visualized paranasal sinuses and mastoid air cells are clear. Other: Left posterior scalp contusion. CT CERVICAL SPINE FINDINGS Alignment: No acute subluxation. Skull base and vertebrae: No acute fracture. Soft tissues and spinal canal: No prevertebral fluid or swelling. No visible canal hematoma. Disc levels: Multilevel degenerative changes with disc space narrowing and endplate irregularity and spurring. Upper chest: Partially visualized bilateral upper lobe and biapical pulmonary densities which may represent edema, pneumonia, or contusion. Other: An endotracheal tube and an enteric tube are partially visualized. Bilateral carotid bulb calcified plaques. IMPRESSION: 1. No acute intracranial pathology. Moderate age-related atrophy and chronic microvascular ischemic changes. 2. No acute/traumatic cervical spine pathology. Multilevel degenerative changes. 3. Partially visualized bilateral upper lobe and biapical pulmonary densities which may represent edema, pneumonia, or contusion. Electronically Signed   By: Anner Crete M.D.   On: 09/25/2021 21:42   CT Cervical Spine Wo Contrast  Result Date: 08/29/2021 CLINICAL DATA:  Trauma. EXAM: CT HEAD WITHOUT CONTRAST CT CERVICAL SPINE WITHOUT CONTRAST TECHNIQUE: Multidetector CT imaging of the head and cervical spine was performed following the standard protocol without intravenous contrast. Multiplanar CT image reconstructions of the cervical spine were also generated. COMPARISON:  CT dated 09/19/2011. FINDINGS: CT HEAD FINDINGS Brain: Moderate age-related atrophy and chronic  microvascular ischemic changes. There is no acute intracranial hemorrhage. No mass effect or midline shift. No extra-axial fluid collection. Vascular: No hyperdense vessel or unexpected calcification. Skull: Normal. Negative for fracture or focal lesion. Sinuses/Orbits: The visualized paranasal sinuses and mastoid air cells are clear. Other: Left posterior scalp contusion. CT CERVICAL SPINE FINDINGS Alignment: No acute subluxation. Skull base and vertebrae: No acute fracture. Soft tissues  and spinal canal: No prevertebral fluid or swelling. No visible canal hematoma. Disc levels: Multilevel degenerative changes with disc space narrowing and endplate irregularity and spurring. Upper chest: Partially visualized bilateral upper lobe and biapical pulmonary densities which may represent edema, pneumonia, or contusion. Other: An endotracheal tube and an enteric tube are partially visualized. Bilateral carotid bulb calcified plaques. IMPRESSION: 1. No acute intracranial pathology. Moderate age-related atrophy and chronic microvascular ischemic changes. 2. No acute/traumatic cervical spine pathology. Multilevel degenerative changes. 3. Partially visualized bilateral upper lobe and biapical pulmonary densities which may represent edema, pneumonia, or contusion. Electronically Signed   By: Anner Crete M.D.   On: 09/18/2021 21:42   DG Chest Port 1 View  Result Date: 09/10/2021 CLINICAL DATA:  Endotracheal tube and nasogastric tube placement. Cardiac arrest. EXAM: PORTABLE CHEST 1 VIEW COMPARISON:  April 14, 2011 FINDINGS: Endotracheal tube projects 6.2 cm above the carina. Nasogastric tube courses below the diaphragm with tip excluded by collimation. Cardiac defibrillator pads overlie the left chest and upper abdomen. The heart size and mediastinal contours are partially obscured but appear within normal limits. Left lung field is partially obscured by overlying defibrillator lead. Right lung appears clear. No visible  pleural effusion or pneumothorax. The visualized skeletal structures are unremarkable. IMPRESSION: 1. Endotracheal tube projects 6.2 cm above the carina. 2. Nasogastric tube courses below the diaphragm with tip excluded by collimation. Electronically Signed   By: Dahlia Bailiff M.D.   On: 09/13/2021 18:42     Assessment and Plan:   Transient complete heart block: Baseline ECG with right bundle branch block, left anterior fascicular block.  Home medications include Toprol-XL.  We will continue to hold this medication and all other AV nodal blockers.  As he continues to recover and gets extubated, will potentially need permanent pacemaker implant.  His metoprolol has been held.  We will continue to evaluate his rhythm and if he does not have further heart block, will need to continue to discuss the need for pacemaker implant.  We will continue to monitor rhythm.  Agree with atropine on standby and continuing pacing pads. Coronary artery disease: Noted elevated at 7052.  Patient was not complaining of chest pain.  His elevated troponin could be due to episode of heart block with CPR.  Despite that, he does have a wall motion abnormality noted on his transthoracic echo.  We will plan for left heart catheterization on Tuesday. Chronic diastolic heart failure: Currently not volume overloaded on exam.  Would monitor I's and O's.  Plan for echocardiogram this morning. Hyperlipidemia: Continue atorvastatin  Risk Assessment/Risk Scores:                For questions or updates, please contact [facility] Please consult [email or web address] for contact info under    Signed, Will Meredith Leeds, MD  09/21/2021 9:49 AM

## 2021-09-21 NOTE — Procedures (Signed)
Extubation Procedure Note  Patient Details:   Name: RANDE ROYLANCE DOB: 1933-08-13 MRN: [id number]   Airway Documentation:    Vent end date: 09/21/21 Vent end time: 1533   Evaluation  O2 sats: stable throughout Complications: No apparent complications Patient did tolerate procedure well. Bilateral Breath Sounds: Rhonchi, Diminished   Yes  Patient extubated per order to 4L [place] with no apparent complications. Positive cuff leak was noted prior to extubation. Patient is alert, has a strong cough, and is able to weakly speak. Vitals are stable. RT will continue to monitor.   Evaan Tidwell Clyda Greener 09/21/2021, 3:38 PM

## 2021-09-21 NOTE — Plan of Care (Signed)

## 2021-09-21 NOTE — H&P (Signed)
NAME:  Aaron Carlson, MRN:  [id number], DOB:  05/22/1933, LOS: 1 ADMISSION DATE:  09/19/2021, CONSULTATION DATE:  12/24 REFERRING MD:  Darl Householder, CHIEF COMPLAINT:  syncope   History of Present Illness:  Patient is encephalopathic and/or intubated. Therefore history has been obtained from chart review.   Aaron Carlson, is a 87 y.o. male, who presented to the [facility] ED with a chief complaint of syncope  They have a pertinent past medical history of CAD, combined systolic and diastolic HF, HTN, HLD, DM, CKD 3/4, GERD  Per documentation, patient had witnessed syncopal episode while at home, hit head on ground, reported unresponsive for 1 min. On arrival EMS found patient in complete heart block with rate of 30. Patient reported oriented x4. Atropine given, pacing initiated, 5mg  versed given. Per EDP patient removed pacing pads, HR decreased, and then became pulseless. CPR initiated with 3 rounds of CPR and 1 epi  He was intubated in the ED. Cardiology was consulted. CT head is pending. Trop 25. ECG: ST, RBBB, IVCD ,7.29/36/352.17.2. CXR with no infiltrate, pneumo, or effusion.   PCCM was consulted for admission.   Pertinent  Medical History  CAD, combined systolic and diastolic HF, HTN, HLD, DM, CKD 3/4, GERD  Significant Hospital Events: Including procedures, antibiotic start and stop dates in addition to other pertinent events   12/24 Syncope, EMS called, 3rd degree HB, paced, removed pads, asystole, CPR, intubated in ED. CT head>  Interim History / Subjective:   Awake and following commands.   Objective   Blood pressure 122/82, pulse 70, temperature 98.9 F (37.2 C), temperature source Oral, resp. rate 20, height 5\' 8"  (1.727 m), weight 91.9 kg, SpO2 100 %.    Vent Mode: PRVC FiO2 (%):  [40 %-100 %] 40 % Set Rate:  [20 bmp] 20 bmp Vt Set:  [540 mL] 540 mL PEEP:  [5 cmH20] 5 cmH20 Pressure Support:  [15 cmH20] 15 cmH20 Plateau Pressure:  [20 cmH20-22 cmH20] 20 cmH20   Intake/Output  Summary (Last 24 hours) at 09/21/2021 1131 Last data filed at 09/21/2021 1100 Gross per 24 hour  Intake 1447.77 ml  Output 400 ml  Net 1047.77 ml   Filed Weights   09/27/2021 2200  Weight: 91.9 kg    Examination: General: In bed, NAD, frail appearing HEENT: MM pink/moist, anicteric, atraumatic Neuro: RASS -1, PERRL 53mm, follows commands with toe wiggles and sticks tongue out. CV: S1S2, ST, no m/r/g appreciated PULM:  clear in the upper lobes, clear in the lower lobes, trachea midline, chest expansion symmetric GI: soft, bsx4 hypoactive, non-tender   Extremities: warm/dry, no pretibial edema, capillary refill less than 3 seconds  Skin:  no rashes or lesions noted   Resolved Hospital Problem list   WBC 12.7 HGB 9.4 BG 331 CO2 17 Trop 7052   ECG: ST, RBBB, IVCD CXR with no infiltrate, pneumo, or effusion.   Assessment & Plan:  Cardiac arrest- asystole, Bradycardic with EMS, paced, 3 rounds CPR with 1 epi. Third degree heart block- presented on admission  Syncope- suspect secondary to arrhythmia vs hypoperfusion NAGMA- suspect secondary to chronic renal disease Acute respiratory failure with hypoxia HX CAD- seen at [facility], managed medicaly  Hx systolic and diastolic CHF HX HLD  HX HTN Syncope/Fall DM2 CKD3-4 GERD  Plan:   - Intiate SBT - Diurese today - Lidocaine patch for chest pain.  - Will likely need PPM given CHB with underlying bifasicular block consistent with intrinsic conducting system disease.  - Suspect  demand related troponin rise given cardiac arrest.   Best Practice (right click and "Reselect all SmartList Selections" daily)   Diet/type: NPO w/ meds via tube DVT prophylaxis: SCD, plan pending GI prophylaxis: PPI Lines: N/A Foley:  Yes, and it is still needed Code Status:  full code Last date of multidisciplinary goals of care discussion [Full scope, discussed with Everlena Hickok at bedside on 12/24 ]  CRITICAL CARE Performed by: Kipp Brood   Total critical care time: 40 minutes  Critical care time was exclusive of separately billable procedures and treating other patients.  Critical care was necessary to treat or prevent imminent or life-threatening deterioration.  Critical care was time spent personally by me on the following activities: development of treatment plan with patient and/or surrogate as well as nursing, discussions with consultants, evaluation of patient's response to treatment, examination of patient, obtaining history from patient or surrogate, ordering and performing treatments and interventions, ordering and review of laboratory studies, ordering and review of radiographic studies, pulse oximetry, re-evaluation of patient's condition and participation in multidisciplinary rounds.  Kipp Brood, MD [facility] ICU Physician [place]  Pager: [phone] Mobile: [phone] After hours: [phone].

## 2021-09-21 NOTE — Progress Notes (Signed)
ANTICOAGULATION CONSULT NOTE - Initial Consult  Pharmacy Consult for heparin Indication:  ACS  Allergies  Allergen Reactions   Penicillins Rash    Patient Measurements: Height: 5\' 8"  (172.7 cm) Weight: 91.9 kg (202 lb 9.6 oz) IBW/kg (Calculated) : 68.4 Heparin Dosing Weight: 90kg  Vital Signs: Temp: 98.5 F (36.9 C) (12/25 1149) Temp Source: Oral (12/25 1149) BP: 122/82 (12/25 1100) Pulse Rate: 70 (12/25 1100)  Labs: Recent Labs    08/28/2021 1806 09/24/2021 1823 09/27/2021 1923 09/03/2021 2244 09/21/21 0024 09/21/21 0807 09/21/21 1025  HGB 10.6* 10.2* 9.5*  --  9.5* 9.4*  --   HCT 32.4* 30.0* 28.0*  --  28.0* 28.1*  --   PLT 198  --   --   --   --  151  --   HEPARINUNFRC  --   --   --   --   --   --  0.80*  CREATININE 2.18* 2.20*  --   --   --  1.94*  --   TROPONINIHS 25*  --   --  481*  --  7,052*  --      Estimated Creatinine Clearance: 30.1 mL/min (A) (by C-G formula based on SCr of 1.94 mg/dL (H)).   Medical History: Past Medical History:  Diagnosis Date   CAD in native artery 08/28/2013   Heaviness in chest with exertion. Coronary angiography 2005 demonstrated severe distal LAD and diagonal disease, severe distal RCA and PL OM disease, and severe first obtuse marginal disease. No high-grade proximal coronary disease was present at the time.  The most recent myocardial perfusion study in 2014 was nonischemic/low risk     Chronic renal insufficiency    Colon polyps    adenomatous   Diabetes mellitus    Hyperlipemia    Hypertension    Laryngeal papillomatosis    Prostate cancer (HCC)    w/ mets to bladder    Medications:  Medications Prior to Admission  Medication Sig Dispense Refill Last Dose   atorvastatin (LIPITOR) 20 MG tablet Take 20 mg by mouth at bedtime.   09/19/2021 at pm   benazepril (LOTENSIN) 20 MG tablet TAKE 1 TABLET EVERY DAY (Patient taking differently: Take 20 mg by mouth daily.) 90 tablet 3 09/10/2021   dorzolamide (TRUSOPT) 2 % ophthalmic  solution Place 1 drop into both eyes 2 (two) times daily.   09/19/2021   fluorometholone (FML) 0.1 % ophthalmic suspension Place 1 drop into the left eye 3 (three) times daily.      furosemide (LASIX) 80 MG tablet Take 80 mg by mouth daily.   09/06/2021 at am   isosorbide mononitrate (IMDUR) 60 MG 24 hr tablet Take 1 tablet (60 mg total) by mouth daily. 90 tablet 3 09/14/2021   ketorolac (ACULAR) 0.4 % SOLN Place 1 drop into the left eye 4 (four) times daily.   Past Week   latanoprost (XALATAN) 0.005 % ophthalmic solution Place 1 drop into both eyes at bedtime.   09/19/2021 at pm   metoprolol succinate (TOPROL XL) 25 MG 24 hr tablet Take 1 tablet (25 mg total) by mouth daily. 90 tablet 3 09/05/2021 at 0730   nitroGLYCERIN (NITROSTAT) 0.4 MG SL tablet Place 1 tablet (0.4 mg total) under the tongue every 5 (five) minutes as needed for chest pain. 25 tablet 3 unk   NON FORMULARY Take 1-2 capsules by mouth See admin instructions. Colon Clenz capsules- Take 1-2 capsules by mouth once a day as needed for constipation  unk   NOVOLIN N 100 UNIT/ML injection Inject 38 Units into the skin at bedtime.   09/19/2021 at pm   pantoprazole (PROTONIX) 40 MG tablet Take 40 mg by mouth daily as needed (for heartburn).   unk   potassium chloride (KLOR-CON) 10 MEQ tablet Take 10 mEq by mouth daily.   09/03/2021   prednisoLONE acetate (PRED FORTE) 1 % ophthalmic suspension Place 1 drop into both eyes at bedtime.   09/19/2021 at pm   SIMBRINZA 1-0.2 % SUSP Place 1 drop into both eyes 2 (two) times daily.   Past Week   simethicone (MYLICON) 384 MG chewable tablet Chew 125 mg by mouth every 6 (six) hours as needed for flatulence.   unk   Accu-Chek Softclix Lancets lancets       Alcohol Swabs 70 % PADS use when checking blood sugar      BD INSULIN SYRINGE ULTRAFINE 31G X 5/16" 0.5 ML MISC Use as directed      Blood Glucose Monitoring Suppl (TRUE METRIX METER) w/Device KIT       brimonidine (ALPHAGAN) 0.2 % ophthalmic  solution Place 1 drop into both eyes 2 (two) times daily.   See LF at unk   calcium carbonate (TUMS - DOSED IN MG ELEMENTAL CALCIUM) 500 MG chewable tablet Chew 1 tablet by mouth daily as needed for indigestion or heartburn.   unk   Continuous Blood Gluc Receiver (FREESTYLE LIBRE 2 READER) DEVI See admin instructions.      Continuous Blood Gluc Sensor (FREESTYLE LIBRE 2 SENSOR) MISC apply to upper back of arm   unk   fluticasone (FLONASE) 50 MCG/ACT nasal spray Place into both nostrils daily as needed for allergies or rhinitis.   unk   insulin lispro (HUMALOG) 100 UNIT/ML injection Use as directed daily at bedtime as directed per sliding scale.   See note at unk   Iron Combinations (CHROMAGEN) capsule Take 1 capsule by mouth daily.   unk   Latanoprostene Bunod (VYZULTA) 0.024 % SOLN 1 drop into affected eye in the evening   unk   MAGNESIUM PO Take 1 tablet by mouth daily.   unk   Multiple Vitamin (MULTIVITAMIN) tablet Take 1 tablet by mouth daily.   unk   niacin (NIASPAN) 500 MG CR tablet Take 500 mg by mouth at bedtime.   unk   niacin (SLO-NIACIN) 500 MG tablet 1 tablet with food   unk   Omega-3 Fatty Acids (FISH OIL) 1000 MG CAPS 1 capsule   unk   ONE TOUCH ULTRA TEST test strip Use as directed   N/A   Peppermint Oil (IBGARD) 90 MG CPCR 3-4 capsules   unk   Probiotic TBEC See admin instructions.      White Petrolatum-Mineral Oil (ARTIFICIAL TEARS) ointment Place into the left eye in the morning, at noon, and at bedtime.   unk   Scheduled:   acetaminophen  650 mg Oral Q4H   Or   acetaminophen (TYLENOL) oral liquid 160 mg/5 mL  650 mg Per Tube Q4H   Or   acetaminophen  650 mg Rectal Q4H   [START ON 09/22/2021] aspirin  81 mg Oral Daily   atorvastatin  20 mg Oral Daily   chlorhexidine gluconate (MEDLINE KIT)  15 mL Mouth Rinse BID   Chlorhexidine Gluconate Cloth  6 each Topical Daily   docusate  100 mg Per Tube BID   fentaNYL (SUBLIMAZE) injection  25 mcg Intravenous Once   insulin  aspart  0-15 Units  Subcutaneous Q4H   lidocaine  1 patch Transdermal Q24H   mouth rinse  15 mL Mouth Rinse 10 times per day   pantoprazole sodium  40 mg Per Tube QHS   polyethylene glycol  17 g Per Tube Daily   Infusions:   sodium chloride 10 mL/hr at 09/21/21 1100   epinephrine     fentaNYL infusion INTRAVENOUS 125 mcg/hr (09/21/21 1100)   heparin 1,250 Units/hr (09/21/21 1100)    Assessment: 87yo male arrived to ED via EMS in complete heart block, brady'd down to 10 BPM and lost pulses, ROSC after three rounds of CPR >> concern for ACS and started on heparin   Heparin drip 1250 uts/hr Heparin level 0.8 slightly > goal  CBC stable no bleeding noted  - will decrease rate   Goal of Therapy:  Heparin level 0.3-0.7 units/ml Monitor platelets by anticoagulation protocol: Yes   Plan:  Decrease heparin drip 1100 uts/hr  Daily heparin level and CBC    Bonnita Nasuti Pharm.D. CPP, BCPS Clinical Pharmacist [phone] 09/21/2021 12:34 PM

## 2021-09-21 NOTE — Progress Notes (Signed)
[facility] Progress Note Patient Name: Aaron Carlson DOB: 03/24/1933 MRN: [id number]   Date of Service  09/21/2021  HPI/Events of Note  Phos of 2.4 from 0800 and no replacement, was extubated today  Creatinine 1.94 trending down  eICU Interventions  Kphos 15 mmol ordered     Intervention Category Intermediate Interventions: Electrolyte abnormality - evaluation and management  Judd Lien 09/21/2021, 9:21 PM

## 2021-09-21 NOTE — Progress Notes (Signed)
ANTICOAGULATION CONSULT NOTE - Initial Consult  Pharmacy Consult for heparin Indication:  ACS  Allergies  Allergen Reactions   Penicillins Rash    Patient Measurements: Height: _0  (172.7 cm) Weight: 91.9 kg (202 lb 9.6 oz) IBW/kg (Calculated) : 68.4 Heparin Dosing Weight: 90kg  Vital Signs: Temp: 97.8 F (36.6 C) (12/24 2354) Temp Source: Axillary (12/24 2354) BP: 109/73 (12/25 0000) Pulse Rate: 104 (12/25 0000)  Labs: Recent Labs    09/17/2021 1806 09/06/2021 1823 09/07/2021 1923 09/12/2021 2244 09/21/21 0024  HGB 10.6* 10.2* 9.5*  --  9.5*  HCT 32.4* 30.0* 28.0*  --  28.0*  PLT 198  --   --   --   --   CREATININE 2.18* 2.20*  --   --   --   TROPONINIHS 25*  --   --  481*  --     Estimated Creatinine Clearance: 26.5 mL/min (A) (by C-G formula based on SCr of 2.2 mg/dL (H)).   Medical History: Past Medical History:  Diagnosis Date   CAD in native artery 08/28/2013   Heaviness in chest with exertion. Coronary angiography 2005 demonstrated severe distal LAD and diagonal disease, severe distal RCA and PL OM disease, and severe first obtuse marginal disease. No high-grade proximal coronary disease was present at the time.  The most recent myocardial perfusion study in 2014 was nonischemic/low risk     Chronic renal insufficiency    Colon polyps    adenomatous   Diabetes mellitus    Hyperlipemia    Hypertension    Laryngeal papillomatosis    Prostate cancer ([place])    w/ mets to bladder    Medications:  Medications Prior to Admission  Medication Sig Dispense Refill Last Dose   atorvastatin (LIPITOR) 20 MG tablet Take 20 mg by mouth at bedtime.   09/19/2021 at pm   benazepril (LOTENSIN) 20 MG tablet TAKE 1 TABLET EVERY DAY (Patient taking differently: Take 20 mg by mouth daily.) 90 tablet 3 09/24/2021   dorzolamide (TRUSOPT) 2 % ophthalmic solution Place 1 drop into both eyes 2 (two) times daily.   09/19/2021   fluorometholone (FML) 0.1 % ophthalmic suspension Place 1  drop into the left eye 3 (three) times daily.      furosemide (LASIX) 80 MG tablet Take 80 mg by mouth daily.   09/16/2021 at am   isosorbide mononitrate (IMDUR) 60 MG 24 hr tablet Take 1 tablet (60 mg total) by mouth daily. 90 tablet 3 08/28/2021   ketorolac (ACULAR) 0.4 % SOLN Place 1 drop into the left eye 4 (four) times daily.   Past Week   latanoprost (XALATAN) 0.005 % ophthalmic solution Place 1 drop into both eyes at bedtime.   09/19/2021 at pm   metoprolol succinate (TOPROL XL) 25 MG 24 hr tablet Take 1 tablet (25 mg total) by mouth daily. 90 tablet 3 09/05/2021 at 0730   nitroGLYCERIN (NITROSTAT) 0.4 MG SL tablet Place 1 tablet (0.4 mg total) under the tongue every 5 (five) minutes as needed for chest pain. 25 tablet 3 unk   NON FORMULARY Take 1-2 capsules by mouth See admin instructions. Colon Clenz capsules- Take 1-2 capsules by mouth once a day as needed for constipation   unk   NOVOLIN N 100 UNIT/ML injection Inject 38 Units into the skin at bedtime.   09/19/2021 at pm   pantoprazole (PROTONIX) 40 MG tablet Take 40 mg by mouth daily as needed (for heartburn).   unk   potassium chloride (  KLOR-CON) 10 MEQ tablet Take 10 mEq by mouth daily.   09/08/2021   prednisoLONE acetate (PRED FORTE) 1 % ophthalmic suspension Place 1 drop into both eyes at bedtime.   09/19/2021 at pm   SIMBRINZA 1-0.2 % SUSP Place 1 drop into both eyes 2 (two) times daily.   Past Week   simethicone (MYLICON) 827 MG chewable tablet Chew 125 mg by mouth every 6 (six) hours as needed for flatulence.   unk   Accu-Chek Softclix Lancets lancets       Alcohol Swabs 70 % PADS use when checking blood sugar      BD INSULIN SYRINGE ULTRAFINE 31G X 5/16" 0.5 ML MISC Use as directed      Blood Glucose Monitoring Suppl (TRUE METRIX METER) w/Device KIT       brimonidine (ALPHAGAN) 0.2 % ophthalmic solution Place 1 drop into both eyes 2 (two) times daily.   See LF at unk   calcium carbonate (TUMS - DOSED IN MG ELEMENTAL CALCIUM)  500 MG chewable tablet Chew 1 tablet by mouth daily as needed for indigestion or heartburn.   unk   Continuous Blood Gluc Receiver (FREESTYLE LIBRE 2 READER) DEVI See admin instructions.      Continuous Blood Gluc Sensor (FREESTYLE LIBRE 2 SENSOR) MISC apply to upper back of arm   unk   fluticasone (FLONASE) 50 MCG/ACT nasal spray Place into both nostrils daily as needed for allergies or rhinitis.   unk   insulin lispro (HUMALOG) 100 UNIT/ML injection Use as directed daily at bedtime as directed per sliding scale.   See note at unk   Iron Combinations (CHROMAGEN) capsule Take 1 capsule by mouth daily.   unk   Latanoprostene Bunod (VYZULTA) 0.024 % SOLN 1 drop into affected eye in the evening   unk   MAGNESIUM PO Take 1 tablet by mouth daily.   unk   Multiple Vitamin (MULTIVITAMIN) tablet Take 1 tablet by mouth daily.   unk   niacin (NIASPAN) 500 MG CR tablet Take 500 mg by mouth at bedtime.   unk   niacin (SLO-NIACIN) 500 MG tablet 1 tablet with food   unk   Omega-3 Fatty Acids (FISH OIL) 1000 MG CAPS 1 capsule   unk   ONE TOUCH ULTRA TEST test strip Use as directed   N/A   Peppermint Oil (IBGARD) 90 MG CPCR 3-4 capsules   unk   Probiotic TBEC See admin instructions.      White Petrolatum-Mineral Oil (ARTIFICIAL TEARS) ointment Place into the left eye in the morning, at noon, and at bedtime.   unk   Scheduled:   acetaminophen  650 mg Oral Q4H   Or   acetaminophen (TYLENOL) oral liquid 160 mg/5 mL  650 mg Per Tube Q4H   Or   acetaminophen  650 mg Rectal Q4H   [START ON 09/22/2021] aspirin  81 mg Oral Daily   aspirin  325 mg Oral Once   atorvastatin  20 mg Oral Daily   chlorhexidine gluconate (MEDLINE KIT)  15 mL Mouth Rinse BID   Chlorhexidine Gluconate Cloth  6 each Topical Daily   docusate  100 mg Per Tube BID   fentaNYL (SUBLIMAZE) injection  25 mcg Intravenous Once   insulin aspart  0-15 Units Subcutaneous Q4H   mouth rinse  15 mL Mouth Rinse 10 times per day   pantoprazole  (PROTONIX) IV  40 mg Intravenous QHS   polyethylene glycol  17 g Per Tube Daily  Infusions:   sodium chloride 10 mL/hr at 09/25/2021 2200   epinephrine     fentaNYL infusion INTRAVENOUS 100 mcg/hr (09/14/2021 2200)    Assessment: 87yo male arrived to ED via EMS in complete heart block, brady'd down to 10 BPM and lost pulses, ROSC after three rounds of CPR >> concern for ACS, to begin heparin.  Goal of Therapy:  Heparin level 0.3-0.7 units/ml Monitor platelets by anticoagulation protocol: Yes   Plan:  Heparin 3000 units IV bolus followed by infusion at 1250 units/hr and monitor heparin levels and CBC.  Wynona Neat, PharmD, BCPS  09/21/2021,1:36 AM

## 2021-09-22 ENCOUNTER — Inpatient Hospital Stay (HOSPITAL_COMMUNITY): Payer: Medicare HMO

## 2021-09-22 DIAGNOSIS — I442 Atrioventricular block, complete: Secondary | ICD-10-CM | POA: Diagnosis not present

## 2021-09-22 DIAGNOSIS — I469 Cardiac arrest, cause unspecified: Secondary | ICD-10-CM | POA: Diagnosis not present

## 2021-09-22 DIAGNOSIS — I619 Nontraumatic intracerebral hemorrhage, unspecified: Secondary | ICD-10-CM | POA: Insufficient documentation

## 2021-09-22 DIAGNOSIS — I495 Sick sinus syndrome: Secondary | ICD-10-CM

## 2021-09-22 LAB — CBC
HCT: 29.6 % — ABNORMAL LOW (ref 39.0–52.0)
Hemoglobin: 9.9 g/dL — ABNORMAL LOW (ref 13.0–17.0)
MCH: 30.5 pg (ref 26.0–34.0)
MCHC: 33.4 g/dL (ref 30.0–36.0)
MCV: 91.1 fL (ref 80.0–100.0)
Platelets: 155 10*3/uL (ref 150–400)
RBC: 3.25 MIL/uL — ABNORMAL LOW (ref 4.22–5.81)
RDW: 14.4 % (ref 11.5–15.5)
WBC: 13.2 10*3/uL — ABNORMAL HIGH (ref 4.0–10.5)
nRBC: 0 % (ref 0.0–0.2)

## 2021-09-22 LAB — BASIC METABOLIC PANEL
Anion gap: 8 (ref 5–15)
BUN: 40 mg/dL — ABNORMAL HIGH (ref 8–23)
CO2: 15 mmol/L — ABNORMAL LOW (ref 22–32)
Calcium: 8.3 mg/dL — ABNORMAL LOW (ref 8.9–10.3)
Chloride: 117 mmol/L — ABNORMAL HIGH (ref 98–111)
Creatinine, Ser: 1.97 mg/dL — ABNORMAL HIGH (ref 0.61–1.24)
GFR, Estimated: 32 mL/min — ABNORMAL LOW (ref >60)
Glucose, Bld: 198 mg/dL — ABNORMAL HIGH (ref 70–99)
Potassium: 3.5 mmol/L (ref 3.5–5.1)
Sodium: 140 mmol/L (ref 135–145)

## 2021-09-22 LAB — GLUCOSE, CAPILLARY
Glucose-Capillary: 139 mg/dL — ABNORMAL HIGH (ref 70–99)
Glucose-Capillary: 149 mg/dL — ABNORMAL HIGH (ref 70–99)
Glucose-Capillary: 153 mg/dL — ABNORMAL HIGH (ref 70–99)
Glucose-Capillary: 157 mg/dL — ABNORMAL HIGH (ref 70–99)
Glucose-Capillary: 172 mg/dL — ABNORMAL HIGH (ref 70–99)
Glucose-Capillary: 182 mg/dL — ABNORMAL HIGH (ref 70–99)
Glucose-Capillary: 195 mg/dL — ABNORMAL HIGH (ref 70–99)

## 2021-09-22 LAB — PROTIME-INR
INR: 1.2 (ref 0.8–1.2)
Prothrombin Time: 15 seconds (ref 11.4–15.2)

## 2021-09-22 LAB — HEPARIN LEVEL (UNFRACTIONATED): Heparin Unfractionated: 0.6 IU/mL (ref 0.30–0.70)

## 2021-09-22 LAB — MAGNESIUM: Magnesium: 1.9 mg/dL (ref 1.7–2.4)

## 2021-09-22 LAB — APTT: aPTT: 32 seconds (ref 24–36)

## 2021-09-22 LAB — PHOSPHORUS: Phosphorus: 5 mg/dL — ABNORMAL HIGH (ref 2.5–4.6)

## 2021-09-22 MED ORDER — ATORVASTATIN CALCIUM 40 MG PO TABS
40.0000 mg | ORAL_TABLET | Freq: Every day | ORAL | Status: DC
  Administered 2021-09-22 – 2021-09-23 (×2): 40 mg via ORAL
  Filled 2021-09-22 (×2): qty 1

## 2021-09-22 MED ORDER — ACETAMINOPHEN 160 MG/5ML PO SOLN
650.0000 mg | ORAL | Status: DC | PRN
  Administered 2021-09-26: 04:00:00 650 mg
  Filled 2021-09-22 (×2): qty 20.3

## 2021-09-22 MED ORDER — GADOBUTROL 1 MMOL/ML IV SOLN
9.0000 mL | Freq: Once | INTRAVENOUS | Status: AC | PRN
  Administered 2021-09-22: 11:00:00 9 mL via INTRAVENOUS

## 2021-09-22 MED ORDER — MAGNESIUM SULFATE 2 GM/50ML IV SOLN
2.0000 g | Freq: Once | INTRAVENOUS | Status: AC
  Administered 2021-09-22: 05:00:00 2 g via INTRAVENOUS
  Filled 2021-09-22: qty 50

## 2021-09-22 MED ORDER — SODIUM CHLORIDE 0.9 % IV SOLN
6.2500 mg | Freq: Once | INTRAVENOUS | Status: DC
  Filled 2021-09-22: qty 0.25

## 2021-09-22 MED ORDER — POTASSIUM CHLORIDE CRYS ER 20 MEQ PO TBCR: 20.0000 meq | EXTENDED_RELEASE_TABLET | Freq: Once | ORAL | Status: DC

## 2021-09-22 MED ORDER — SODIUM CHLORIDE 0.9 % IV SOLN: INTRAVENOUS | Status: DC

## 2021-09-22 MED ORDER — STROKE: EARLY STAGES OF RECOVERY BOOK
Freq: Once | Status: DC
  Filled 2021-09-22: qty 1

## 2021-09-22 MED ORDER — HYDRALAZINE HCL 20 MG/ML IJ SOLN: 10.0000 mg | INTRAMUSCULAR | Status: DC | PRN

## 2021-09-22 MED ORDER — ACETAMINOPHEN 325 MG PO TABS: 650.0000 mg | ORAL_TABLET | ORAL | Status: DC | PRN

## 2021-09-22 MED ORDER — GADOBUTROL 1 MMOL/ML IV SOLN: 9.0000 mL | Freq: Once | INTRAVENOUS | Status: DC | PRN

## 2021-09-22 MED ORDER — POTASSIUM CHLORIDE CRYS ER 20 MEQ PO TBCR
20.0000 meq | EXTENDED_RELEASE_TABLET | Freq: Once | ORAL | Status: DC
  Filled 2021-09-22: qty 1

## 2021-09-22 MED ORDER — PROTAMINE SULFATE 10 MG/ML IV SOLN
50.0000 mg | Freq: Once | INTRAVENOUS | Status: AC
  Administered 2021-09-22: 05:00:00 50 mg via INTRAVENOUS
  Filled 2021-09-22: qty 5

## 2021-09-22 MED ORDER — ACETAMINOPHEN 650 MG RE SUPP: 650.0000 mg | RECTAL | Status: DC | PRN

## 2021-09-22 MED ORDER — SENNOSIDES-DOCUSATE SODIUM 8.6-50 MG PO TABS
1.0000 | ORAL_TABLET | Freq: Two times a day (BID) | ORAL | Status: DC
  Administered 2021-09-22 – 2021-09-23 (×2): 1 via ORAL
  Filled 2021-09-22 (×2): qty 1

## 2021-09-22 MED ORDER — CHLORHEXIDINE GLUCONATE CLOTH 2 % EX PADS
6.0000 | MEDICATED_PAD | Freq: Every day | CUTANEOUS | Status: DC
  Administered 2021-09-22 – 2021-09-24 (×2): 6 via TOPICAL

## 2021-09-22 NOTE — Progress Notes (Signed)
PT Cancellation Note  Patient Details Name: Aaron Carlson MRN: [id number] DOB: March 10, 1933   Cancelled Treatment:    Reason Eval/Treat Not Completed: Medical issues which prohibited therapy;Other (comment) spoke with RN who states the pt is awaiting follow-up CT this morning after Code Stroke overnight. Will continue to follow and evaluate when appropriate.   West Carbo, PT, DPT   Acute Rehabilitation Department Pager #: [phone]   Sandra Cockayne 09/22/2021, 9:31 AM

## 2021-09-22 NOTE — Evaluation (Addendum)
Speech Language Pathology Evaluation Patient Details Name: Aaron Carlson MRN: [id number] DOB: 1933-07-10 Today's Date: 09/22/2021 Time: 1856-3149 SLP Time Calculation (min) (ACUTE ONLY): 23 min  Problem List:  Patient Active Problem List   Diagnosis Date Noted   Intracerebral hemorrhage 09/22/2021   Cardiac arrest ([place]) 09/11/2021   Third degree heart block ([place])    Acute respiratory failure with hypoxia ([place])    Posterior subcapsular age-related cataract, right eye 09/03/2021   Abdominal aortic aneurysm without rupture 08/12/2021   Pure hypercholesterolemia 08/12/2021   Cystoid macular edema, left eye 12/04/2020   Vitreomacular traction syndrome of left eye 12/04/2020   Primary open angle glaucoma of left eye, severe stage 12/04/2020   Diabetes mellitus without complication ([place]) 70/26/3785   Left epiretinal membrane 12/04/2020   Serous retinal detachment, left eye 12/04/2020   Anemia 11/29/2020   Diabetic renal disease ([place]) 11/29/2020   Glaucoma 11/29/2020   Hyperglycemia due to type 2 diabetes mellitus ([place]) 11/29/2020   History of malignant neoplasm of prostate 11/29/2020   Long term (current) use of insulin ([place]) 11/29/2020   Morbid obesity ([place]) 11/29/2020   Flatulence/gas pain/belching 12/12/2018   Gastritis 12/12/2018   Chronic diastolic heart failure ([place]) 09/09/2016   CKD (chronic kidney disease) stage 3, GFR 30-59 ml/min (HCC) 08/11/2016   RBBB 12/28/2013   Coronary artery disease involving native coronary artery of native heart with angina pectoris ([place]) 08/28/2013    Class: Chronic   RBBB (right bundle branch block with left anterior fascicular block) 08/28/2013   Hyperlipidemia 08/28/2013    Class: Chronic   Papilloma of larynx 08/26/2011   IDDM 02/01/2007   Essential hypertension 02/01/2007   RHINITIS, ALLERGIC NOS 02/01/2007   Past Medical History:  Past Medical History:  Diagnosis Date   CAD in native artery 08/28/2013   Heaviness in chest with  exertion. Coronary angiography 2005 demonstrated severe distal LAD and diagonal disease, severe distal RCA and PL OM disease, and severe first obtuse marginal disease. No high-grade proximal coronary disease was present at the time.  The most recent myocardial perfusion study in 2014 was nonischemic/low risk     Chronic renal insufficiency    Colon polyps    adenomatous   Diabetes mellitus    Hyperlipemia    Hypertension    Laryngeal papillomatosis    Prostate cancer ([place])    w/ mets to bladder   Past Surgical History:  Past Surgical History:  Procedure Laterality Date   BLADDER SURGERY     CATARACT EXTRACTION Left 02/2020   CIRCUMCISION  2011   POLYPECTOMY     vocal cords   PROSTATE SURGERY     HPI:  Pt is an 87 yo male presenting with syncope and found unresponsive. Pt went into PEA arrest requiring CPR x3 minutes with ROSC. ETT 12/24-12/25. Pt initially passed a yale swallow screen post-extubation but then a code stroke was called in the early morning on 12/26 due to R sided weakness and slurred speech. CTH on admission negative for acute changes but repeat on 12/26 revealed acute L sided ICH. MRI with considerable motion degradation but redmonstrates intraparenchymal hematoma at the L frontoparietal junction with surrounding edema. PMH significant for recurrent laryngeal papillomatosis s/p laser excision, CAD, DM2, HTN, HLD, Prostrate Cancer   Assessment / Plan / Recommendation Clinical Impression  Pt has a moderate dysarthria, impacting intelligibility of speech starting at the phrase level, and primarily c/b imprecise articulation, low volume, and mildly slowed rate. Receptive and expressive language are  relatively intact. Cognitively he does demonstrate mild difficulties with delayed recall. He will benefit from ongoing SLP services to maximize intelligibility of speech and memory, with further assessment needed to address differential diagnosis of higher level cognitive skills as  well. SLP provided brief education about initial use of speech strategy to facilitate communication.    SLP Assessment  SLP Recommendation/Assessment: Patient needs continued Speech [place] [facility] SLP Visit Diagnosis: Dysarthria and anarthria (R47.1)    Recommendations for follow up therapy are one component of a multi-disciplinary discharge planning process, led by the attending physician.  Recommendations may be updated based on patient status, additional functional criteria and insurance authorization.    Follow Up Recommendations  Acute inpatient rehab (3hours/day)    Assistance Recommended at Discharge     Functional Status Assessment Patient has had a recent decline in their functional status and demonstrates the ability to make significant improvements in function in a reasonable and predictable amount of time.  Frequency and Duration min 2x/week         SLP Evaluation Cognition  Overall Cognitive Status: Impaired/Different from baseline Arousal/Alertness: Awake/alert Orientation Level: Oriented X4 Attention: Sustained Sustained Attention: Appears intact Memory: Impaired Memory Impairment: Retrieval deficit;Decreased recall of new information Awareness: Appears intact Safety/Judgment: Appears intact       Comprehension  Auditory Comprehension Overall Auditory Comprehension: Appears within functional limits for tasks assessed    Expression Expression Primary Mode of Expression: Verbal Verbal Expression Overall Verbal Expression: Appears within functional limits for tasks assessed   Oral / Motor  Oral Motor/Sensory Function Overall Oral Motor/Sensory Function: Severe impairment Facial ROM: Reduced right;Suspected CN VII (facial) dysfunction Facial Symmetry: Abnormal symmetry right;Suspected CN VII (facial) dysfunction Facial Strength: Reduced right;Suspected CN VII (facial) dysfunction Facial Sensation: Reduced right;Suspected CN V (Trigeminal)  dysfunction Lingual ROM: Reduced right;Suspected CN XII (hypoglossal) dysfunction Lingual Symmetry: Abnormal symmetry right;Suspected CN XII (hypoglossal) dysfunction Lingual Strength: Reduced;Suspected CN XII (hypoglossal) dysfunction Velum: Within Functional Limits Motor Speech Overall Motor Speech: Impaired Respiration: Within functional limits Phonation: Low vocal intensity Resonance: Within functional limits Articulation: Impaired Level of Impairment: Word Intelligibility: Intelligibility reduced Word: 75-100% accurate Phrase: 75-100% accurate Sentence: 50-74% accurate Motor Planning: Witnin functional limits            Osie Bond., M.A. [facility] Acute Rehabilitation Services Pager [phone] Office [phone]  09/22/2021, 2:09 PM

## 2021-09-22 NOTE — Progress Notes (Addendum)
STROKE TEAM PROGRESS NOTE   INTERVAL HISTORY His granddaughter is at the bedside.  MRI this morning shows a stable hematoma. Patient is working with speech therapy. Plan for repeat CT scan in the morning. Discussed MRI results with family, RN, and CCM physician    Vitals:   09/22/21 0615 09/22/21 0630 09/22/21 0645 09/22/21 0700  BP: (!) 127/58 (!) 127/53 (!) 132/57 123/65  Pulse: 83 83 84 83  Resp: 20 20 20  (!) 21  Temp:      TempSrc:      SpO2: 99% 99% 98% 100%  Weight:      Height:       CBC:  Recent Labs  Lab 09/06/2021 1806 09/16/2021 1823 09/21/21 0807 09/22/21 0133  WBC 14.6*  --  12.7* 13.2*  NEUTROABS 8.2*  --   --   --   HGB 10.6*   < > 9.4* 9.9*  HCT 32.4*   < > 28.1* 29.6*  MCV 92.6  --  89.8 91.1  PLT 198  --  151 155   < > = values in this interval not displayed.   Basic Metabolic Panel:  Recent Labs  Lab 09/21/21 0807 09/22/21 0133  NA 140 140  K 3.8 3.5  CL 116* 117*  CO2 17* 15*  GLUCOSE 147* 198*  BUN 37* 40*  CREATININE 1.94* 1.97*  CALCIUM 8.4* 8.3*  MG 2.0 1.9  PHOS 2.4* 5.0*   Lipid Panel:  Recent Labs  Lab 09/21/21 0807  CHOL 147  TRIG 51  HDL 45  CHOLHDL 3.3  VLDL 10  LDLCALC 92   HgbA1c:  Recent Labs  Lab 09/07/2021 2244  HGBA1C 8.3*   Urine Drug Screen: No results for input(s): LABOPIA, COCAINSCRNUR, LABBENZ, AMPHETMU, THCU, LABBARB in the last 168 hours.  Alcohol Level No results for input(s): ETH in the last 168 hours.  IMAGING past 24 hours ECHOCARDIOGRAM COMPLETE  Result Date: 09/21/2021    ECHOCARDIOGRAM REPORT   Patient Name:   ELDRA WORD Date of Exam: 09/21/2021 Medical Rec #:  [id number]       Height:       68.0 in Accession #:    [id number]      Weight:       202.6 lb Date of Birth:  [birth year]       BSA:          2.055 m Patient Age:    87 years        BP:           105/53 mmHg Patient Gender: M               HR:           73 bpm. Exam Location:  Inpatient Procedure: 2D Echo Indications:     cardiac arrest   History:         Patient has prior history of Echocardiogram examinations, most                  recent 12/06/2020. CAD; Risk Factors:Diabetes and Hypertension.  Sonographer:     Johny Chess RDCS Referring Phys:  [id number] Estill Cotta Diagnosing Phys: Glori Bickers MD IMPRESSIONS  1. Left ventricular ejection fraction, by estimation, is 50 to 55%. The left ventricle has low normal function. The left ventricle has no regional wall motion abnormalities. Left ventricular diastolic parameters are consistent with Grade I diastolic dysfunction (impaired relaxation). There is hypokinesis of the left ventricular, basal-mid inferior wall  and inferolateral wall.  2. Right ventricular systolic function is normal. The right ventricular size is normal. There is normal pulmonary artery systolic pressure.  3. Left atrial size was mildly dilated.  4. The mitral valve is normal in structure. No evidence of mitral valve regurgitation. No evidence of mitral stenosis.  5. The aortic valve has an indeterminant number of cusps. There is moderate calcification of the aortic valve. Aortic valve regurgitation is not visualized. Aortic valve sclerosis/calcification is present, without any evidence of aortic stenosis.  6. The inferior vena cava is normal in size with greater than 50% respiratory variability, suggesting right atrial pressure of 3 mmHg. FINDINGS  Left Ventricle: Left ventricular ejection fraction, by estimation, is 50 to 55%. The left ventricle has low normal function. The left ventricle has no regional wall motion abnormalities. The left ventricular internal cavity size was normal in size. There is no left ventricular hypertrophy. Left ventricular diastolic parameters are consistent with Grade I diastolic dysfunction (impaired relaxation). Right Ventricle: The right ventricular size is normal. No increase in right ventricular wall thickness. Right ventricular systolic function is normal. There is normal pulmonary  artery systolic pressure. The tricuspid regurgitant velocity is 2.08 m/s, and  with an assumed right atrial pressure of 3 mmHg, the estimated right ventricular systolic pressure is 32.6 mmHg. Left Atrium: Left atrial size was mildly dilated. Right Atrium: Right atrial size was normal in size. Pericardium: There is no evidence of pericardial effusion. Mitral Valve: The mitral valve is normal in structure. No evidence of mitral valve regurgitation. No evidence of mitral valve stenosis. Tricuspid Valve: The tricuspid valve is normal in structure. Tricuspid valve regurgitation is not demonstrated. No evidence of tricuspid stenosis. Aortic Valve: The aortic valve has an indeterminant number of cusps. There is moderate calcification of the aortic valve. Aortic valve regurgitation is not visualized. Aortic valve sclerosis/calcification is present, without any evidence of aortic stenosis. Pulmonic Valve: The pulmonic valve was normal in structure. Pulmonic valve regurgitation is not visualized. No evidence of pulmonic stenosis. Aorta: The aortic root is normal in size and structure. Venous: The inferior vena cava is normal in size with greater than 50% respiratory variability, suggesting right atrial pressure of 3 mmHg. IAS/Shunts: No atrial level shunt detected by color flow Doppler.  LEFT VENTRICLE PLAX 2D LVIDd:         3.80 cm   Diastology LVIDs:         2.30 cm   LV e' medial:    4.68 cm/s LV PW:         1.00 cm   LV E/e' medial:  12.0 LV IVS:        1.10 cm   LV e' lateral:   5.66 cm/s LVOT diam:     1.90 cm   LV E/e' lateral: 9.9 LV SV:         43 LV SV Index:   21 LVOT Area:     2.84 cm  RIGHT VENTRICLE            IVC RV S prime:     6.20 cm/s  IVC diam: 1.50 cm TAPSE (M-mode): 1.5 cm LEFT ATRIUM             Index        RIGHT ATRIUM          Index LA diam:        2.50 cm 1.22 cm/m   RA Area:     9.71 cm LA Vol (  A2C):   44.2 ml 21.51 ml/m  RA Volume:   21.20 ml 10.32 ml/m LA Vol (A4C):   19.7 ml 9.59 ml/m LA  Biplane Vol: 32.0 ml 15.57 ml/m  AORTIC VALVE LVOT Vmax:   85.90 cm/s LVOT Vmean:  53.400 cm/s LVOT VTI:    0.152 m  AORTA Ao Root diam: 2.90 cm Ao Asc diam:  3.10 cm MITRAL VALVE               TRICUSPID VALVE MV Area (PHT): 3.12 cm    TR Peak grad:   17.3 mmHg MV Decel Time: 243 msec    TR Vmax:        208.00 cm/s MV E velocity: 56.20 cm/s MV A velocity: 66.30 cm/s  SHUNTS MV E/A ratio:  0.85        Systemic VTI:  0.15 m                            Systemic Diam: 1.90 cm Glori Bickers MD Electronically signed by Glori Bickers MD Signature Date/Time: 09/21/2021/3:01:56 PM    Final (Updated)    CT HEAD CODE STROKE WO CONTRAST  Result Date: 09/22/2021 CLINICAL DATA:  Code stroke.  87 year old male. EXAM: CT HEAD WITHOUT CONTRAST TECHNIQUE: Contiguous axial images were obtained from the base of the skull through the vertex without intravenous contrast. COMPARISON:  Head CT 09/14/2021. FINDINGS: Brain: Rounded intra-axial mixed density hemorrhage encompasses 42 by 30 by 36 mm (AP by transverse by CC) and there is a small volume of extra-axial extension of blood into the regional subarachnoid space. This is centered in the left perirolandic region, posterior left frontal and anterior parietal lobes just above the posterior operculum. There is surrounding edema. Mild regional mass effect. But no midline shift. No intraventricular extension of blood identified. No ventriculomegaly. Basilar cisterns remain patent and normal. No superimposed evidence of cortically based acute infarction. Gray-white matter differentiation outside of the area of acute hemorrhage is stable from 2 days ago. Vascular: Calcified atherosclerosis at the skull base. No suspicious intracranial vascular hyperdensity. Skull: No skull fracture identified. Sinuses/Orbits: Visualized paranasal sinuses and mastoids are stable and well aerated. Other: Broad-based left posterior convexity scalp hematoma. No scalp soft tissue gas. Underlying  calvarium appears stable and intact. Orbits appear stable, negative. ASPECTS [facility] Stroke Program Early CT Score) - Ganglionic level infarction (caudate, lentiform nuclei, internal capsule, insula, M1-M3 cortex): - Supraganglionic infarction (M4-M6 cortex): Total score (0-10 with 10 being normal): IMPRESSION: 1. Acute left side 23 mL intra-axial and perirolandic hemorrhage with small volume extension of subarachnoid blood. Regional edema, but no midline shift. No IVH or ventriculomegaly. 2. Left posterior convexity scalp hematoma without underlying skull fracture. 3. No other acute intracranial abnormality. 4. These results were communicated to Dr. Lorrin Goodell at 4:36 am on 09/22/2021 by text page via the [facility] messaging system. Electronically Signed   By: Genevie Ann M.D.   On: 09/22/2021 04:37    PHYSICAL EXAM  Temp:  [97.7 F (36.5 C)-98.5 F (36.9 C)] 98.2 F (36.8 C) (12/26 1200) Pulse Rate:  [71-103] 85 (12/26 1300) Resp:  [17-32] 22 (12/26 1300) BP: (101-148)/(53-113) 143/63 (12/26 1300) SpO2:  [93 %-100 %] 96 % (12/26 1300) FiO2 (%):  [36 %] 36 % (12/25 1535)  General - Well nourished, well developed, in no apparent distress. Ophthalmologic - fundi not visualized due to noncooperation. Cardiovascular - Regular rhythm and rate.  Mental Status -  Level  of arousal and orientation to time, place, and person were intact. Mild dysarthria, no aphasia. Follows all commands.  Attention span and concentration were normal. Recent and remote memory were intact. Fund of Knowledge was assessed and was intact.  Cranial Nerves II - XII - II - Visual field intact OU. III, IV, VI - Right gaze incomplete, left gaze preference, however he is able to cross midline. V - sensation is absent on the right face, arm, and leg VII - right facial droop VIII - Hearing & vestibular intact bilaterally. X - Palate elevates symmetrically. XI - Chin turning & shoulder shrug intact bilaterally. XII - Tongue  protrusion to the left  Motor Strength -  LUE 5/5, no drift present. RUE 3+/5 proximal and distally. Bilaterally LEs 4/5 proximal and 5/5 distally. Motor Tone - Muscle tone was assessed at the neck and appendages and was normal. Reflexes - The patients reflexes were symmetrical in all extremities and he had no pathological reflexes. Sensory - Sensation diminished on the right side.   Coordination - FTN intact on the left, right side limited by weakness Gait and Station - deferred.   ASSESSMENT/PLAN Mr. EVERETTE MALL is a 87 y.o. male with history of CAD, combined systolic and diastolic heart failure, HTN, HLD< DM, CKD 3/4, and GERD presenting initially with syncope and cardiac arrest. A heparin gtt was initiated on 12/24 per ACS protocol. He developed a facial droop, slurred speech, and RUE weakness on 12/26 and a code stroke was activated. Head CT showed a L MCA territory 22ml ICH with a small volume extension of subarachnoid blood. MRI shows stable hematoma in the left frontoparietal junction.   ICH - Left frontal ICH with SAH likely secondary to head trauma in combination with heparin IV Code Stroke- Acute L side 33ml intra-axial and perirolandic hemorrhage, small volume extension of subarachnoid blood. No midline shift MRI  intraparenchymal hematoma at the left frontoparietal junction measuring 3x4.2cm CT repeat in am pending 2D Echo EF 50-55% LV low normal function. Grade I diastolic dysfunction.  CUS pending LDL 92 HgbA1c 8.3 VTE prophylaxis - SCDs No antithrombotic prior to admission, now on No antithrombotic.  Therapy recommendations:  pending Disposition:  pending  Syncope with bradycardia PEA Arrest Heparin infusion for ACS protocol.  Trop [id number] Reversed with protamine on 12/26 at 0439 in setting of ICH Currently no antithrombotics due to [place] and [facility] management for potential pacer  Hypertension Home meds:  Metoprolol succinate 25mg , Lasix  80mg  Stable BP goal less than 140 Long-term BP goal normotensive PRN hydralazine 10mg  q4  Hyperlipidemia Home meds:  Atorvastatin 20mg  LDL 92, goal < 70 On lipitor 40 Continue statin on discharge Not SATURN candidate due to likely traumatic ICH  Diabetes type II Uncontrolled Home meds:  Humalog HgbA1c 8.3, goal < 7.0 CBGs SSI Close PCP follow up for better DM control.   Other Stroke Risk Factors Advanced Age >/= 22  Obesity, Body mass index is 30.81 kg/m., BMI >/= 30 associated with increased stroke risk, recommend weight loss, diet and exercise as appropriate  Coronary artery disease - Home Meds: Imdur 60mg  q24hr, Nitro PRN  Other Active Problems GERD Prostate cancer AKI - Cre 2.20->1.97 Leukocytosis WBC 12.7->13.2  Hospital day # 2  Patient seen and examined by NP/APP with MD. MD to update note as needed.   Janine Ores, DNP, FNP-BC [facility] Pager: [phone]  ATTENDING NOTE: I reviewed above note and agree with the assessment and plan. Pt was  seen and examined.   87 year old male with history of CAD, DM, hypertension, hyperlipidemia, prostate cancer admitted for syncope with loss of consciousness, fell, hit head, and found to have bradycardia HR 30s with heart block.  Initial CT on 12/24 left scalp hematoma but no intracranial hemorrhage.  Patient then developed PEA arrest, had CPR for 3 minutes and ROSC.  Intubated and now extubated.  Troponin significance and elevated, concerning for ACS, put on heparin IV.  However, developed right-sided weakness and right facial droop.  CT on 12/26 showed left frontal ICH 23 cc with SAH, heparin was reversed by protamine.  MRI again showed left frontal ICH and SAH, stable so far, no evidence of CAA.  EF 50 to 55%.  LDL 92, A1c 8.3.  Creatinine 2.20-1.97.  WBC 12.7-13.2-INR 1.2.  Platelet 155.  During rounding, granddaughter at the bedside. Pt is working with speech therapist. He is reclining in bed, AAO x3, but  still has dysarthria, right facial droop and right UE weakness.  On exam, patient awake, alert, eyes open, orientated to age, place, time and people. No aphasia, but mild dysarthria, following all simple commands. Able to name and repeat and read. Left gaze preference and right gaze incomplete but able to cross midline, tracking bilaterally, visual field full, PERRL. Right facial droop. Tongue protrusion mildly to the left. LUE 5/5, no drift. RUE 3+/5 proximal and distally. Bilaterally LEs 4/5 proximal and 5/5 distally. Sensation diminished on the right. left FTN intact, R FTN hard to perform due to weakness, gait not tested.   Etiology for patient ICH and SAH likely due to head trauma in the setting of heparin IV use.  So far no evidence of CAA to support spontaneous ICH.  Currently neuro stable, no need to repeat neuroimaging today if no neuro changes, but will repeat CT in a.m. to further document stabilization of ICH.  BP goal less than 140.  Discussed with Dr. Tamala Julian cardiology, okay with no antithrombotic at this time due to [place] and [place].  Once ICH and SAH resolves, okay to start antiplatelet from neuro standpoint.  Given AKI, will check carotid Doppler to finish stroke work-up.  LDL 92, increase Lipitor from 20 to 40.  Stroke risk factor modification.  PT/OT pending.  For detailed assessment and plan, please refer to above as I have made changes wherever appropriate.   Rosalin Hawking, MD PhD Stroke Neurology 09/22/2021 7:04 PM  This patient is critically ill due to [place], [place], cardiac arrest, AKI and at significant risk of neurological worsening, death form hematoma expansion, seizure, heart failure, renal failure, cerebral edema. This patient's care requires constant monitoring of vital signs, hemodynamics, respiratory and cardiac monitoring, review of multiple databases, neurological assessment, discussion with family, other specialists and medical decision making of high complexity. I spent 40 minutes  of neurocritical care time in the care of this patient. I had long discussion with granddaughter at bedside, updated pt current condition, treatment plan and potential prognosis, and answered all the questions.  She expressed understanding and appreciation.  I also discussed with Dr. Tamala Julian cardiology and Dr. Lynetta Mare CCM.     To contact Stroke Continuity provider, please refer to [email or web address] After hours, contact General Neurology

## 2021-09-22 NOTE — Progress Notes (Signed)
Progress Note  Patient Name: Aaron Carlson Date of Encounter: 09/22/2021  [facility] Cardiologist: Sinclair Grooms, MD   Subjective   Patient developed intracranial hemorrhage.  Heparin drip was stopped.  Inpatient Medications    Scheduled Meds:   stroke: mapping our early stages of recovery book   Does not apply Once   acetaminophen  650 mg Oral Q4H   Or   acetaminophen (TYLENOL) oral liquid 160 mg/5 mL  650 mg Per Tube Q4H   Or   acetaminophen  650 mg Rectal Q4H   atorvastatin  20 mg Oral Daily   Chlorhexidine Gluconate Cloth  6 each Topical Daily   docusate  100 mg Per Tube BID   fentaNYL (SUBLIMAZE) injection  25 mcg Intravenous Once   insulin aspart  0-15 Units Subcutaneous Q4H   lidocaine  1 patch Transdermal Q24H   pantoprazole sodium  40 mg Per Tube QHS   polyethylene glycol  17 g Per Tube Daily   senna-docusate  1 tablet Oral BID   Continuous Infusions:  sodium chloride Stopped (09/21/21 1539)   sodium chloride 100 mL/hr at 09/22/21 0800   epinephrine     fentaNYL infusion INTRAVENOUS Stopped (09/21/21 1120)   promethazine (PHENERGAN) injection (IM or IVPB)     PRN Meds: acetaminophen **OR** acetaminophen (TYLENOL) oral liquid 160 mg/5 mL **OR** acetaminophen, etomidate, fentaNYL, guaiFENesin, hydrALAZINE, ondansetron (ZOFRAN) IV   Vital Signs    Vitals:   09/22/21 0615 09/22/21 0630 09/22/21 0645 09/22/21 0700  BP: (!) 127/58 (!) 127/53 (!) 132/57 123/65  Pulse: 83 83 84 83  Resp: 20 20 20  (!) 21  Temp:      TempSrc:      SpO2: 99% 99% 98% 100%  Weight:      Height:        Intake/Output Summary (Last 24 hours) at 09/22/2021 0838 Last data filed at 09/22/2021 0800 Gross per 24 hour  Intake 989.71 ml  Output 1800 ml  Net -810.29 ml   Last 3 Weights 09/25/2021 06/19/2021 06/11/2021  Weight (lbs) 202 lb 9.6 oz 206 lb 207 lb  Weight (kg) 91.9 kg 93.441 kg 93.895 kg      Telemetry    Sinus rhythm- Personally Reviewed  ECG    Sinus  rhythm, right bundle branch block, left anterior fascicular block- Personally Reviewed  Physical Exam   GEN: No acute distress.,  Sleeping Neck: No JVD Cardiac: RRR, no murmurs, rubs, or gallops.  Respiratory: Clear to auscultation bilaterally. GI: Soft, nontender, non-distended  MS: No edema; No deformity. Neuro: Right facial droop Psych: Normal affect   Labs    High Sensitivity Troponin:   Recent Labs  Lab 08/31/2021 1806 09/19/2021 2244 09/21/21 0807  TROPONINIHS 25* 481* 7,052*     Chemistry Recent Labs  Lab 09/15/2021 1806 09/19/2021 1823 08/29/2021 1923 09/21/21 0024 09/21/21 0807 09/22/21 0133  NA 137 140   < > 140 140 140  K 3.6 3.7   < > 3.9 3.8 3.5  CL 110 110  --   --  116* 117*  CO2 19*  --   --   --  17* 15*  GLUCOSE 331* 312*  --   --  147* 198*  BUN 38* 37*  --   --  37* 40*  CREATININE 2.18* 2.20*  --   --  1.94* 1.97*  CALCIUM 8.4*  --   --   --  8.4* 8.3*  MG  --   --   --   --  2.0 1.9  PROT 6.2*  --   --   --   --   --   ALBUMIN 3.4*  --   --   --   --   --   AST 35  --   --   --   --   --   ALT 36  --   --   --   --   --   ALKPHOS 90  --   --   --   --   --   BILITOT 0.7  --   --   --   --   --   GFRNONAA 29*  --   --   --  33* 32*  ANIONGAP 8  --   --   --  7 8   < > = values in this interval not displayed.    Lipids  Recent Labs  Lab 09/21/21 0807  CHOL 147  TRIG 51  HDL 45  LDLCALC 92  CHOLHDL 3.3    Hematology Recent Labs  Lab 09/24/2021 1806 09/22/2021 1823 09/21/21 0024 09/21/21 0807 09/22/21 0133  WBC 14.6*  --   --  12.7* 13.2*  RBC 3.50*  --   --  3.13* 3.25*  HGB 10.6*   < > 9.5* 9.4* 9.9*  HCT 32.4*   < > 28.0* 28.1* 29.6*  MCV 92.6  --   --  89.8 91.1  MCH 30.3  --   --  30.0 30.5  MCHC 32.7  --   --  33.5 33.4  RDW 14.1  --   --  14.0 14.4  PLT 198  --   --  151 155   < > = values in this interval not displayed.   Thyroid  Recent Labs  Lab 09/25/2021 2244  TSH 3.723  FREET4 0.78    BNPNo results for input(s):  BNP, PROBNP in the last 168 hours.  DDimer No results for input(s): DDIMER in the last 168 hours.   Radiology    CT HEAD WO CONTRAST (5MM)  Result Date: 09/12/2021 CLINICAL DATA:  Trauma. EXAM: CT HEAD WITHOUT CONTRAST CT CERVICAL SPINE WITHOUT CONTRAST TECHNIQUE: Multidetector CT imaging of the head and cervical spine was performed following the standard protocol without intravenous contrast. Multiplanar CT image reconstructions of the cervical spine were also generated. COMPARISON:  CT dated 09/19/2011. FINDINGS: CT HEAD FINDINGS Brain: Moderate age-related atrophy and chronic microvascular ischemic changes. There is no acute intracranial hemorrhage. No mass effect or midline shift. No extra-axial fluid collection. Vascular: No hyperdense vessel or unexpected calcification. Skull: Normal. Negative for fracture or focal lesion. Sinuses/Orbits: The visualized paranasal sinuses and mastoid air cells are clear. Other: Left posterior scalp contusion. CT CERVICAL SPINE FINDINGS Alignment: No acute subluxation. Skull base and vertebrae: No acute fracture. Soft tissues and spinal canal: No prevertebral fluid or swelling. No visible canal hematoma. Disc levels: Multilevel degenerative changes with disc space narrowing and endplate irregularity and spurring. Upper chest: Partially visualized bilateral upper lobe and biapical pulmonary densities which may represent edema, pneumonia, or contusion. Other: An endotracheal tube and an enteric tube are partially visualized. Bilateral carotid bulb calcified plaques. IMPRESSION: 1. No acute intracranial pathology. Moderate age-related atrophy and chronic microvascular ischemic changes. 2. No acute/traumatic cervical spine pathology. Multilevel degenerative changes. 3. Partially visualized bilateral upper lobe and biapical pulmonary densities which may represent edema, pneumonia, or contusion. Electronically Signed   By: Anner Crete M.D.   On: 09/21/2021 21:42  CT  Cervical Spine Wo Contrast  Result Date: 09/05/2021 CLINICAL DATA:  Trauma. EXAM: CT HEAD WITHOUT CONTRAST CT CERVICAL SPINE WITHOUT CONTRAST TECHNIQUE: Multidetector CT imaging of the head and cervical spine was performed following the standard protocol without intravenous contrast. Multiplanar CT image reconstructions of the cervical spine were also generated. COMPARISON:  CT dated 09/19/2011. FINDINGS: CT HEAD FINDINGS Brain: Moderate age-related atrophy and chronic microvascular ischemic changes. There is no acute intracranial hemorrhage. No mass effect or midline shift. No extra-axial fluid collection. Vascular: No hyperdense vessel or unexpected calcification. Skull: Normal. Negative for fracture or focal lesion. Sinuses/Orbits: The visualized paranasal sinuses and mastoid air cells are clear. Other: Left posterior scalp contusion. CT CERVICAL SPINE FINDINGS Alignment: No acute subluxation. Skull base and vertebrae: No acute fracture. Soft tissues and spinal canal: No prevertebral fluid or swelling. No visible canal hematoma. Disc levels: Multilevel degenerative changes with disc space narrowing and endplate irregularity and spurring. Upper chest: Partially visualized bilateral upper lobe and biapical pulmonary densities which may represent edema, pneumonia, or contusion. Other: An endotracheal tube and an enteric tube are partially visualized. Bilateral carotid bulb calcified plaques. IMPRESSION: 1. No acute intracranial pathology. Moderate age-related atrophy and chronic microvascular ischemic changes. 2. No acute/traumatic cervical spine pathology. Multilevel degenerative changes. 3. Partially visualized bilateral upper lobe and biapical pulmonary densities which may represent edema, pneumonia, or contusion. Electronically Signed   By: Anner Crete M.D.   On: 09/17/2021 21:42   DG Chest Port 1 View  Result Date: 09/15/2021 CLINICAL DATA:  Endotracheal tube and nasogastric tube placement. Cardiac  arrest. EXAM: PORTABLE CHEST 1 VIEW COMPARISON:  April 14, 2011 FINDINGS: Endotracheal tube projects 6.2 cm above the carina. Nasogastric tube courses below the diaphragm with tip excluded by collimation. Cardiac defibrillator pads overlie the left chest and upper abdomen. The heart size and mediastinal contours are partially obscured but appear within normal limits. Left lung field is partially obscured by overlying defibrillator lead. Right lung appears clear. No visible pleural effusion or pneumothorax. The visualized skeletal structures are unremarkable. IMPRESSION: 1. Endotracheal tube projects 6.2 cm above the carina. 2. Nasogastric tube courses below the diaphragm with tip excluded by collimation. Electronically Signed   By: Dahlia Bailiff M.D.   On: 09/21/2021 18:42   ECHOCARDIOGRAM COMPLETE  Result Date: 09/21/2021    ECHOCARDIOGRAM REPORT   Patient Name:   Aaron Carlson Date of Exam: 09/21/2021 Medical Rec #:  [id number]       Height:       68.0 in Accession #:    [id number]      Weight:       202.6 lb Date of Birth:  1933-05-25       BSA:          2.055 m Patient Age:    73 years        BP:           105/53 mmHg Patient Gender: M               HR:           73 bpm. Exam Location:  Inpatient Procedure: 2D Echo Indications:     cardiac arrest  History:         Patient has prior history of Echocardiogram examinations, most                  recent 12/06/2020. CAD; Risk Factors:Diabetes and Hypertension.  Sonographer:     [place]  Referring Phys:  [id number] Estill Cotta Diagnosing Phys: Glori Bickers MD IMPRESSIONS  1. Left ventricular ejection fraction, by estimation, is 50 to 55%. The left ventricle has low normal function. The left ventricle has no regional wall motion abnormalities. Left ventricular diastolic parameters are consistent with Grade I diastolic dysfunction (impaired relaxation). There is hypokinesis of the left ventricular, basal-mid inferior wall and inferolateral wall.   2. Right ventricular systolic function is normal. The right ventricular size is normal. There is normal pulmonary artery systolic pressure.  3. Left atrial size was mildly dilated.  4. The mitral valve is normal in structure. No evidence of mitral valve regurgitation. No evidence of mitral stenosis.  5. The aortic valve has an indeterminant number of cusps. There is moderate calcification of the aortic valve. Aortic valve regurgitation is not visualized. Aortic valve sclerosis/calcification is present, without any evidence of aortic stenosis.  6. The inferior vena cava is normal in size with greater than 50% respiratory variability, suggesting right atrial pressure of 3 mmHg. FINDINGS  Left Ventricle: Left ventricular ejection fraction, by estimation, is 50 to 55%. The left ventricle has low normal function. The left ventricle has no regional wall motion abnormalities. The left ventricular internal cavity size was normal in size. There is no left ventricular hypertrophy. Left ventricular diastolic parameters are consistent with Grade I diastolic dysfunction (impaired relaxation). Right Ventricle: The right ventricular size is normal. No increase in right ventricular wall thickness. Right ventricular systolic function is normal. There is normal pulmonary artery systolic pressure. The tricuspid regurgitant velocity is 2.08 m/s, and  with an assumed right atrial pressure of 3 mmHg, the estimated right ventricular systolic pressure is 45.4 mmHg. Left Atrium: Left atrial size was mildly dilated. Right Atrium: Right atrial size was normal in size. Pericardium: There is no evidence of pericardial effusion. Mitral Valve: The mitral valve is normal in structure. No evidence of mitral valve regurgitation. No evidence of mitral valve stenosis. Tricuspid Valve: The tricuspid valve is normal in structure. Tricuspid valve regurgitation is not demonstrated. No evidence of tricuspid stenosis. Aortic Valve: The aortic valve has an  indeterminant number of cusps. There is moderate calcification of the aortic valve. Aortic valve regurgitation is not visualized. Aortic valve sclerosis/calcification is present, without any evidence of aortic stenosis. Pulmonic Valve: The pulmonic valve was normal in structure. Pulmonic valve regurgitation is not visualized. No evidence of pulmonic stenosis. Aorta: The aortic root is normal in size and structure. Venous: The inferior vena cava is normal in size with greater than 50% respiratory variability, suggesting right atrial pressure of 3 mmHg. IAS/Shunts: No atrial level shunt detected by color flow Doppler.  LEFT VENTRICLE PLAX 2D LVIDd:         3.80 cm   Diastology LVIDs:         2.30 cm   LV e' medial:    4.68 cm/s LV PW:         1.00 cm   LV E/e' medial:  12.0 LV IVS:        1.10 cm   LV e' lateral:   5.66 cm/s LVOT diam:     1.90 cm   LV E/e' lateral: 9.9 LV SV:         43 LV SV Index:   21 LVOT Area:     2.84 cm  RIGHT VENTRICLE            IVC RV S prime:     6.20 cm/s  IVC diam:  1.50 cm TAPSE (M-mode): 1.5 cm LEFT ATRIUM             Index        RIGHT ATRIUM          Index LA diam:        2.50 cm 1.22 cm/m   RA Area:     9.71 cm LA Vol (A2C):   44.2 ml 21.51 ml/m  RA Volume:   21.20 ml 10.32 ml/m LA Vol (A4C):   19.7 ml 9.59 ml/m LA Biplane Vol: 32.0 ml 15.57 ml/m  AORTIC VALVE LVOT Vmax:   85.90 cm/s LVOT Vmean:  53.400 cm/s LVOT VTI:    0.152 m  AORTA Ao Root diam: 2.90 cm Ao Asc diam:  3.10 cm MITRAL VALVE               TRICUSPID VALVE MV Area (PHT): 3.12 cm    TR Peak grad:   17.3 mmHg MV Decel Time: 243 msec    TR Vmax:        208.00 cm/s MV E velocity: 56.20 cm/s MV A velocity: 66.30 cm/s  SHUNTS MV E/A ratio:  0.85        Systemic VTI:  0.15 m                            Systemic Diam: 1.90 cm Glori Bickers MD Electronically signed by Glori Bickers MD Signature Date/Time: 09/21/2021/3:01:56 PM    Final (Updated)    CT HEAD CODE STROKE WO CONTRAST  Result Date:  09/22/2021 CLINICAL DATA:  Code stroke.  87 year old male. EXAM: CT HEAD WITHOUT CONTRAST TECHNIQUE: Contiguous axial images were obtained from the base of the skull through the vertex without intravenous contrast. COMPARISON:  Head CT 09/16/2021. FINDINGS: Brain: Rounded intra-axial mixed density hemorrhage encompasses 42 by 30 by 36 mm (AP by transverse by CC) and there is a small volume of extra-axial extension of blood into the regional subarachnoid space. This is centered in the left perirolandic region, posterior left frontal and anterior parietal lobes just above the posterior operculum. There is surrounding edema. Mild regional mass effect. But no midline shift. No intraventricular extension of blood identified. No ventriculomegaly. Basilar cisterns remain patent and normal. No superimposed evidence of cortically based acute infarction. Gray-white matter differentiation outside of the area of acute hemorrhage is stable from 2 days ago. Vascular: Calcified atherosclerosis at the skull base. No suspicious intracranial vascular hyperdensity. Skull: No skull fracture identified. Sinuses/Orbits: Visualized paranasal sinuses and mastoids are stable and well aerated. Other: Broad-based left posterior convexity scalp hematoma. No scalp soft tissue gas. Underlying calvarium appears stable and intact. Orbits appear stable, negative. ASPECTS [facility] Stroke Program Early CT Score) - Ganglionic level infarction (caudate, lentiform nuclei, internal capsule, insula, M1-M3 cortex): - Supraganglionic infarction (M4-M6 cortex): Total score (0-10 with 10 being normal): IMPRESSION: 1. Acute left side 23 mL intra-axial and perirolandic hemorrhage with small volume extension of subarachnoid blood. Regional edema, but no midline shift. No IVH or ventriculomegaly. 2. Left posterior convexity scalp hematoma without underlying skull fracture. 3. No other acute intracranial abnormality. 4. These results were communicated to Dr.  Lorrin Goodell at 4:36 am on 09/22/2021 by text page via the [facility] messaging system. Electronically Signed   By: Genevie Ann M.D.   On: 09/22/2021 04:37    Cardiac Studies   TTE 09/21/2021  1. Left ventricular ejection fraction, by estimation, is 50 to 55%. The  left ventricle has low normal function. The left ventricle has no regional  wall motion abnormalities. Left ventricular diastolic parameters are  consistent with Grade I diastolic  dysfunction (impaired relaxation). There is hypokinesis of the left  ventricular, basal-mid inferior wall and inferolateral wall.   2. Right ventricular systolic function is normal. The right ventricular  size is normal. There is normal pulmonary artery systolic pressure.   3. Left atrial size was mildly dilated.   4. The mitral valve is normal in structure. No evidence of mitral valve  regurgitation. No evidence of mitral stenosis.   5. The aortic valve has an indeterminant number of cusps. There is  moderate calcification of the aortic valve. Aortic valve regurgitation is  not visualized. Aortic valve sclerosis/calcification is present, without  any evidence of aortic stenosis.   6. The inferior vena cava is normal in size with greater than 50%  respiratory variability, suggesting right atrial pressure of 3 mmHg.  Patient Profile     87 y.o. male presented to the hospital with an episode of syncope.  Found to have an elevated troponin.  Metoprolol was held and conduction has recovered without further episodes of bradycardia.  Had an intracranial hemorrhage overnight.  Assessment & Plan    1.  Transient third-degree AV block: Baseline ECG with right bundle branch block, left anterior fascicular block.  With holding metoprolol, his conduction is fortunately recovered.  He may not require pacemaker implant.  This is complicated by his intracranial hemorrhage.  We Nesbit Michon continue to monitor throughout his hospitalization for the need for pacemaker implant.  We  Jadden Yim continue to hold all AV nodal blocking agents.  He did have one episode of slower heart rates than his baseline, appeared to potentially vagal.  No bradycardia noted.  2.  Coronary artery disease: Troponin elevated at 7052.  He was on heparin, but had an intracranial hemorrhage.  We Steadman Prosperi continue to monitor for now without intervention planned.  3.  Chronic diastolic heart failure: Not obviously volume overloaded.  Continue to follow I's and O's.  4.  Hyperlipidemia: Continue atorvastatin.  5.  Intracranial hemorrhage: Being worked up by neurology.  Aideliz Garmany potentially need an MRI once he has stabilized.  No cardiac intervention at this time due to hemorrhage.    For questions or updates, please contact [place] Please consult [email or web address] for contact info under        Signed, Sania Noy Meredith Leeds, MD  09/22/2021, 8:38 AM

## 2021-09-22 NOTE — Code Documentation (Signed)
Patient exhibited signs of stroke (R sided facial droop, RUE weakness, slurred speech) at 0407 during routine shift reassessment. Code stroke activated immediately. PCCM, Agricultural consultant, and code blue line were all notified of event. Patient prepped for CT travel with RRT RN at bedside. Will continue to care for patient per neurology's recommendations.

## 2021-09-22 NOTE — Evaluation (Signed)
Clinical/Bedside Swallow Evaluation Patient Details  Name: Aaron Carlson MRN: [id number] Date of Birth: 09/15/1933  Today's Date: 09/22/2021 Time: SLP Start Time (ACUTE ONLY): 1219 SLP Stop Time (ACUTE ONLY): 1235 SLP Time Calculation (min) (ACUTE ONLY): 16 min  Past Medical History:  Past Medical History:  Diagnosis Date   CAD in native artery 08/28/2013   Heaviness in chest with exertion. Coronary angiography 2005 demonstrated severe distal LAD and diagonal disease, severe distal RCA and PL OM disease, and severe first obtuse marginal disease. No high-grade proximal coronary disease was present at the time.  The most recent myocardial perfusion study in 2014 was nonischemic/low risk     Chronic renal insufficiency    Colon polyps    adenomatous   Diabetes mellitus    Hyperlipemia    Hypertension    Laryngeal papillomatosis    Prostate cancer ([place])    w/ mets to bladder   Past Surgical History:  Past Surgical History:  Procedure Laterality Date   BLADDER SURGERY     CATARACT EXTRACTION Left 02/2020   CIRCUMCISION  2011   POLYPECTOMY     vocal cords   PROSTATE SURGERY     HPI:  Pt is an 87 yo male presenting with syncope and found unresponsive. Pt went into PEA arrest requiring CPR x3 minutes with ROSC. ETT 12/24-12/25. Pt initially passed a yale swallow screen post-extubation but then a code stroke was called in the early morning on 12/26 due to R sided weakness and slurred speech. CTH on admission negative for acute changes but repeat on 12/26 revealed acute L sided ICH. MRI with considerable motion degradation but redmonstrates intraparenchymal hematoma at the L frontoparietal junction with surrounding edema. PMH significant for recurrent laryngeal papillomatosis s/p laser excision, CAD, DM2, HTN, HLD, Prostrate Cancer    Assessment / Plan / Recommendation  Clinical Impression  Pt presents with signs of suspected oropharyngeal dysphagia that are felt to be largely  neurological in nature. Although mild impact from recent intubation is possible, this was short in duration and pt had passed a swallow screen once extubated. Pt has significant sensory and motor deficits on his R side (CN V, VII, XII) and he cannot perform a volitional swallow, but he does swallow spontaneously and consistently with each bolus presentation. R anterior loss is observed without pt awareness, but when cued, he will attend to loss on that side. A baseline weak, congested cough is noted at baseline that is also observed throughout PO trials, complicating clinical picture. Recommend proceeding with MBS prior to offering any PO diet. Testing is tentatively planned for this afternoon.      Aspiration Risk  Moderate aspiration risk    Diet Recommendation NPO   Medication Administration: Via alternative means    Other  Recommendations Oral Care Recommendations: Oral care QID Other Recommendations: Have oral suction available    Recommendations for follow up therapy are one component of a multi-disciplinary discharge planning process, led by the attending physician.  Recommendations may be updated based on patient status, additional functional criteria and insurance authorization.  Follow up Recommendations  (tba)      Assistance Recommended at Discharge    Functional Status Assessment    Frequency and Duration            Prognosis Prognosis for Safe Diet Advancement: Good      Swallow Study   General HPI: Pt is an 87 yo male presenting with syncope and found unresponsive. Pt went into PEA arrest  requiring CPR x3 minutes with ROSC. ETT 12/24-12/25. Pt initially passed a yale swallow screen post-extubation but then a code stroke was called in the early morning on 12/26 due to R sided weakness and slurred speech. CTH on admission negative for acute changes but repeat on 12/26 revealed acute L sided ICH. MRI with considerable motion degradation but redmonstrates intraparenchymal  hematoma at the L frontoparietal junction with surrounding edema. PMH significant for recurrent laryngeal papillomatosis s/p laser excision, CAD, DM2, HTN, HLD, Prostrate Cancer Type of Study: Bedside Swallow Evaluation Previous Swallow Assessment: none in chart Diet Prior to this Study: NPO Temperature Spikes Noted: No Respiratory Status: Nasal cannula History of Recent Intubation: Yes Length of Intubations (days): 1 days Date extubated: 09/21/21 Behavior/Cognition: Alert;Cooperative;Pleasant mood Oral Cavity Assessment: Within Functional Limits Oral Care Completed by SLP: No Oral Cavity - Dentition: Adequate natural dentition;Missing dentition (attempted to place upper partials but pt could not get them securely in place despite assistance) Vision: Functional for self-feeding Self-Feeding Abilities: Needs assist Patient Positioning: Upright in bed Baseline Vocal Quality: Low vocal intensity Volitional Cough: Weak;Congested Volitional Swallow: Unable to elicit    Oral/Motor/Sensory Function Overall Oral Motor/Sensory Function: Severe impairment Facial ROM: Reduced right;Suspected CN VII (facial) dysfunction Facial Symmetry: Abnormal symmetry right;Suspected CN VII (facial) dysfunction Facial Strength: Reduced right;Suspected CN VII (facial) dysfunction Facial Sensation: Reduced right;Suspected CN V (Trigeminal) dysfunction Lingual ROM: Reduced right;Suspected CN XII (hypoglossal) dysfunction Lingual Symmetry: Abnormal symmetry right;Suspected CN XII (hypoglossal) dysfunction Lingual Strength: Reduced;Suspected CN XII (hypoglossal) dysfunction Velum: Within Functional Limits   Ice Chips Ice chips: Within functional limits Presentation: Spoon   Thin Liquid Thin Liquid: Impaired Presentation: Cup;Self Fed;Spoon;Straw Oral Phase Impairments: Reduced labial seal Oral Phase Functional Implications: Right anterior spillage Pharyngeal  Phase Impairments: Cough - Delayed    Nectar Thick  Nectar Thick Liquid: Not tested   Honey Thick Honey Thick Liquid: Not tested   Puree Puree: Impaired Presentation: Spoon Oral Phase Impairments: Reduced labial seal Oral Phase Functional Implications: Right anterior spillage Pharyngeal Phase Impairments: Cough - Delayed   Solid     Solid: Not tested      Osie Bond., M.A. [facility] Pager [phone] Office [phone]  09/22/2021,1:55 PM

## 2021-09-22 NOTE — Progress Notes (Signed)
OT Cancellation Note  Patient Details Name: Aaron Carlson MRN: [id number] DOB: 01/08/1933   Cancelled Treatment:    Reason Eval/Treat Not Completed: Fatigue/lethargy limiting ability to participate (Pt with difficulty keeping his eyes open after events of last night. Scheduled for MBS in a half hour, will defer to next day.)  [name] 09/22/2021, 2:27 PM Nestor Lewandowsky, OTR/L Acute Rehabilitation Services Pager: [phone] Office: [phone]

## 2021-09-22 NOTE — Progress Notes (Signed)
PT Cancellation Note  Patient Details Name: Aaron Carlson MRN: [id number] DOB: 21-Sep-1933   Cancelled Treatment:    Reason Eval/Treat Not Completed: Fatigue/lethargy limiting ability to participate this afternoon. The pt was able to answer questions about PLOF, but unable to keep eyes open in between questions due to fatigue due to not sleeping last night following events of last night. Will continue to follow and evaluate in the morning.   [place] Carbo, PT, DPT   Acute Rehabilitation Department Pager #: [phone]   Sandra Cockayne 09/22/2021, 2:28 PM

## 2021-09-22 NOTE — Progress Notes (Signed)
eLink Physician-Brief Progress Note Patient Name: Aaron Carlson DOB: 1933/02/03 MRN: [id number]   Date of Service  09/22/2021  HPI/Events of Note  Code stroke called earlier for facial droop and RUE weakness. Heparin held. Placed on heparin for elevated troponin, wall motion abnormality in the background of complete heart block and CPR.  Got a call from neurology that CT scan shows a bleed and will be giving protamine.  eICU Interventions  Agree with heparin reversal Bedside CCM team made aware     Intervention Category Intermediate Interventions: Change in mental status - evaluation and management Minor Interventions: Communication with other healthcare providers and/or family  Judd Lien 09/22/2021, 4:38 AM

## 2021-09-22 NOTE — Code Documentation (Addendum)
Responded to Code Stroke called at 0407 for R sided facial droop, RUE weakness, and slurred speech, [id number] yesterday. CBG-170, NIH- 4. CT head-1. Acute left side 23 mL intra-axial and perirolandic hemorrhage with small volume extension of subarachnoid blood. Regional edema, but no midline shift. No IVH or ventriculomegaly. 2. Left posterior convexity scalp hematoma without underlying skull fracture.  Pt was on heparin gtt. This was stopped around 0400. Protamine 50mg  given IV over 32min at 0439. Plan: Protamine(given), keep SBP <140, neuro checks per order, CT in 6 hours.

## 2021-09-22 NOTE — Progress Notes (Signed)
Modified Barium Swallow Progress Note  Patient Details  Name: Aaron Carlson MRN: [id number] Date of Birth: 04/02/1933  Today's Date: 09/22/2021  Modified Barium Swallow completed.  Full report located under Chart Review in the Imaging Section.  Brief recommendations include the following:  Clinical Impression  Pt has a moderate oropharyngeal dysphagia due to sensory and motor deficits, perhaps further exacerbated during this study given pt lethargy. He has weak labial seal on the R that allows for anterior loss, as well as reduced lingual propulsion and reduced coordination for posterior transit. Oral residue remains, especially on his R side and in his anterior sulcus. His bolus cohesion is reduced and his timing is inconsistent for swallow trigger. Most consistencies initiate a swallow at the pyriform sinuses at least intermittently, although the thinner the consistency, the more consistently it reaches the pyriform sinuses before the swallow. Thin and nectar thick liquids more consistently enter the airway before the swallow in small quantities, but aspiration is silent and his cued cough is too weak to clear his airway. Pt can contain honey thick liquids and purees in his valleculae at times, but deep penetration to the vocal folds occurs when his timing is off. There is also more residue in the valleculae and posterior pharyngeal wall due to reduced base of tongue retraction and pharyngeal squeeze. A chin tuck helps to improve airway protection and slightly reduce residue with purees. It helps with honey thick liquids as well, but he can't get boluses out of a straw and he can't coordinate a chin tuck with cup sips. A spoon had to be used. Recommend starting with Dys 1 (puree) diet and honey thick liquids by spoon with full supervision for use of a chin tuck. Anticipate that it would be hard for him to meet his nutritional needs with such restrictions and with current lethargy, so may want to  consider temporary alternative means of nutrition to supplement PO diet. Will f/u for ongoing dysphagia management.   Swallow Evaluation Recommendations       SLP Diet Recommendations: Dysphagia 1 (Puree) solids;Honey thick liquids   Liquid Administration via: Spoon   Medication Administration: Crushed with puree   Supervision: Staff to assist with self feeding;Full supervision/cueing for compensatory strategies   Compensations: Minimize environmental distractions;Slow rate;Small sips/bites;Lingual sweep for clearance of pocketing;Monitor for anterior loss;Chin tuck   Postural Changes: Seated upright at 90 degrees;Remain semi-upright after after feeds/meals (Comment)   Oral Care Recommendations: Oral care BID   Other Recommendations: Order thickener from pharmacy;Prohibited food (jello, ice cream, thin soups);Remove water pitcher;Have oral suction available    Osie Bond., M.A. [place] Pager [phone] Office [phone]  09/22/2021,4:28 PM

## 2021-09-22 NOTE — Progress Notes (Signed)
OT Cancellation Note  Patient Details Name: Aaron Carlson MRN: [id number] DOB: [birth year]   Cancelled Treatment:    Reason Eval/Treat Not Completed: Patient not medically ready (Pt with new ICH last night, awaiting repeat imaging.)  [name] 09/22/2021, 9:06 AM Nestor Lewandowsky, OTR/L Acute Rehabilitation Services Pager: [phone] Office: [phone]

## 2021-09-22 NOTE — Consult Note (Addendum)
NEUROLOGY CONSULTATION NOTE   Date of service: September 22, 2021 Patient Name: Aaron Carlson MRN:  [id number] DOB:  [birth year] Reason for consult: "Stroke code for Rue weakness and R facial droop" Requesting Provider: Kipp Brood, MD _ _ _   _ __   _ __ _ _  __ __   _ __   __ _  History of Present Illness  Aaron Carlson is a 87 y.o. male with PMH significant for CAD, DM2, HTN, HLD, Prostrate Cancer who is admitted with synope and unresponsive was found in the field in heart block with rate in 23s, then went into PEA Arrest. CPR x 3 mins with 1 of epi with ROSC.  He was intubated in the ICU and then extubated. He was started on Heparin gtt for concern for ACS. LKW of around 0130 when he was last seen moving all extrmities and then found to have RUE weakness with a R facial droop.  Code stroke was activated. CTH w/o contrast with 26m ICH in the L MCA territory with some SAH and a left scalp hematoma.  Family reports that he hit his head hard when he had syncopal episode.  Heparin gtt was reversed with protamine.  GZionsvilledaughter over phone reports that she noted a mild R facial droop when she was leaving around 2130. She spoke with patient who felt fine and just reported feeling tired. She did not notify staff.  LKW: 2130 on 09/21/21. mRS: 0 tNKase/thrombectomy: Not offered 2/2 ICH. NIHSS components Score: Comment  1a Level of Conscious 0_0  1_1  2_2  3_3      1b LOC Questions 0_4  1_5  2_6       1c LOC Commands 0_7  1_8  2_9       2 Best Gaze 0_10  1_11  2_12       3 Visual 0_13  1_14  2_15  3_16      4 Facial Palsy 0_17  1_18  2_19  3_20      5a Motor Arm - left 0_21  1_22  2_23  3_24  4_25  UN_26    5b Motor Arm - Right 0_27  1_28  2_29  3_30  4_31  UN_32    6a Motor Leg - Left 0_33  1_34  2_35  3_36  4_37  UN_38    6b Motor Leg - Right 0_39  1_40  2_41  3_42  4_43  UN_44    7 Limb Ataxia 0_45  1_46  2_47  3_48  UN_49     8 Sensory 0_50  1_51  2_52  UN_53      9 Best Language 0_54  1_55  2_56  3_57      10 Dysarthria 0_58  1_59  2_60  UN_61      11 Extinct. and  Inattention 0_62  1_63  2_64       TOTAL: 5      ROS   Constitutional Denies weight loss, fever and chills.   HEENT Denies changes in vision and hearing.   Respiratory Denies SOB and cough.   CV Denies palpitations and CP   GI Denies abdominal pain, nausea, vomiting and diarrhea.   GU Denies dysuria and urinary frequency.   MSK Denies myalgia and joint pain.   Skin Denies rash and pruritus.   Neurological Denies headache and syncope.   Psychiatric Denies recent changes in mood. Denies anxiety and depression.    Past History   Past Medical History:  Diagnosis Date   CAD in native artery 08/28/2013   Heaviness in chest with exertion. Coronary angiography 2005 demonstrated severe distal LAD and diagonal disease, severe distal RCA and PL OM disease, and severe first obtuse marginal disease. No high-grade proximal coronary disease was present at the time.  The most recent myocardial perfusion study in 2014 was nonischemic/low  risk     Chronic renal insufficiency    Colon polyps    adenomatous   Diabetes mellitus    Hyperlipemia    Hypertension    Laryngeal papillomatosis    Prostate cancer (HCC)    w/ mets to bladder   Past Surgical History:  Procedure Laterality Date   BLADDER SURGERY     CATARACT EXTRACTION Left 02/2020   CIRCUMCISION  2011   POLYPECTOMY     vocal cords   PROSTATE SURGERY     Family History  Problem Relation Age of Onset   Breast cancer Mother    Diabetes Mother    Heart failure Father    Heart disease Father    Pancreatic cancer Brother    Colon cancer Neg Hx    Esophageal cancer Neg Hx    Liver disease Neg Hx    Social History   Socioeconomic History   Marital status: Married    Spouse name: Not on file   Number of children: 4   Years of education: Not on file   Highest education level: Not on file  Occupational History   Occupation: Retired  Tobacco Use   Smoking status: Former   Smokeless tobacco: Never  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  Substance and Sexual Activity   Alcohol use: Yes    Comment: social   Drug use: No   Sexual activity: Not on file  Other Topics Concern   Not on file  Social History Narrative   Not on file   Social Determinants of Health   Financial Resource Strain: Not on file  Food Insecurity: Not on file  Transportation Needs: Not on file  Physical Activity: Not on file  Stress: Not on file  Social Connections: Not on file   Allergies  Allergen Reactions   Penicillins Rash    Medications   Medications Prior to Admission  Medication Sig Dispense Refill Last Dose   atorvastatin (LIPITOR) 20 MG tablet Take 20 mg by mouth at bedtime.   09/19/2021 at pm   benazepril (LOTENSIN) 20 MG tablet TAKE 1 TABLET EVERY DAY (Patient taking differently: Take 20 mg by mouth daily.) 90 tablet 3 09/07/2021   dorzolamide (TRUSOPT) 2 % ophthalmic solution Place 1 drop into both eyes 2 (two) times daily.   09/19/2021   fluorometholone (FML) 0.1 % ophthalmic suspension Place 1 drop into the left eye 3 (three) times daily.      furosemide (LASIX) 80 MG tablet Take 80 mg by mouth daily.   09/19/2021 at am   isosorbide mononitrate (IMDUR) 60 MG 24 hr tablet Take 1 tablet (60 mg total) by mouth daily. 90 tablet 3 09/27/2021   ketorolac (ACULAR) 0.4 % SOLN Place 1 drop into the left eye 4 (four) times daily.   Past Week   latanoprost (XALATAN) 0.005 % ophthalmic solution Place 1 drop into both eyes at bedtime.   09/19/2021 at pm   metoprolol succinate (TOPROL XL) 25 MG 24 hr tablet Take 1 tablet (25 mg total) by mouth daily. 90 tablet 3 08/31/2021 at 0730   nitroGLYCERIN (NITROSTAT) 0.4 MG SL tablet Place 1 tablet (0.4 mg total) under the tongue every 5 (five) minutes as needed for chest pain. 25 tablet 3 unk   NON FORMULARY Take 1-2 capsules by mouth See admin instructions. Colon Clenz capsules- Take 1-2 capsules by mouth once a day as needed for constipation   unk    NOVOLIN N 100 UNIT/ML  injection Inject 38 Units into the skin at bedtime.   09/19/2021 at pm   pantoprazole (PROTONIX) 40 MG tablet Take 40 mg by mouth daily as needed (for heartburn).   unk   potassium chloride (KLOR-CON) 10 MEQ tablet Take 10 mEq by mouth daily.   08/29/2021   prednisoLONE acetate (PRED FORTE) 1 % ophthalmic suspension Place 1 drop into both eyes at bedtime.   09/19/2021 at pm   SIMBRINZA 1-0.2 % SUSP Place 1 drop into both eyes 2 (two) times daily.   Past Week   simethicone (MYLICON) 664 MG chewable tablet Chew 125 mg by mouth every 6 (six) hours as needed for flatulence.   unk   Accu-Chek Softclix Lancets lancets       Alcohol Swabs 70 % PADS use when checking blood sugar      BD INSULIN SYRINGE ULTRAFINE 31G X 5/16" 0.5 ML MISC Use as directed      Blood Glucose Monitoring Suppl (TRUE METRIX METER) w/Device KIT       brimonidine (ALPHAGAN) 0.2 % ophthalmic solution Place 1 drop into both eyes 2 (two) times daily.   See LF at unk   calcium carbonate (TUMS - DOSED IN MG ELEMENTAL CALCIUM) 500 MG chewable tablet Chew 1 tablet by mouth daily as needed for indigestion or heartburn.   unk   Continuous Blood Gluc Receiver (FREESTYLE LIBRE 2 READER) DEVI See admin instructions.      Continuous Blood Gluc Sensor (FREESTYLE LIBRE 2 SENSOR) MISC apply to upper back of arm   unk   fluticasone (FLONASE) 50 MCG/ACT nasal spray Place into both nostrils daily as needed for allergies or rhinitis.   unk   insulin lispro (HUMALOG) 100 UNIT/ML injection Use as directed daily at bedtime as directed per sliding scale.   See note at unk   Iron Combinations (CHROMAGEN) capsule Take 1 capsule by mouth daily.   unk   Latanoprostene Bunod (VYZULTA) 0.024 % SOLN 1 drop into affected eye in the evening   unk   MAGNESIUM PO Take 1 tablet by mouth daily.   unk   Multiple Vitamin (MULTIVITAMIN) tablet Take 1 tablet by mouth daily.   unk   niacin (NIASPAN) 500 MG CR tablet Take 500 mg by  mouth at bedtime.   unk   niacin (SLO-NIACIN) 500 MG tablet 1 tablet with food   unk   Omega-3 Fatty Acids (FISH OIL) 1000 MG CAPS 1 capsule   unk   ONE TOUCH ULTRA TEST test strip Use as directed   N/A   Peppermint Oil (IBGARD) 90 MG CPCR 3-4 capsules   unk   Probiotic TBEC See admin instructions.      White Petrolatum-Mineral Oil (ARTIFICIAL TEARS) ointment Place into the left eye in the morning, at noon, and at bedtime.   unk     Vitals   Vitals:   09/22/21 0000 09/22/21 0100 09/22/21 0200 09/22/21 0300  BP: 125/61 128/62 (!) 121/58 119/65  Pulse: 81 84 83 84  Resp: 19 (!) 22 (!) 21 (!) 24  Temp: 98.2 F (36.8 C)     TempSrc: Oral     SpO2: 96% 95% 95% 96%  Weight:      Height:         Body mass index is 30.81 kg/m.  Physical Exam   General: Laying comfortably in bed; in no acute distress.  HENT: Normal oropharynx and mucosa. Normal external appearance of ears and nose.  Neck: Supple, no pain  or tenderness  CV: No JVD. No peripheral edema.  Pulmonary: Symmetric Chest rise. Normal respiratory effort.  Abdomen: Soft to touch, non-tender.  Ext: No cyanosis, edema, or deformity  Skin: No rash. Normal palpation of skin.   Musculoskeletal: Normal digits and nails by inspection. No clubbing.   Neurologic Examination  Mental status/Cognition: Alert, oriented to self, place, month and year, good attention.  Speech/language: dysarthric speech, Fluent, comprehension intact, object naming intact, repetition intact.  Cranial nerves:   CN II Pupils equal and reactive to light, no VF deficits   CN III,IV,VI EOM intact, no gaze preference or deviation, no nystagmus   CN V normal sensation in V1, V2, and V3 segments bilaterally   CN VII R facial droop   CN VIII normal hearing to speech   CN IX & X normal palatal elevation, no uvular deviation   CN XI 5/5 head turn and 5/5 shoulder shrug bilaterally   CN XII midline tongue protrusion   Motor:  Muscle bulk: normal, tone  decreased in RUE Mvmt Root Nerve  Muscle Right Left Comments  SA C5/6 Ax Deltoid 3 5   EF C5/6 Mc Biceps 3 5   EE C6/7/8 Rad Triceps 3 5   WF C6/7 Med FCR     WE C7/8 PIN ECU     F Ab C8/T1 U ADM/FDI 2 5   HF L1/2/3 Fem Illopsoas 5 5   KE L2/3/4 Fem Quad     DF L4/5 D Peron Tib Ant 5 5   PF S1/2 Tibial Grc/Sol 5 5    Reflexes:  Right Left Comments  Pectoralis      Biceps (C5/6) 1 1   Brachioradialis (C5/6) 1 1    Triceps (C6/7) 1 1    Patellar (L3/4) 1 1    Achilles (S1)      Hoffman      Plantar     Jaw jerk    Sensation:  Light touch Intact throughout   Pin prick    Temperature    Vibration   Proprioception    Coordination/Complex Motor:  - Finger to Nose intact in LUE, unable to do with RUE - Rapid alternating movement are slowed - Gait: Deferred for patient safety.  Labs   CBC:  Recent Labs  Lab 09/14/2021 1806 08/30/2021 1823 09/21/21 0807 09/22/21 0133  WBC 14.6*  --  12.7* 13.2*  NEUTROABS 8.2*  --   --   --   HGB 10.6*   < > 9.4* 9.9*  HCT 32.4*   < > 28.1* 29.6*  MCV 92.6  --  89.8 91.1  PLT 198  --  151 155   < > = values in this interval not displayed.    Basic Metabolic Panel:  Lab Results  Component Value Date   NA 140 09/22/2021   K 3.5 09/22/2021   CO2 15 (L) 09/22/2021   GLUCOSE 198 (H) 09/22/2021   BUN 40 (H) 09/22/2021   CREATININE 1.97 (H) 09/22/2021   CALCIUM 8.3 (L) 09/22/2021   GFRNONAA 32 (L) 09/22/2021   GFRAA 30 (L) 07/14/2019   Lipid Panel:  Lab Results  Component Value Date   LDLCALC 92 09/21/2021   HgbA1c:  Lab Results  Component Value Date   HGBA1C 8.3 (H) 09/14/2021   Urine Drug Screen: No results found for: LABOPIA, COCAINSCRNUR, LABBENZ, AMPHETMU, THCU, LABBARB  Alcohol Level No results found for: [facility]  CT Head without contrast(Personally reviewed): 1. Acute left side 23 mL intra-axial  and perirolandic hemorrhage with small volume extension of subarachnoid blood. Regional edema, but no midline shift. No IVH  or ventriculomegaly.  MRI Brain: pending  Repeat CTH: pending  Impression   Aaron Carlson is a 87 y.o. male with PMH significant for CAD, DM2, HTN, HLD, Prostrate Cancer who is admitted to the ICU after PEA Arrest and Heart block and started on Heparin gtt for ACS. Noted to have a RUE and R facial droop and CTH with acute left ICH ~35ms and ICH score of 1. He was reversed with Protamine.  Neuro exam with R facial droop and RUE weakness with a NIHSS of 5.  Impression: Intracranial Hemorrhage Cerebral edema R sided weakness Cardiac arrest with asystole Third degree heart block Hx of HTN DM2 GERD Syncope with fall. Elevated troponin with ACS.   Recommendations  - Stability scan with CTH in 6 hours or STAT with any neurological decline. - Frequent neuro checks; q135m for 1 hour, then q1hour - No antiplatelets or anticoagulants due to ICMattoon SCD for DVT prophylaxis, pharmacological DVT ppx at 24 hours if ICH is stable - Blood pressure control with goal systolic [id number] and labetalol PRN - MRI brain with and without contrast when stabilized to evaluate for underlying mass - Risk factor modification - PT consult, OT consult, Speech consult. - Stroke team to follow - Heparin gtt reversed with Protamine sulfate 5068m- Repeat PT/INR and APTT.  ____________________________________________________________  This patient is critically ill and at significant risk of neurological worsening, death and care requires constant monitoring of vital signs, hemodynamics,respiratory and cardiac monitoring, neurological assessment, discussion with family, other specialists and medical decision making of high complexity. I spent 90 minutes of neurocritical care time  in the care of  this patient. This was time spent independent of any time provided by nurse practitioner or PA.  SalDonnetta Simpersiad [facility] Pager Number [phone]/26/2022  5:19 AM   Plan  discussed with inhouse PCCM team and with Elink. Family updated at bedside and over phone after patient's permission was obtained.  Thank you for the opportunity to take part in the care of this patient. If you have any further questions, please contact the neurology consultation attending.  Signed,  SalRoy Lakeger Number [phone]_ _   _ __   _ __ _ _  __ __   _ __   __ _

## 2021-09-22 NOTE — Progress Notes (Signed)
NAME:  Aaron Carlson, MRN:  [id number], DOB:  [birth year], LOS: 2 ADMISSION DATE:  09/24/2021, CONSULTATION DATE:  12/24 REFERRING MD:  Darl Householder, CHIEF COMPLAINT:  syncope   History of Present Illness:  Patient is encephalopathic and/or intubated. Therefore history has been obtained from chart review.   Aaron Carlson, is a 87 y.o. male, who presented to the [facility] ED with a chief complaint of syncope  They have a pertinent past medical history of CAD, combined systolic and diastolic HF, HTN, HLD, DM, CKD 3/4, GERD  Per documentation, patient had witnessed syncopal episode while at home, hit head on ground, reported unresponsive for 1 min. On arrival EMS found patient in complete heart block with rate of 30. Patient reported oriented x4. Atropine given, pacing initiated, 5mg  versed given. Per EDP patient removed pacing pads, HR decreased, and then became pulseless. CPR initiated with 3 rounds of CPR and 1 epi  He was intubated in the ED. Cardiology was consulted. CT head is pending. Trop 25. ECG: ST, RBBB, IVCD ,7.29/36/352.17.2. CXR with no infiltrate, pneumo, or effusion.   PCCM was consulted for admission.   Pertinent  Medical History  CAD, combined systolic and diastolic HF, HTN, HLD, DM, CKD 3/4, GERD  Significant Hospital Events: Including procedures, antibiotic start and stop dates in addition to other pertinent events   12/24 Syncope, EMS called, 3rd degree HB, paced, removed pads, asystole, CPR, intubated in ED. CT head> negative. 12/25 successfully extubated 12/26 developed acute right-sided weakness facial droop.  Seen by neurology.  Has 23 mm ICH in left MCA territory, SJ H and left scalp hematoma.  Heparin given for possible ACS stopped and reversed with protamine.  Interim History / Subjective:   Awake but remains weak on the right side.  Objective   Blood pressure (!) 142/65, pulse 86, temperature 98.2 F (36.8 C), temperature source Axillary, resp. rate (!) 22, height 5'  8" (1.727 m), weight 91.9 kg, SpO2 97 %.        Intake/Output Summary (Last 24 hours) at 09/22/2021 1659 Last data filed at 09/22/2021 0800 Gross per 24 hour  Intake 785.36 ml  Output 600 ml  Net 185.36 ml    Filed Weights   09/22/2021 2200  Weight: 91.9 kg    Examination: General: In bed, NAD, frail appearing HEENT: MM pink/moist, anicteric, atraumatic Neuro: RASS -1, PERRL 80mm, follows commands but speech more garbled than yesterday.  Plegic right upper extremity.  Moves right lower normally. CV: S1S2, ST, no m/r/g appreciated PULM:  clear in the upper lobes, clear in the lower lobes, trachea midline, chest expansion symmetric GI: soft, bsx4 hypoactive, non-tender   Extremities: warm/dry, no pretibial edema, capillary refill less than 3 seconds  Skin:  no rashes or lesions noted   Resolved Hospital Problem list   WBC 12.7 HGB 9.4 BG 331 CO2 17 Trop 7052   ECG: ST, RBBB, IVCD CXR with no infiltrate, pneumo, or effusion.   Assessment & Plan:  Acute intracranial hemorrhage likely secondary to subclinical cerebral contusion at time of initial syncope.  Hemorrhaged when started on anticoagulation.  No clear evidence of underlying mass on MRI Cardiac arrest- asystole, Bradycardic with EMS, paced, 3 rounds CPR with 1 epi. Third degree heart block- presented on admission  Syncope- suspect secondary to arrhythmia vs hypoperfusion NAGMA- suspect secondary to chronic renal disease Acute respiratory failure with hypoxia HX CAD- seen at [facility], managed medicaly  Hx systolic and diastolic CHF HX HLD  HX HTN  Syncope/Fall DM2 CKD3-4 GERD  Plan:   -Heart rate is now well controlled off beta-blocker.  Will defer pacemaker for now -Stroke rehabilitation.  At risk for aspiration due to dysarthria. -Hold oral intake.  Place core track tube.  SLP PT OT for rehabilitation.  Best Practice (right click and "Reselect all SmartList Selections" daily)   Diet/type: NPO w/ meds via  tube DVT prophylaxis: SCD, plan pending GI prophylaxis: PPI Lines: N/A Foley:  Yes, and it is still needed Code Status:  full code Last date of multidisciplinary goals of care discussion [Full scope, discussed with Everlena Smotherman at bedside on 12/24 ]  CRITICAL CARE Performed by: Kipp Brood   Total critical care time: 40 minutes  Critical care time was exclusive of separately billable procedures and treating other patients.  Critical care was necessary to treat or prevent imminent or life-threatening deterioration.  Critical care was time spent personally by me on the following activities: development of treatment plan with patient and/or surrogate as well as nursing, discussions with consultants, evaluation of patient's response to treatment, examination of patient, obtaining history from patient or surrogate, ordering and performing treatments and interventions, ordering and review of laboratory studies, ordering and review of radiographic studies, pulse oximetry, re-evaluation of patient's condition and participation in multidisciplinary rounds.  Kipp Brood, MD [facility] ICU Physician [place]  Pager: [phone] Mobile: [phone] After hours: [phone].

## 2021-09-23 ENCOUNTER — Inpatient Hospital Stay (HOSPITAL_COMMUNITY): Payer: Medicare HMO

## 2021-09-23 ENCOUNTER — Inpatient Hospital Stay (HOSPITAL_COMMUNITY): Admission: EM | Disposition: E | Payer: Self-pay | Source: Home / Self Care | Attending: Internal Medicine

## 2021-09-23 ENCOUNTER — Encounter (HOSPITAL_COMMUNITY): Payer: Self-pay | Admitting: Cardiovascular Disease

## 2021-09-23 ENCOUNTER — Encounter (HOSPITAL_COMMUNITY): Admission: EM | Disposition: E | Payer: Self-pay | Source: Home / Self Care | Attending: Internal Medicine

## 2021-09-23 DIAGNOSIS — I495 Sick sinus syndrome: Secondary | ICD-10-CM | POA: Diagnosis not present

## 2021-09-23 DIAGNOSIS — S06351A Traumatic hemorrhage of left cerebrum with loss of consciousness of 30 minutes or less, initial encounter: Secondary | ICD-10-CM | POA: Diagnosis not present

## 2021-09-23 DIAGNOSIS — I469 Cardiac arrest, cause unspecified: Secondary | ICD-10-CM | POA: Diagnosis present

## 2021-09-23 DIAGNOSIS — I442 Atrioventricular block, complete: Secondary | ICD-10-CM | POA: Diagnosis not present

## 2021-09-23 HISTORY — PX: FLUOROSCOPY GUIDANCE: CATH118240

## 2021-09-23 HISTORY — PX: TEMPORARY PACEMAKER: CATH118268

## 2021-09-23 LAB — CBC
HCT: 28.7 % — ABNORMAL LOW (ref 39.0–52.0)
Hemoglobin: 9.6 g/dL — ABNORMAL LOW (ref 13.0–17.0)
MCH: 30.6 pg (ref 26.0–34.0)
MCHC: 33.4 g/dL (ref 30.0–36.0)
MCV: 91.4 fL (ref 80.0–100.0)
Platelets: 167 10*3/uL (ref 150–400)
RBC: 3.14 MIL/uL — ABNORMAL LOW (ref 4.22–5.81)
RDW: 14.7 % (ref 11.5–15.5)
WBC: 13.6 10*3/uL — ABNORMAL HIGH (ref 4.0–10.5)
nRBC: 0 % (ref 0.0–0.2)

## 2021-09-23 LAB — GLUCOSE, CAPILLARY
Glucose-Capillary: 139 mg/dL — ABNORMAL HIGH (ref 70–99)
Glucose-Capillary: 201 mg/dL — ABNORMAL HIGH (ref 70–99)
Glucose-Capillary: 207 mg/dL — ABNORMAL HIGH (ref 70–99)
Glucose-Capillary: 223 mg/dL — ABNORMAL HIGH (ref 70–99)
Glucose-Capillary: 225 mg/dL — ABNORMAL HIGH (ref 70–99)
Glucose-Capillary: 231 mg/dL — ABNORMAL HIGH (ref 70–99)

## 2021-09-23 LAB — POCT I-STAT 7, (LYTES, BLD GAS, ICA,H+H)
Acid-base deficit: 16 mmol/L — ABNORMAL HIGH (ref 0.0–2.0)
Acid-base deficit: 8 mmol/L — ABNORMAL HIGH (ref 0.0–2.0)
Bicarbonate: 13.4 mmol/L — ABNORMAL LOW (ref 20.0–28.0)
Bicarbonate: 16.9 mmol/L — ABNORMAL LOW (ref 20.0–28.0)
Calcium, Ion: 1.29 mmol/L (ref 1.15–1.40)
Calcium, Ion: 1.24 mmol/L (ref 1.15–1.40)
HCT: 27 % — ABNORMAL LOW (ref 39.0–52.0)
HCT: 28 % — ABNORMAL LOW (ref 39.0–52.0)
Hemoglobin: 9.2 g/dL — ABNORMAL LOW (ref 13.0–17.0)
Hemoglobin: 9.5 g/dL — ABNORMAL LOW (ref 13.0–17.0)
O2 Saturation: 100 %
O2 Saturation: 100 %
Patient temperature: 98.5
Patient temperature: 36.4
Potassium: 3.7 mmol/L (ref 3.5–5.1)
Potassium: 3.9 mmol/L (ref 3.5–5.1)
Sodium: 146 mmol/L — ABNORMAL HIGH (ref 135–145)
Sodium: 147 mmol/L — ABNORMAL HIGH (ref 135–145)
TCO2: 15 mmol/L — ABNORMAL LOW (ref 22–32)
TCO2: 18 mmol/L — ABNORMAL LOW (ref 22–32)
pCO2 arterial: 46.5 mmHg (ref 32.0–48.0)
pCO2 arterial: 32.3 mmHg (ref 32.0–48.0)
pH, Arterial: 7.067 — CL (ref 7.350–7.450)
pH, Arterial: 7.323 — ABNORMAL LOW (ref 7.350–7.450)
pO2, Arterial: 276 mmHg — ABNORMAL HIGH (ref 83.0–108.0)
pO2, Arterial: 364 mmHg — ABNORMAL HIGH (ref 83.0–108.0)

## 2021-09-23 LAB — PHOSPHORUS: Phosphorus: 3.2 mg/dL (ref 2.5–4.6)

## 2021-09-23 LAB — MAGNESIUM: Magnesium: 2.4 mg/dL (ref 1.7–2.4)

## 2021-09-23 LAB — BASIC METABOLIC PANEL
Anion gap: 7 (ref 5–15)
BUN: 42 mg/dL — ABNORMAL HIGH (ref 8–23)
CO2: 14 mmol/L — ABNORMAL LOW (ref 22–32)
Calcium: 8.6 mg/dL — ABNORMAL LOW (ref 8.9–10.3)
Chloride: 121 mmol/L — ABNORMAL HIGH (ref 98–111)
Creatinine, Ser: 1.74 mg/dL — ABNORMAL HIGH (ref 0.61–1.24)
GFR, Estimated: 38 mL/min — ABNORMAL LOW (ref >60)
Glucose, Bld: 164 mg/dL — ABNORMAL HIGH (ref 70–99)
Potassium: 3.1 mmol/L — ABNORMAL LOW (ref 3.5–5.1)
Sodium: 142 mmol/L (ref 135–145)

## 2021-09-23 LAB — HEPARIN LEVEL (UNFRACTIONATED): Heparin Unfractionated: <0.1 IU/mL — ABNORMAL LOW (ref 0.30–0.70)

## 2021-09-23 SURGERY — FLUOROSCOPY GUIDANCE
Anesthesia: LOCAL

## 2021-09-23 SURGERY — TEMPORARY PACEMAKER
Anesthesia: LOCAL

## 2021-09-23 MED ORDER — SODIUM BICARBONATE 8.4 % IV SOLN
INTRAVENOUS | Status: AC
  Administered 2021-09-23: 11:00:00 50 meq via INTRAVENOUS
  Filled 2021-09-23: qty 50

## 2021-09-23 MED ORDER — SODIUM BICARBONATE 8.4 % IV SOLN
INTRAVENOUS | Status: AC
  Filled 2021-09-23 (×2): qty 1000

## 2021-09-23 MED ORDER — MIDAZOLAM HCL 2 MG/2ML IJ SOLN
1.0000 mg | INTRAMUSCULAR | Status: DC | PRN
  Administered 2021-09-23 (×2): 1 mg via INTRAVENOUS
  Filled 2021-09-23: qty 2

## 2021-09-23 MED ORDER — LIDOCAINE HCL (PF) 1 % IJ SOLN
INTRAMUSCULAR | Status: DC | PRN
  Administered 2021-09-23: 10 mL via INTRADERMAL

## 2021-09-23 MED ORDER — FUROSEMIDE 10 MG/ML IJ SOLN
40.0000 mg | Freq: Once | INTRAMUSCULAR | Status: AC
  Administered 2021-09-23: 11:00:00 40 mg via INTRAVENOUS

## 2021-09-23 MED ORDER — POTASSIUM CHLORIDE 20 MEQ PO PACK
20.0000 meq | PACK | ORAL | Status: AC
  Administered 2021-09-23 (×2): 20 meq via ORAL
  Filled 2021-09-23 (×2): qty 1

## 2021-09-23 MED ORDER — GUAIFENESIN 100 MG/5ML PO LIQD
5.0000 mL | Freq: Four times a day (QID) | ORAL | Status: DC | PRN
  Administered 2021-09-23: 06:00:00 5 mL via ORAL
  Filled 2021-09-23: qty 5

## 2021-09-23 MED ORDER — ORAL CARE MOUTH RINSE
15.0000 mL | OROMUCOSAL | Status: DC
  Administered 2021-09-23 – 2021-09-25 (×19): 15 mL via OROMUCOSAL

## 2021-09-23 MED ORDER — POTASSIUM CHLORIDE 10 MEQ/100ML IV SOLN
10.0000 meq | INTRAVENOUS | Status: AC
  Administered 2021-09-23 (×4): 10 meq via INTRAVENOUS
  Filled 2021-09-23 (×4): qty 100

## 2021-09-23 MED ORDER — LIDOCAINE HCL (PF) 1 % IJ SOLN
INTRAMUSCULAR | Status: AC
  Filled 2021-09-23: qty 30

## 2021-09-23 MED ORDER — ROCURONIUM BROMIDE 10 MG/ML (PF) SYRINGE
PREFILLED_SYRINGE | INTRAVENOUS | Status: AC
  Administered 2021-09-23: 09:00:00 50 mg via INTRAVENOUS
  Filled 2021-09-23: qty 10

## 2021-09-23 MED ORDER — MIDAZOLAM HCL 2 MG/2ML IJ SOLN
INTRAMUSCULAR | Status: DC | PRN
  Administered 2021-09-23: 2 mg via INTRAVENOUS

## 2021-09-23 MED ORDER — PANTOPRAZOLE SODIUM 40 MG IV SOLR: 40.0000 mg | Freq: Every day | INTRAVENOUS | Status: DC

## 2021-09-23 MED ORDER — HEPARIN (PORCINE) IN NACL 1000-0.9 UT/500ML-% IV SOLN
INTRAVENOUS | Status: DC | PRN
  Administered 2021-09-23: 500 mL

## 2021-09-23 MED ORDER — HEPARIN (PORCINE) IN NACL 1000-0.9 UT/500ML-% IV SOLN
INTRAVENOUS | Status: AC
  Filled 2021-09-23: qty 500

## 2021-09-23 MED ORDER — FENTANYL BOLUS VIA INFUSION
25.0000 ug | INTRAVENOUS | Status: DC | PRN
  Administered 2021-09-23 – 2021-09-24 (×2): 50 ug via INTRAVENOUS
  Administered 2021-09-24: 25 ug via INTRAVENOUS
  Filled 2021-09-23: qty 100

## 2021-09-23 MED ORDER — MIDAZOLAM HCL 2 MG/2ML IJ SOLN
1.0000 mg | INTRAMUSCULAR | Status: DC | PRN
  Filled 2021-09-23: qty 2

## 2021-09-23 MED ORDER — NOREPINEPHRINE 4 MG/250ML-% IV SOLN
0.0000 ug/min | INTRAVENOUS | Status: DC
  Administered 2021-09-23: 11:00:00 4 ug/min via INTRAVENOUS
  Administered 2021-09-24: 05:00:00 6 ug/min via INTRAVENOUS
  Administered 2021-09-25: 14:00:00 24 ug/min via INTRAVENOUS
  Administered 2021-09-25: 20:00:00 20 ug/min via INTRAVENOUS
  Administered 2021-09-25 (×2): 24 ug/min via INTRAVENOUS
  Administered 2021-09-25: 23:00:00 25 ug/min via INTRAVENOUS
  Administered 2021-09-26: 07:00:00 32 ug/min via INTRAVENOUS
  Administered 2021-09-26: 02:00:00 25 ug/min via INTRAVENOUS
  Administered 2021-09-26: 04:00:00 30 ug/min via INTRAVENOUS
  Filled 2021-09-23: qty 250
  Filled 2021-09-23: qty 750
  Filled 2021-09-23 (×8): qty 250

## 2021-09-23 MED ORDER — FUROSEMIDE 10 MG/ML IJ SOLN
INTRAMUSCULAR | Status: AC
  Filled 2021-09-23: qty 4

## 2021-09-23 MED ORDER — POTASSIUM CHLORIDE 10 MEQ/50ML IV SOLN
10.0000 meq | INTRAVENOUS | Status: AC
  Administered 2021-09-23 (×4): 10 meq via INTRAVENOUS
  Filled 2021-09-23 (×4): qty 50

## 2021-09-23 MED ORDER — ATORVASTATIN CALCIUM 40 MG PO TABS
40.0000 mg | ORAL_TABLET | Freq: Every day | ORAL | Status: DC
  Administered 2021-09-24 – 2021-09-25 (×2): 40 mg
  Filled 2021-09-23 (×3): qty 1

## 2021-09-23 MED ORDER — SODIUM CHLORIDE 0.9 % IV SOLN
2.0000 g | INTRAVENOUS | Status: DC
  Administered 2021-09-23 – 2021-09-25 (×3): 2 g via INTRAVENOUS
  Filled 2021-09-23 (×3): qty 20

## 2021-09-23 MED ORDER — SODIUM CHLORIDE 0.9% FLUSH: 3.0000 mL | INTRAVENOUS | Status: DC | PRN

## 2021-09-23 MED ORDER — FENTANYL 2500MCG IN NS 250ML (10MCG/ML) PREMIX INFUSION: 25.0000 ug/h | INTRAVENOUS | Status: DC

## 2021-09-23 MED ORDER — ROCURONIUM BROMIDE 50 MG/5ML IV SOLN: 50.0000 mg | Freq: Once | INTRAVENOUS | Status: AC

## 2021-09-23 MED ORDER — ETOMIDATE 2 MG/ML IV SOLN
INTRAVENOUS | Status: AC
  Administered 2021-09-23: 09:00:00 10 mg via INTRAVENOUS
  Filled 2021-09-23: qty 10

## 2021-09-23 MED ORDER — HEPARIN (PORCINE) IN NACL 1000-0.9 UT/500ML-% IV SOLN
INTRAVENOUS | Status: AC
  Filled 2021-09-23: qty 1000

## 2021-09-23 MED ORDER — FENTANYL CITRATE PF 50 MCG/ML IJ SOSY
100.0000 ug | PREFILLED_SYRINGE | Freq: Once | INTRAMUSCULAR | Status: AC
  Administered 2021-09-23: 09:00:00 100 ug via INTRAVENOUS

## 2021-09-23 MED ORDER — FENTANYL CITRATE PF 50 MCG/ML IJ SOSY
PREFILLED_SYRINGE | INTRAMUSCULAR | Status: AC
  Filled 2021-09-23: qty 2

## 2021-09-23 MED ORDER — SODIUM CHLORIDE 0.9 % IV SOLN: 250.0000 mL | INTRAVENOUS | Status: DC | PRN

## 2021-09-23 MED ORDER — SODIUM CHLORIDE 0.9% FLUSH
3.0000 mL | Freq: Two times a day (BID) | INTRAVENOUS | Status: DC
  Administered 2021-09-23 – 2021-09-24 (×4): 3 mL via INTRAVENOUS

## 2021-09-23 MED ORDER — ETOMIDATE 2 MG/ML IV SOLN: 10.0000 mg | Freq: Once | INTRAVENOUS | Status: AC

## 2021-09-23 MED ORDER — SENNOSIDES-DOCUSATE SODIUM 8.6-50 MG PO TABS
1.0000 | ORAL_TABLET | Freq: Two times a day (BID) | ORAL | Status: DC
  Administered 2021-09-24 – 2021-09-25 (×4): 1
  Filled 2021-09-23 (×5): qty 1

## 2021-09-23 MED ORDER — GUAIFENESIN 100 MG/5ML PO LIQD
5.0000 mL | Freq: Four times a day (QID) | ORAL | Status: DC | PRN
  Administered 2021-09-24: 13:00:00 5 mL
  Filled 2021-09-23: qty 5

## 2021-09-23 MED ORDER — MIDAZOLAM HCL 2 MG/2ML IJ SOLN
INTRAMUSCULAR | Status: AC
  Filled 2021-09-23: qty 2

## 2021-09-23 MED ORDER — SODIUM BICARBONATE 8.4 % IV SOLN: 50.0000 meq | Freq: Once | INTRAVENOUS | Status: AC

## 2021-09-23 MED ORDER — CHLORHEXIDINE GLUCONATE 0.12% ORAL RINSE (MEDLINE KIT)
15.0000 mL | Freq: Two times a day (BID) | OROMUCOSAL | Status: DC
  Administered 2021-09-23 – 2021-09-25 (×5): 15 mL via OROMUCOSAL

## 2021-09-23 MED ORDER — FENTANYL CITRATE PF 50 MCG/ML IJ SOSY: 25.0000 ug | PREFILLED_SYRINGE | Freq: Once | INTRAMUSCULAR | Status: DC

## 2021-09-23 SURGICAL SUPPLY — 10 items
CABLE ADAPT PACING TEMP 12FT (ADAPTER) ×1 IMPLANT
CATH S G BIP PACING (CATHETERS) ×1 IMPLANT
PINNACLE LONG 6F 25CM (SHEATH) ×2
PROTECTION STATION PRESSURIZED (MISCELLANEOUS) ×2
SHEATH INTRO PINNACLE 6F 25CM (SHEATH) IMPLANT
SHEATH PROBE COVER 6X72 (BAG) ×1 IMPLANT
SLEEVE REPOSITIONING LENGTH 30 (MISCELLANEOUS) ×1 IMPLANT
STATION PROTECTION PRESSURIZED (MISCELLANEOUS) IMPLANT
WIRE EMERALD 3MM-J .035X150CM (WIRE) ×1 IMPLANT
WIRE MICRO SET SILHO 5FR 7 (SHEATH) ×1 IMPLANT

## 2021-09-23 NOTE — Procedures (Signed)
Arterial Catheter Insertion Procedure Note  Aaron Carlson  [id number]  September 14, 1933  Date:09/15/2021  Time:9:51 AM    Provider Performing: [name]    Procedure: Insertion of Arterial Line [id number]) with US guidance (57903)   Indication(s) Blood pressure monitoring and/or need for frequent ABGs  Consent Risks of the procedure as well as the alternatives and risks of each were explained to the patient and/or caregiver.  Consent for the procedure was obtained and is signed in the bedside chart  Anesthesia None   Time Out Verified patient identification, verified procedure, site/side was marked, verified correct patient position, special equipment/implants available, medications/allergies/relevant history reviewed, required imaging and test results available.   Sterile Technique Maximal sterile technique including full sterile barrier drape, hand hygiene, sterile gown, sterile gloves, mask, hair covering, sterile ultrasound probe cover (if used).   Procedure Description Area of catheter insertion was cleaned with chlorhexidine and draped in sterile fashion. With real-time ultrasound guidance an arterial catheter was placed into the right femoral artery.  Appropriate arterial tracings confirmed on monitor.     Complications/Tolerance None; patient tolerated the procedure well.   EBL Minimal   Specimen(s) None     [name], ACNP [facility] 09/11/2021, 9:52 AM  See Amion for pager If no response to pager, please call PCCM consult pager After 7:00 pm call Elink

## 2021-09-23 NOTE — Care Management (Signed)
°  Transition of Care [facility]) Screening Note   Patient Details  Name: ORLONDO HOLYCROSS Date of Birth: 09-22-1933   Transition of Care [facility]) CM/SW Contact:    Carles Collet, RN Phone Number: 09/22/2021, 4:18 PM    Transition of Care Department [facility]) has reviewed patient and no TOC needs have been identified at this time. We will continue to monitor patient advancement through interdisciplinary progression rounds. If new patient transition needs arise, please place a TOC consult.

## 2021-09-23 NOTE — Progress Notes (Signed)
OT Cancellation Note  Patient Details Name: Aaron Carlson MRN: [id number] DOB: [birth year]   Cancelled Treatment:    Reason Eval/Treat Not Completed: Medical issues which prohibited therapy (Pt with code blue and intubated since OT ordered, will sign off and await new order.)  [name] 09/02/2021, 9:03 AM

## 2021-09-23 NOTE — Progress Notes (Addendum)
Progress Note  Patient Name: Aaron Carlson Date of Encounter: 09/03/2021  [facility] Cardiologist: Sinclair Grooms, MD   Subjective  \ This morning, upon entering the ICU, CODE BLUE was being called in the patient's room for presumed PEA arrest.  On review of EKG appears to be asystole followed by VT.  Chest compressions being performed.  Patient intubated plans for line placement.  Inpatient Medications    Scheduled Meds:   stroke: mapping our early stages of recovery book   Does not apply Once   acetaminophen  650 mg Oral Q4H   Or   acetaminophen (TYLENOL) oral liquid 160 mg/5 mL  650 mg Per Tube Q4H   Or   acetaminophen  650 mg Rectal Q4H   atorvastatin  40 mg Oral Daily   chlorhexidine gluconate (MEDLINE KIT)  15 mL Mouth Rinse BID   Chlorhexidine Gluconate Cloth  6 each Topical Daily   docusate  100 mg Per Tube BID   fentaNYL (SUBLIMAZE) injection  25 mcg Intravenous Once   fentaNYL (SUBLIMAZE) injection  25 mcg Intravenous Once   insulin aspart  0-15 Units Subcutaneous Q4H   lidocaine  1 patch Transdermal Q24H   mouth rinse  15 mL Mouth Rinse 10 times per day   pantoprazole sodium  40 mg Per Tube QHS   polyethylene glycol  17 g Per Tube Daily   potassium chloride  20 mEq Oral Once   senna-docusate  1 tablet Oral BID   Continuous Infusions:  sodium chloride Stopped (09/21/21 1539)   sodium chloride 100 mL/hr at 09/22/2021 0700   epinephrine     fentaNYL infusion INTRAVENOUS Stopped (09/21/21 1120)   promethazine (PHENERGAN) injection (IM or IVPB)     PRN Meds: acetaminophen **OR** acetaminophen (TYLENOL) oral liquid 160 mg/5 mL **OR** acetaminophen, etomidate, fentaNYL, gadobutrol, guaiFENesin, hydrALAZINE, midazolam, midazolam, ondansetron (ZOFRAN) IV   Vital Signs    Vitals:   09/12/2021 0600 09/22/2021 0630 09/14/2021 0700 09/19/2021 0842  BP: 129/70 128/61 119/65   Pulse: 98 97 (!) 105   Resp: (!) 33 (!) 27 (!) 37   Temp:   (!) 97.4 F (36.3 C)    TempSrc:   Axillary   SpO2: 98% 96% 95% 98%  Weight: 90.6 kg     Height:        Intake/Output Summary (Last 24 hours) at 09/19/2021 0845 Last data filed at 09/22/2021 0700 Gross per 24 hour  Intake 2367.37 ml  Output 1300 ml  Net 1067.37 ml   Last 3 Weights 09/01/2021 09/22/2021 06/19/2021  Weight (lbs) 199 lb 11.8 oz 202 lb 9.6 oz 206 lb  Weight (kg) 90.6 kg 91.9 kg 93.441 kg      Telemetry   This morning he had short sinus pause/complete block with several seconds essentially asystole followed by a prolonged asystole over 10 seconds.  Is also had intermittent V. tach. Currently transcutaneously paced at rate 70 bpm.   Personally Reviewed  ECG    N/A this morning upon  Physical Exam   GEN: Being evaluated during CPR.  Intubated Neck: No JVD Cardiac: Now RRR.  1/6 SEM Respiratory: Diminished breath sounds, upper respiratory. GI: Soft/NT/ND NABS. MS: No C/C/C Neuro: Noted right facial droop, now intubated and sedated.   Labs    High Sensitivity Troponin:   Recent Labs  Lab 09/27/2021 1806 08/28/2021 2244 09/21/21 0807  TROPONINIHS 25* 481* 7,052*     Chemistry Recent Labs  Lab 09/25/2021 1806 09/04/2021 1823  09/21/21 0807 09/22/21 0133 09/03/2021 0036  NA 137   < > 140 140 142  K 3.6   < > 3.8 3.5 3.1*  CL 110   < > 116* 117* 121*  CO2 19*  --  17* 15* 14*  GLUCOSE 331*   < > 147* 198* 164*  BUN 38*   < > 37* 40* 42*  CREATININE 2.18*   < > 1.94* 1.97* 1.74*  CALCIUM 8.4*  --  8.4* 8.3* 8.6*  MG  --   --  2.0 1.9 2.4  PROT 6.2*  --   --   --   --   ALBUMIN 3.4*  --   --   --   --   AST 35  --   --   --   --   ALT 36  --   --   --   --   ALKPHOS 90  --   --   --   --   BILITOT 0.7  --   --   --   --   GFRNONAA 29*  --  33* 32* 38*  ANIONGAP 8  --  _0 < > = values in this interval not displayed.    Lipids  Recent Labs  Lab 09/21/21 0807  CHOL 147  TRIG 51  HDL 45  LDLCALC 92  CHOLHDL 3.3    Hematology Recent Labs  Lab 09/21/21 0807  09/22/21 0133 09/21/2021 0036  WBC 12.7* 13.2* 13.6*  RBC 3.13* 3.25* 3.14*  HGB 9.4* 9.9* 9.6*  HCT 28.1* 29.6* 28.7*  MCV 89.8 91.1 91.4  MCH 30.0 30.5 30.6  MCHC 33.5 33.4 33.4  RDW 14.0 14.4 14.7  PLT 151 155 167   Thyroid  Recent Labs  Lab 09/17/2021 2244  TSH 3.723  FREET4 0.78    BNPNo results for input(s): BNP, PROBNP in the last 168 hours.  DDimer No results for input(s): DDIMER in the last 168 hours.   Radiology    CT HEAD WO CONTRAST (5MM)  Result Date: 09/03/2021 CLINICAL DATA:  Hemorrhagic stroke follow-up EXAM: CT HEAD WITHOUT CONTRAST TECHNIQUE: Contiguous axial images were obtained from the base of the skull through the vertex without intravenous contrast. COMPARISON:  Yesterday FINDINGS: Brain: 29 x 40 mm hematoma the left frontal parietal junction with regional low-density swelling that is mildly progressed. Anteriorly is a low-density space that has filled in with blood. Still no midline shift of the atrophic brain. Small volume local subarachnoid hemorrhage. No hydrocephalus. Vascular: Atheromatous calcification. Skull: Negative for fracture.  Left parietal scalp swelling Sinuses/Orbits: Negative IMPRESSION: Mildly increased edema around the left cerebral hemorrhage which is not significantly increased in size. Electronically Signed   By: Jorje Guild M.D.   On: 09/27/2021 06:31   MR BRAIN W WO CONTRAST  Result Date: 09/22/2021 CLINICAL DATA:  Stroke. Follow-up intracranial hemorrhage. Assess for underlying lesion. EXAM: MRI HEAD WITHOUT AND WITH CONTRAST TECHNIQUE: Multiplanar, multiecho pulse sequences of the brain and surrounding structures were obtained without and with intravenous contrast. CONTRAST:  45m GADAVIST GADOBUTROL 1 MMOL/ML IV SOLN COMPARISON:  09/07/2021.  CT earlier same day. FINDINGS: Brain: The study suffers from considerable motion degradation. No focal abnormality is seen affecting the brainstem or cerebellum. Right cerebral hemisphere shows  mild chronic small-vessel ischemic changes of the white matter. Left cerebral hemisphere shows a 4.2 x 3 cm in diameter hematoma at the left frontoparietal junction with surrounding edema. After contrast administration, there  are a few punctate foci of contrast enhancement or accumulation along the posterior wall. The possibility of ongoing bleeding does exist, and it may be prudent to follow this with an additional CT in a few hours to make sure the hematoma is not enlarging further. There is no clear underlying mass lesion, but that is not completely excluded and should be evaluated subsequently. There is no midline shift. No hydrocephalus. No extra-axial collection. Vascular: Major vessels at the base of the brain show flow. Skull and upper cervical spine: Negative Sinuses/Orbits: Clear/normal Other: None IMPRESSION: Considerable motion degradation. Redemonstration of an intraparenchymal hematoma at the left frontoparietal junction measuring approximately 3 x 4.2 cm as seen previously. Surrounding edema. Question of punctate enhancement or contrast accumulation along the posterior wall. This could represent slow ongoing bleeding, and I would suggest repeat CT evaluation in several hours to make sure that the hematoma is not enlarging further. At this point, we have not diagnose an underlying mass lesion, but the patient should be restudied after recovery from the acute phase. Electronically Signed   By: Nelson Chimes M.D.   On: 09/22/2021 11:57   DG Swallowing Func-Speech Pathology  Result Date: 09/22/2021 Table formatting from the original result was not included. Objective Swallowing Evaluation: Type of Study: MBS-Modified Barium Swallow Study  Patient Details Name: Aaron Carlson MRN: [id number] Date of Birth: 03/23/1933 Today's Date: 09/22/2021 Time: SLP Start Time (ACUTE ONLY): 1458 -SLP Stop Time (ACUTE ONLY): 1516 SLP Time Calculation (min) (ACUTE ONLY): 18 min Past Medical History: Past Medical  History: Diagnosis Date  CAD in native artery 08/28/2013  Heaviness in chest with exertion. Coronary angiography 2005 demonstrated severe distal LAD and diagonal disease, severe distal RCA and PL OM disease, and severe first obtuse marginal disease. No high-grade proximal coronary disease was present at the time.  The most recent myocardial perfusion study in 2014 was nonischemic/low risk    Chronic renal insufficiency   Colon polyps   adenomatous  Diabetes mellitus   Hyperlipemia   Hypertension   Laryngeal papillomatosis   Prostate cancer ([place])   w/ mets to bladder Past Surgical History: Past Surgical History: Procedure Laterality Date  BLADDER SURGERY    CATARACT EXTRACTION Left 02/2020  CIRCUMCISION  2011  POLYPECTOMY    vocal cords  PROSTATE SURGERY   HPI: Pt is an 87 yo male presenting with syncope and found unresponsive. Pt went into PEA arrest requiring CPR x3 minutes with ROSC. ETT 12/24-12/25. Pt initially passed a yale swallow screen post-extubation but then a code stroke was called in the early morning on 12/26 due to R sided weakness and slurred speech. CTH on admission negative for acute changes but repeat on 12/26 revealed acute L sided ICH. MRI with considerable motion degradation but redmonstrates intraparenchymal hematoma at the L frontoparietal junction with surrounding edema. PMH significant for recurrent laryngeal papillomatosis s/p laser excision, CAD, DM2, HTN, HLD, Prostrate Cancer  Subjective: lethargic  Recommendations for follow up therapy are one component of a multi-disciplinary discharge planning process, led by the attending physician.  Recommendations may be updated based on patient status, additional functional criteria and insurance authorization. Assessment / Plan / Recommendation Clinical Impressions 09/22/2021 Clinical Impression Pt has a moderate oropharyngeal dysphagia due to sensory and motor deficits, perhaps further exacerbated during this study given pt lethargy. He has weak  labial seal on the R that allows for anterior loss, as well as reduced lingual propulsion and reduced coordination for posterior transit. Oral residue remains, especially  on his R side and in his anterior sulcus. His bolus cohesion is reduced and his timing is inconsistent for swallow trigger. Most consistencies initiate a swallow at the pyriform sinuses at least intermittently, although the thinner the consistency, the more consistently it reaches the pyriform sinuses before the swallow. Thin and nectar thick liquids more consistently enter the airway before the swallow in small quantities, but aspiration is silent and his cued cough is too weak to clear his airway. Pt can contain honey thick liquids and purees in his valleculae at times, but deep penetration to the vocal folds occurs when his timing is off. There is also more residue in the valleculae and posterior pharyngeal wall due to reduced base of tongue retraction and pharyngeal squeeze. A chin tuck helps to improve airway protection and slightly reduce residue with purees. It helps with honey thick liquids as well, but he can't get boluses out of a straw and he can't coordinate a chin tuck with cup sips. A spoon had to be used. Recommend starting with Dys 1 (puree) diet and honey thick liquids by spoon with full supervision for use of a chin tuck. Anticipate that it would be hard for him to meet his nutritional needs with such restrictions and with current lethargy, so may want to consider temporary alternative means of nutrition to supplement PO diet. Will f/u for ongoing dysphagia management. SLP Visit Diagnosis Dysphagia, oropharyngeal phase (R13.12) Attention and concentration deficit following -- Frontal lobe and executive function deficit following -- Impact on safety and function Moderate aspiration risk;Risk for inadequate nutrition/hydration   Treatment Recommendations 09/22/2021 Treatment Recommendations Therapy as outlined in treatment plan  below   Prognosis 09/22/2021 Prognosis for Safe Diet Advancement Good Barriers to Reach Goals -- Barriers/Prognosis Comment -- Diet Recommendations 09/22/2021 SLP Diet Recommendations Dysphagia 1 (Puree) solids;Honey thick liquids Liquid Administration via Spoon Medication Administration Crushed with puree Compensations Minimize environmental distractions;Slow rate;Small sips/bites;Lingual sweep for clearance of pocketing;Monitor for anterior loss;Chin tuck Postural Changes Seated upright at 90 degrees;Remain semi-upright after after feeds/meals (Comment)   Other Recommendations 09/22/2021 Recommended Consults -- Oral Care Recommendations Oral care BID Other Recommendations Order thickener from pharmacy;Prohibited food (jello, ice cream, thin soups);Remove water pitcher;Have oral suction available Follow Up Recommendations Acute inpatient rehab (3hours/day) Assistance recommended at discharge Intermittent Supervision/Assistance Functional Status Assessment Patient has had a recent decline in their functional status and demonstrates the ability to make significant improvements in function in a reasonable and predictable amount of time. Frequency and Duration  09/22/2021 Speech Therapy Frequency (ACUTE ONLY) min 2x/week Treatment Duration 2 weeks   Oral Phase 09/22/2021 Oral Phase Impaired Oral - Pudding Teaspoon -- Oral - Pudding Cup -- Oral - Honey Teaspoon Decreased bolus cohesion;Lingual/palatal residue;Reduced posterior propulsion;Right anterior bolus loss Oral - Honey Cup Decreased bolus cohesion;Lingual/palatal residue;Reduced posterior propulsion;Right anterior bolus loss Oral - Nectar Teaspoon -- Oral - Nectar Cup Decreased bolus cohesion;Lingual/palatal residue;Reduced posterior propulsion;Right anterior bolus loss Oral - Nectar Straw Decreased bolus cohesion;Lingual/palatal residue;Reduced posterior propulsion Oral - Thin Teaspoon -- Oral - Thin Cup Decreased bolus cohesion;Lingual/palatal residue;Reduced  posterior propulsion;Right anterior bolus loss Oral - Thin Straw -- Oral - Puree Decreased bolus cohesion;Lingual/palatal residue;Reduced posterior propulsion;Right anterior bolus loss Oral - Mech Soft -- Oral - Regular -- Oral - Multi-Consistency -- Oral - Pill -- Oral Phase - Comment --  Pharyngeal Phase 09/22/2021 Pharyngeal Phase Impaired Pharyngeal- Pudding Teaspoon -- Pharyngeal -- Pharyngeal- Pudding Cup -- Pharyngeal -- Pharyngeal- Honey Teaspoon Reduced pharyngeal peristalsis;Reduced tongue base retraction;Pharyngeal  residue - valleculae;Delayed swallow initiation-pyriform sinuses;Compensatory strategies attempted (with notebox) Pharyngeal -- Pharyngeal- Honey Cup Reduced pharyngeal peristalsis;Reduced tongue base retraction;Penetration/Apiration after swallow;Pharyngeal residue - valleculae;Delayed swallow initiation-pyriform sinuses Pharyngeal Material enters airway, CONTACTS cords and not ejected out Pharyngeal- Nectar Teaspoon -- Pharyngeal -- Pharyngeal- Nectar Cup Delayed swallow initiation-pyriform sinuses;Penetration/Aspiration before swallow;Reduced tongue base retraction;Pharyngeal residue - valleculae Pharyngeal Material enters airway, CONTACTS cords and not ejected out Pharyngeal- Nectar Straw Delayed swallow initiation-pyriform sinuses;Penetration/Aspiration before swallow;Reduced tongue base retraction;Pharyngeal residue - valleculae Pharyngeal Material enters airway, passes BELOW cords without attempt by patient to eject out (silent aspiration) Pharyngeal- Thin Teaspoon -- Pharyngeal -- Pharyngeal- Thin Cup Delayed swallow initiation-pyriform sinuses;Penetration/Aspiration before swallow;Reduced tongue base retraction Pharyngeal Material enters airway, passes BELOW cords without attempt by patient to eject out (silent aspiration) Pharyngeal- Thin Straw -- Pharyngeal -- Pharyngeal- Puree Reduced pharyngeal peristalsis;Reduced tongue base retraction;Penetration/Apiration after  swallow;Pharyngeal residue - valleculae;Delayed swallow initiation-pyriform sinuses;Penetration/Aspiration during swallow;Reduced airway/laryngeal closure Pharyngeal Material enters airway, CONTACTS cords and not ejected out Pharyngeal- Mechanical Soft -- Pharyngeal -- Pharyngeal- Regular -- Pharyngeal -- Pharyngeal- Multi-consistency -- Pharyngeal -- Pharyngeal- Pill -- Pharyngeal -- Pharyngeal Comment --  Cervical Esophageal Phase  09/22/2021 Cervical Esophageal Phase WFL Pudding Teaspoon -- Pudding Cup -- Honey Teaspoon -- Honey Cup -- Nectar Teaspoon -- Nectar Cup -- Nectar Straw -- Thin Teaspoon -- Thin Cup -- Thin Straw -- Puree -- Mechanical Soft -- Regular -- Multi-consistency -- Pill -- Cervical Esophageal Comment -- Osie Bond., M.A. [place] Pager [phone] Office [phone] 09/22/2021, 4:29 PM                     ECHOCARDIOGRAM COMPLETE  Result Date: 09/21/2021    ECHOCARDIOGRAM REPORT   Patient Name:   Aaron Carlson Date of Exam: 09/21/2021 Medical Rec #:  [id number]       Height:       68.0 in Accession #:    [id number]      Weight:       202.6 lb Date of Birth:  05-07-1933       BSA:          2.055 m Patient Age:    20 years        BP:           105/53 mmHg Patient Gender: M               HR:           73 bpm. Exam Location:  Inpatient Procedure: 2D Echo Indications:     cardiac arrest  History:         Patient has prior history of Echocardiogram examinations, most                  recent 12/06/2020. CAD; Risk Factors:Diabetes and Hypertension.  Sonographer:     Johny Chess RDCS Referring Phys:  [id number] Estill Cotta Diagnosing Phys: Glori Bickers MD IMPRESSIONS  1. Left ventricular ejection fraction, by estimation, is 50 to 55%. The left ventricle has low normal function. The left ventricle has no regional wall motion abnormalities. Left ventricular diastolic parameters are consistent with Grade I diastolic dysfunction (impaired relaxation). There is  hypokinesis of the left ventricular, basal-mid inferior wall and inferolateral wall.  2. Right ventricular systolic function is normal. The right ventricular size is normal. There is normal pulmonary artery systolic pressure.  3. Left atrial size was mildly dilated.  4. The mitral valve is normal in structure.  No evidence of mitral valve regurgitation. No evidence of mitral stenosis.  5. The aortic valve has an indeterminant number of cusps. There is moderate calcification of the aortic valve. Aortic valve regurgitation is not visualized. Aortic valve sclerosis/calcification is present, without any evidence of aortic stenosis.  6. The inferior vena cava is normal in size with greater than 50% respiratory variability, suggesting right atrial pressure of 3 mmHg. FINDINGS  Left Ventricle: Left ventricular ejection fraction, by estimation, is 50 to 55%. The left ventricle has low normal function. The left ventricle has no regional wall motion abnormalities. The left ventricular internal cavity size was normal in size. There is no left ventricular hypertrophy. Left ventricular diastolic parameters are consistent with Grade I diastolic dysfunction (impaired relaxation). Right Ventricle: The right ventricular size is normal. No increase in right ventricular wall thickness. Right ventricular systolic function is normal. There is normal pulmonary artery systolic pressure. The tricuspid regurgitant velocity is 2.08 m/s, and  with an assumed right atrial pressure of 3 mmHg, the estimated right ventricular systolic pressure is 54.2 mmHg. Left Atrium: Left atrial size was mildly dilated. Right Atrium: Right atrial size was normal in size. Pericardium: There is no evidence of pericardial effusion. Mitral Valve: The mitral valve is normal in structure. No evidence of mitral valve regurgitation. No evidence of mitral valve stenosis. Tricuspid Valve: The tricuspid valve is normal in structure. Tricuspid valve regurgitation is not  demonstrated. No evidence of tricuspid stenosis. Aortic Valve: The aortic valve has an indeterminant number of cusps. There is moderate calcification of the aortic valve. Aortic valve regurgitation is not visualized. Aortic valve sclerosis/calcification is present, without any evidence of aortic stenosis. Pulmonic Valve: The pulmonic valve was normal in structure. Pulmonic valve regurgitation is not visualized. No evidence of pulmonic stenosis. Aorta: The aortic root is normal in size and structure. Venous: The inferior vena cava is normal in size with greater than 50% respiratory variability, suggesting right atrial pressure of 3 mmHg. IAS/Shunts: No atrial level shunt detected by color flow Doppler.  LEFT VENTRICLE PLAX 2D LVIDd:         3.80 cm   Diastology LVIDs:         2.30 cm   LV e' medial:    4.68 cm/s LV PW:         1.00 cm   LV E/e' medial:  12.0 LV IVS:        1.10 cm   LV e' lateral:   5.66 cm/s LVOT diam:     1.90 cm   LV E/e' lateral: 9.9 LV SV:         43 LV SV Index:   21 LVOT Area:     2.84 cm  RIGHT VENTRICLE            IVC RV S prime:     6.20 cm/s  IVC diam: 1.50 cm TAPSE (M-mode): 1.5 cm LEFT ATRIUM             Index        RIGHT ATRIUM          Index LA diam:        2.50 cm 1.22 cm/m   RA Area:     9.71 cm LA Vol (A2C):   44.2 ml 21.51 ml/m  RA Volume:   21.20 ml 10.32 ml/m LA Vol (A4C):   19.7 ml 9.59 ml/m LA Biplane Vol: 32.0 ml 15.57 ml/m  AORTIC VALVE LVOT Vmax:   85.90 cm/s  LVOT Vmean:  53.400 cm/s LVOT VTI:    0.152 m  AORTA Ao Root diam: 2.90 cm Ao Asc diam:  3.10 cm MITRAL VALVE               TRICUSPID VALVE MV Area (PHT): 3.12 cm    TR Peak grad:   17.3 mmHg MV Decel Time: 243 msec    TR Vmax:        208.00 cm/s MV E velocity: 56.20 cm/s MV A velocity: 66.30 cm/s  SHUNTS MV E/A ratio:  0.85        Systemic VTI:  0.15 m                            Systemic Diam: 1.90 cm Glori Bickers MD Electronically signed by Glori Bickers MD Signature Date/Time: 09/21/2021/3:01:56 PM     Final (Updated)    CT HEAD CODE STROKE WO CONTRAST  Result Date: 09/22/2021 CLINICAL DATA:  Code stroke.  87 year old male. EXAM: CT HEAD WITHOUT CONTRAST TECHNIQUE: Contiguous axial images were obtained from the base of the skull through the vertex without intravenous contrast. COMPARISON:  Head CT 09/07/2021. FINDINGS: Brain: Rounded intra-axial mixed density hemorrhage encompasses 42 by 30 by 36 mm (AP by transverse by CC) and there is a small volume of extra-axial extension of blood into the regional subarachnoid space. This is centered in the left perirolandic region, posterior left frontal and anterior parietal lobes just above the posterior operculum. There is surrounding edema. Mild regional mass effect. But no midline shift. No intraventricular extension of blood identified. No ventriculomegaly. Basilar cisterns remain patent and normal. No superimposed evidence of cortically based acute infarction. Gray-white matter differentiation outside of the area of acute hemorrhage is stable from 2 days ago. Vascular: Calcified atherosclerosis at the skull base. No suspicious intracranial vascular hyperdensity. Skull: No skull fracture identified. Sinuses/Orbits: Visualized paranasal sinuses and mastoids are stable and well aerated. Other: Broad-based left posterior convexity scalp hematoma. No scalp soft tissue gas. Underlying calvarium appears stable and intact. Orbits appear stable, negative. ASPECTS [facility] Stroke Program Early CT Score) - Ganglionic level infarction (caudate, lentiform nuclei, internal capsule, insula, M1-M3 cortex): - Supraganglionic infarction (M4-M6 cortex): Total score (0-10 with 10 being normal): IMPRESSION: 1. Acute left side 23 mL intra-axial and perirolandic hemorrhage with small volume extension of subarachnoid blood. Regional edema, but no midline shift. No IVH or ventriculomegaly. 2. Left posterior convexity scalp hematoma without underlying skull fracture. 3. No other acute  intracranial abnormality. 4. These results were communicated to Dr. Lorrin Goodell at 4:36 am on 09/22/2021 by text page via the [facility] messaging system. Electronically Signed   By: Genevie Ann M.D.   On: 09/22/2021 04:37    Cardiac Studies   TTE 09/21/2021: EF 50 to 55%.  Low normal function.  Basal-mid inferior and inferolateral hypokinesis.  GR 1 DD.  Normal RV size and function normal PAP.  Mild LA dilation.  Moderate aortic valve calcification/sclerosis but no stenosis.  Normal RAP.   Patient Profile     87 y.o. male presented to the hospital with an episode of syncope.  Found to have an elevated troponin.  Metoprolol was held and conduction has recovered without further episodes of bradycardia.  Had an intracranial hemorrhage overnight.  Assessment & Plan    1.  Transient 3rd AV Block over weekend - underlying RBBB/LAFB. ->  Now with prolonged sinus pause/asystole/complete heart block Plan was to hold beta-blocker with  plans to try to avoid a pacemaker. Currently being transcutaneously paced following cardiac arrest with CPR and reintubation. This is complicated by his intracranial hemorrhage.   May consider temporary pacemaker to stabilize to allow for more time for neurologic recovery -> we will monitor for signs stabilization postarrest.  Need to determine goals of care.= will re-engage EP to follow   2.  Known CAD with troponin elevation to 7052; unsure if this is a clinically ACS versus troponin elevation from neurologic injury.   Initially on heparin, discontinued due to [place].  Thankfully, EF is stable but does have regional wall motion normality.  No plans for invasive evaluation at this time.  3.  Chronic diastolic heart failure: Seems euvolemic.  Monitor I's and O's..   4.  Hyperlipidemia: Continue atorvastatin.  5.  Intracranial hemorrhage:  Followed by neurology.   Pending MRI once stabilized.   Precludes ability to perform invasive cardiac evaluation i.e. cardiac catheterization  and PCI.   For questions or updates, please contact [place] Please consult [email or web address] for contact info under    20-minute family talk in addition to 45 minutes with patient and chart.  Discussed transcutaneous placement from placement with the family with risk benefits alternatives and indications.  Family is in complete agreement to proceed. Shared Decision Making/Informed Consent: Risks include but not limited to bleeding, infection, pneumothorax, arrhythmia/ventricular tachycardia or bradycardic arrest with minimal chance of RA/RV perforation.    CRITICAL CARE  The patient is critically ill with multiple organ systems failure and requires high complexity decision making for assessment and support, frequent evaluation and titration of therapies, application of advanced monitoring technologies and extensive interpretation of multiple databases.    Critical Care Time devoted to patient care services described in this note is  65 Minutes.   Performed HT:DSKAJ [place] care time was exclusive of separately billable procedures and treating other patients.  Critical care was necessary to treat or prevent imminent or life-threatening deterioration.  Critical care was time spent personally by me on the following activities: development of treatment plan with patient and/or surrogate as well as nursing, discussions with consultants, evaluation of patient's response to treatment, examination of patient, obtaining history from patient or surrogate, ordering and performing treatments and interventions, ordering and review of laboratory studies, ordering and review of radiographic studies, pulse oximetry and re-evaluation of patient's condition.  This time reflects time of care of this signee Glenetta Hew, MD)  This critical care time does not reflect procedure time, or teaching time or supervisory time of PA/NP/Med student/Med Resident etc but could involve care discussion time  with the patient, family & RN staff.          Signed, Glenetta Hew, MD  09/03/2021, 8:45 AM

## 2021-09-23 NOTE — Progress Notes (Addendum)
RT NOTE: RT transported patient on ventilator from room 2H23 to CT and back to room 2H23 with no apparent complications. Vitals are stable. RT will continue to monitor.

## 2021-09-23 NOTE — Progress Notes (Signed)
Pt was noted to have issues with his pacing wire in the ICU. The pacemaker was not pacing. He was brought back to the cath lab and under fluoroscopy, the pacing wire was noted to be pulled back into the IVC. The pacing wire was advanced into the RV apex under fluoroscopy. The pacing wire is sensing and pacing appropriately. 2 mg Versed IV for sedation in the cath lab.   [name] 09/17/2021 12:22 PM

## 2021-09-23 NOTE — Progress Notes (Signed)
RT NOTE: RT transported patient on ventilator from room 2H23 to cath lab and back to room 2H23 with no apparent complications. Vitals are stable. RT will continue to monitor.

## 2021-09-23 NOTE — Progress Notes (Signed)
This chaplain is present for F/U spiritual care with the Pt. Family. The Pt. Grand-daughter-Zanetta provided the chaplain with an update and appreciated the visit.  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  Family continues to visit the Pt.  The chaplain will provide F/U spiritual care as needed.  Chaplain Sallyanne Kuster [phone]

## 2021-09-23 NOTE — Progress Notes (Signed)
[facility] ADULT ICU REPLACEMENT PROTOCOL   The patient does apply for the [facility] Adult ICU Electrolyte Replacment Protocol based on the criteria listed below:   1.Exclusion criteria: TCTS patients, ECMO patients, and Dialysis patients 2. Is GFR >/= 30 ml/min? Yes.    Patient's GFR today is 38 3. Is SCr </= 2? Yes.   Patient's SCr is 1.74 mg/dL 4. Did SCr increase >/= 0.5 in 24 hours? No. 5.Pt's weight >40kg  Yes.   6. Abnormal electrolyte(s): K+ 3.1  7. Electrolytes replaced per protocol 8.  Call MD STAT for K+ </= 2.5, Phos </= 1, or Mag </= 1 Physician:  n/a  Darlys Gales 09/09/2021 2:01 AM

## 2021-09-23 NOTE — Progress Notes (Signed)
Carotid artery duplex completed. Refer to "CV Proc" under chart review to view preliminary results.  09/07/2021 3:17 PM Kelby Aline., MHA, RVT, RDCS, RDMS

## 2021-09-23 NOTE — Interval H&P Note (Signed)
History and Physical Interval Note:  09/06/2021 9:44 AM  Aaron Carlson  has presented today for surgery, with the diagnosis of heart block.  The various methods of treatment have been discussed with the patient and family. After consideration of risks, benefits and other options for treatment, the patient has consented to  Procedure(s): TEMPORARY PACEMAKER (N/A) as a surgical intervention.  The patient's history has been reviewed, patient examined, no change in status, stable for surgery.  I have reviewed the patient's chart and labs.  Questions were answered to the patient's satisfaction.     [name]

## 2021-09-23 NOTE — Progress Notes (Signed)
This chaplain offered a spiritual care presence to the Pt. family after the Pt. Code Blue. The Pt. grand-daughter-Tia was at the bedside at the time of the Pt. Code.  Tia, Pt. daughter, and two additional granddaughters are gathered in the consult room. The Pt. preferred name is "Aaron Carlson".  The chaplain is appreciative of Dr. Tamala Julian, Dr. Ellyn Hack and [place] presence. The chaplain observed the family's emotion decline as questions were answered and next steps were discussed.  The chaplain understands additional family will gather. Nunzio Cory explained the visitor policy.  The family accepted coffee and prayer from the chaplain. The chaplain will F/U throughout the day.  Chaplain Sallyanne Kuster [phone]

## 2021-09-23 NOTE — Progress Notes (Signed)
Case discussed with Dr. Ellyn Hack.  Also updated family at length.  Will work toward a TVP and see how neurological status settles out over next couple days then consider more permanent solution.  Additional 45 min cc time.

## 2021-09-23 NOTE — Procedures (Signed)
Central Venous Catheter Insertion Procedure Note  Aaron Carlson  [id number]  08/22/1933  Date:09/19/2021  Time:9:50 AM   Provider Performing:Aaron Carlson   Procedure: Insertion of Non-tunneled Central Venous [phone]) with US guidance ([id number])   Indication(s) Medication administration and Difficult access  Consent Risks of the procedure as well as the alternatives and risks of each were explained to the patient and/or caregiver.  Consent for the procedure was obtained and is signed in the bedside chart  Anesthesia Topical only with 1% lidocaine   Timeout Verified patient identification, verified procedure, site/side was marked, verified correct patient position, special equipment/implants available, medications/allergies/relevant history reviewed, required imaging and test results available.  Sterile Technique Maximal sterile technique including full sterile barrier drape, hand hygiene, sterile gown, sterile gloves, mask, hair covering, sterile ultrasound probe cover (if used).  Procedure Description Area of catheter insertion was cleaned with chlorhexidine and draped in sterile fashion.  With real-time ultrasound guidance a central venous catheter was placed into the right femoral vein. Nonpulsatile blood flow and easy flushing noted in all ports.  The catheter was sutured in place and sterile dressing applied.  Complications/Tolerance None; patient tolerated the procedure well. Chest x-ray is not ordered for femoral cannulation.  EBL Minimal  Specimen(s) None     Aaron Carlson, ACNP  Pulmonary & Critical Care 08/28/2021, 9:51 AM  See Amion for pager If no response to pager, please call PCCM consult pager After 7:00 pm call Elink

## 2021-09-23 NOTE — Progress Notes (Signed)
Asystole noted on monitor, patient unresponsive, Code Blue called, see Code Blue Record.

## 2021-09-23 NOTE — Progress Notes (Addendum)
STROKE TEAM PROGRESS NOTE   INTERVAL HISTORY His granddaughter and preacher are at the bedside.  The MRI yesterday showed a stable hematoma.  Repeat CT scan this morning was also stable. Patient had an asystole cardiac arrest this morning around 0822am.  ROSC was achieved and the patient was taken to the Cath Lab for a temporary pacemaker.  He is intubated, but does seem to be moving his bilateral lower extremities and left upper extremity spontaneously.  Per nursing staff his neurological status had been unchanged from yesterday prior to the cardiac arrest.  Repeat CT scan ordered per primary team.   Vitals:   08/29/2021 1010 09/09/2021 1015 09/11/2021 1042 09/08/2021 1235  BP: (!) 146/60 (!) 134/59    Pulse: 76 74    Resp: 15 (!) 8 16   Temp:      TempSrc:      SpO2: (!) 0% (!) 0%  99%  Weight:      Height:       CBC:  Recent Labs  Lab 09/07/2021 1806 09/16/2021 1823 09/22/21 0133 09/07/2021 0036 09/14/2021 1039  WBC 14.6*   < > 13.2* 13.6*  --   NEUTROABS 8.2*  --   --   --   --   HGB 10.6*   < > 9.9* 9.6* 9.2*  HCT 32.4*   < > 29.6* 28.7* 27.0*  MCV 92.6   < > 91.1 91.4  --   PLT 198   < > 155 167  --    < > = values in this interval not displayed.    Basic Metabolic Panel:  Recent Labs  Lab 09/22/21 0133 09/21/2021 0036 09/05/2021 1039  NA 140 142 146*  K 3.5 3.1* 3.7  CL 117* 121*  --   CO2 15* 14*  --   GLUCOSE 198* 164*  --   BUN 40* 42*  --   CREATININE 1.97* 1.74*  --   CALCIUM 8.3* 8.6*  --   MG 1.9 2.4  --   PHOS 5.0* 3.2  --     Lipid Panel:  Recent Labs  Lab 09/21/21 0807  CHOL 147  TRIG 51  HDL 45  CHOLHDL 3.3  VLDL 10  LDLCALC 92    HgbA1c:  Recent Labs  Lab 09/25/2021 2244  HGBA1C 8.3*    Urine Drug Screen: No results for input(s): LABOPIA, COCAINSCRNUR, LABBENZ, AMPHETMU, THCU, LABBARB in the last 168 hours.  Alcohol Level No results for input(s): ETH in the last 168 hours.  IMAGING past 24 hours CT HEAD WO CONTRAST (5MM)  Result Date:  09/19/2021 CLINICAL DATA:  Hemorrhagic stroke follow-up EXAM: CT HEAD WITHOUT CONTRAST TECHNIQUE: Contiguous axial images were obtained from the base of the skull through the vertex without intravenous contrast. COMPARISON:  Yesterday FINDINGS: Brain: 29 x 40 mm hematoma the left frontal parietal junction with regional low-density swelling that is mildly progressed. Anteriorly is a low-density space that has filled in with blood. Still no midline shift of the atrophic brain. Small volume local subarachnoid hemorrhage. No hydrocephalus. Vascular: Atheromatous calcification. Skull: Negative for fracture.  Left parietal scalp swelling Sinuses/Orbits: Negative IMPRESSION: Mildly increased edema around the left cerebral hemorrhage which is not significantly increased in size. Electronically Signed   By: Jorje Guild M.D.   On: 09/05/2021 06:31   CARDIAC CATHETERIZATION  Result Date: 09/12/2021 Bradycardia with cardiac arrest. Pacing wire repositioned into the RV His current heart rate is 130 sinus tachycardia. Pacing set at backup of 40  bpm.   CARDIAC CATHETERIZATION  Result Date: 08/31/2021 Bradycardia, cardiac arrest 2.   Successful placement temporary transvenous pacing wire from the left femoral vein.   DG Chest Port 1 View  Result Date: 09/14/2021 CLINICAL DATA:  ET/NG tube placement EXAM: PORTABLE CHEST 1 VIEW COMPARISON:  Chest x-ray dated September 20, 2021 FINDINGS: ET tube is unchanged in position. OG tube tip is in the stomach with side port near the GE junction. Mild bilateral heterogeneous pulmonary opacities, similar to prior exam. No large pleural effusion or evidence of pneumothorax. Cardiac and mediastinal contours are unchanged. IMPRESSION: 1. OG tube tip is in the stomach with side port near the GE junction, consider advancement for optimal positioning. 2. Mild bilateral heterogeneous pulmonary opacities, likely a combination of atelectasis and pulmonary edema. Radiology ilia speaking  Electronically Signed   By: Yetta Glassman M.D.   On: 09/22/2021 09:07   DG Swallowing Func-Speech Pathology  Result Date: 09/22/2021 Table formatting from the original result was not included. Objective Swallowing Evaluation: Type of Study: MBS-Modified Barium Swallow Study  Patient Details Name: LOXLEY SCHMALE MRN: [id number] Date of Birth: [birth year] Today's Date: 09/22/2021 Time: SLP Start Time (ACUTE ONLY): 1458 -SLP Stop Time (ACUTE ONLY): 1516 SLP Time Calculation (min) (ACUTE ONLY): 18 min Past Medical History: Past Medical History: Diagnosis Date  CAD in native artery 08/28/2013  Heaviness in chest with exertion. Coronary angiography 2005 demonstrated severe distal LAD and diagonal disease, severe distal RCA and PL OM disease, and severe first obtuse marginal disease. No high-grade proximal coronary disease was present at the time.  The most recent myocardial perfusion study in 2014 was nonischemic/low risk    Chronic renal insufficiency   Colon polyps   adenomatous  Diabetes mellitus   Hyperlipemia   Hypertension   Laryngeal papillomatosis   Prostate cancer ([place])   w/ mets to bladder Past Surgical History: Past Surgical History: Procedure Laterality Date  BLADDER SURGERY    CATARACT EXTRACTION Left 02/2020  CIRCUMCISION  2011  POLYPECTOMY    vocal cords  PROSTATE SURGERY   HPI: Pt is an 87 yo male presenting with syncope and found unresponsive. Pt went into PEA arrest requiring CPR x3 minutes with ROSC. ETT 12/24-12/25. Pt initially passed a yale swallow screen post-extubation but then a code stroke was called in the early morning on 12/26 due to R sided weakness and slurred speech. CTH on admission negative for acute changes but repeat on 12/26 revealed acute L sided ICH. MRI with considerable motion degradation but redmonstrates intraparenchymal hematoma at the L frontoparietal junction with surrounding edema. PMH significant for recurrent laryngeal papillomatosis s/p laser excision, CAD, DM2, HTN,  HLD, Prostrate Cancer  Subjective: lethargic  Recommendations for follow up therapy are one component of a multi-disciplinary discharge planning process, led by the attending physician.  Recommendations may be updated based on patient status, additional functional criteria and insurance authorization. Assessment / Plan / Recommendation Clinical Impressions 09/22/2021 Clinical Impression Pt has a moderate oropharyngeal dysphagia due to sensory and motor deficits, perhaps further exacerbated during this study given pt lethargy. He has weak labial seal on the R that allows for anterior loss, as well as reduced lingual propulsion and reduced coordination for posterior transit. Oral residue remains, especially on his R side and in his anterior sulcus. His bolus cohesion is reduced and his timing is inconsistent for swallow trigger. Most consistencies initiate a swallow at the pyriform sinuses at least intermittently, although the thinner the consistency, the more consistently  it reaches the pyriform sinuses before the swallow. Thin and nectar thick liquids more consistently enter the airway before the swallow in small quantities, but aspiration is silent and his cued cough is too weak to clear his airway. Pt can contain honey thick liquids and purees in his valleculae at times, but deep penetration to the vocal folds occurs when his timing is off. There is also more residue in the valleculae and posterior pharyngeal wall due to reduced base of tongue retraction and pharyngeal squeeze. A chin tuck helps to improve airway protection and slightly reduce residue with purees. It helps with honey thick liquids as well, but he can't get boluses out of a straw and he can't coordinate a chin tuck with cup sips. A spoon had to be used. Recommend starting with Dys 1 (puree) diet and honey thick liquids by spoon with full supervision for use of a chin tuck. Anticipate that it would be hard for him to meet his nutritional needs with  such restrictions and with current lethargy, so may want to consider temporary alternative means of nutrition to supplement PO diet. Will f/u for ongoing dysphagia management. SLP Visit Diagnosis Dysphagia, oropharyngeal phase (R13.12) Attention and concentration deficit following -- Frontal lobe and executive function deficit following -- Impact on safety and function Moderate aspiration risk;Risk for inadequate nutrition/hydration   Treatment Recommendations 09/22/2021 Treatment Recommendations Therapy as outlined in treatment plan below   Prognosis 09/22/2021 Prognosis for Safe Diet Advancement Good Barriers to Reach Goals -- Barriers/Prognosis Comment -- Diet Recommendations 09/22/2021 SLP Diet Recommendations Dysphagia 1 (Puree) solids;Honey thick liquids Liquid Administration via Spoon Medication Administration Crushed with puree Compensations Minimize environmental distractions;Slow rate;Small sips/bites;Lingual sweep for clearance of pocketing;Monitor for anterior loss;Chin tuck Postural Changes Seated upright at 90 degrees;Remain semi-upright after after feeds/meals (Comment)   Other Recommendations 09/22/2021 Recommended Consults -- Oral Care Recommendations Oral care BID Other Recommendations Order thickener from pharmacy;Prohibited food (jello, ice cream, thin soups);Remove water pitcher;Have oral suction available Follow Up Recommendations Acute inpatient rehab (3hours/day) Assistance recommended at discharge Intermittent Supervision/Assistance Functional Status Assessment Patient has had a recent decline in their functional status and demonstrates the ability to make significant improvements in function in a reasonable and predictable amount of time. Frequency and Duration  09/22/2021 Speech Therapy Frequency (ACUTE ONLY) min 2x/week Treatment Duration 2 weeks   Oral Phase 09/22/2021 Oral Phase Impaired Oral - Pudding Teaspoon -- Oral - Pudding Cup -- Oral - Honey Teaspoon Decreased bolus  cohesion;Lingual/palatal residue;Reduced posterior propulsion;Right anterior bolus loss Oral - Honey Cup Decreased bolus cohesion;Lingual/palatal residue;Reduced posterior propulsion;Right anterior bolus loss Oral - Nectar Teaspoon -- Oral - Nectar Cup Decreased bolus cohesion;Lingual/palatal residue;Reduced posterior propulsion;Right anterior bolus loss Oral - Nectar Straw Decreased bolus cohesion;Lingual/palatal residue;Reduced posterior propulsion Oral - Thin Teaspoon -- Oral - Thin Cup Decreased bolus cohesion;Lingual/palatal residue;Reduced posterior propulsion;Right anterior bolus loss Oral - Thin Straw -- Oral - Puree Decreased bolus cohesion;Lingual/palatal residue;Reduced posterior propulsion;Right anterior bolus loss Oral - Mech Soft -- Oral - Regular -- Oral - Multi-Consistency -- Oral - Pill -- Oral Phase - Comment --  Pharyngeal Phase 09/22/2021 Pharyngeal Phase Impaired Pharyngeal- Pudding Teaspoon -- Pharyngeal -- Pharyngeal- Pudding Cup -- Pharyngeal -- Pharyngeal- Honey Teaspoon Reduced pharyngeal peristalsis;Reduced tongue base retraction;Pharyngeal residue - valleculae;Delayed swallow initiation-pyriform sinuses;Compensatory strategies attempted (with notebox) Pharyngeal -- Pharyngeal- Honey Cup Reduced pharyngeal peristalsis;Reduced tongue base retraction;Penetration/Apiration after swallow;Pharyngeal residue - valleculae;Delayed swallow initiation-pyriform sinuses Pharyngeal Material enters airway, CONTACTS cords and not ejected out Pharyngeal- Nectar Teaspoon --  Pharyngeal -- Pharyngeal- Nectar Cup Delayed swallow initiation-pyriform sinuses;Penetration/Aspiration before swallow;Reduced tongue base retraction;Pharyngeal residue - valleculae Pharyngeal Material enters airway, CONTACTS cords and not ejected out Pharyngeal- Nectar Straw Delayed swallow initiation-pyriform sinuses;Penetration/Aspiration before swallow;Reduced tongue base retraction;Pharyngeal residue - valleculae Pharyngeal  Material enters airway, passes BELOW cords without attempt by patient to eject out (silent aspiration) Pharyngeal- Thin Teaspoon -- Pharyngeal -- Pharyngeal- Thin Cup Delayed swallow initiation-pyriform sinuses;Penetration/Aspiration before swallow;Reduced tongue base retraction Pharyngeal Material enters airway, passes BELOW cords without attempt by patient to eject out (silent aspiration) Pharyngeal- Thin Straw -- Pharyngeal -- Pharyngeal- Puree Reduced pharyngeal peristalsis;Reduced tongue base retraction;Penetration/Apiration after swallow;Pharyngeal residue - valleculae;Delayed swallow initiation-pyriform sinuses;Penetration/Aspiration during swallow;Reduced airway/laryngeal closure Pharyngeal Material enters airway, CONTACTS cords and not ejected out Pharyngeal- Mechanical Soft -- Pharyngeal -- Pharyngeal- Regular -- Pharyngeal -- Pharyngeal- Multi-consistency -- Pharyngeal -- Pharyngeal- Pill -- Pharyngeal -- Pharyngeal Comment --  Cervical Esophageal Phase  09/22/2021 Cervical Esophageal Phase WFL Pudding Teaspoon -- Pudding Cup -- Honey Teaspoon -- Honey Cup -- Nectar Teaspoon -- Nectar Cup -- Nectar Straw -- Thin Teaspoon -- Thin Cup -- Thin Straw -- Puree -- Mechanical Soft -- Regular -- Multi-consistency -- Pill -- Cervical Esophageal Comment -- Osie Bond., M.A. CCC-SLP Acute Rehabilitation Services Pager [phone] Office [phone] 09/22/2021, 4:29 PM                      PHYSICAL EXAM  Temp:  [97.4 F (36.3 C)-98.2 F (36.8 C)] 97.4 F (36.3 C) (12/27 0700) Pulse Rate:  [53-119] 74 (12/27 1015) Resp:  [8-52] 16 (12/27 1042) BP: (106-149)/(50-120) 134/59 (12/27 1015) SpO2:  [0 %-100 %] 99 % (12/27 1235) FiO2 (%):  [100 %] 100 % (12/27 1235) Weight:  [90.6 kg] 90.6 kg (12/27 0600)  General - Intubated, elderly male Ophthalmologic - fundi not visualized due to noncooperation. Cardiovascular - tachycardic  Mental Status -  Intubated, sedation given in cath lab.   Cranial Nerves  II - XII - II - Visual field intact OU. III, IV, VI - left gaze preference, however he is able to cross midline. V - sensation is absent on the right face VII - right facial droop VIII - Hearing & vestibular intact bilaterally. X - Palate elevates symmetrically. XI - Chin turning & shoulder shrug intact bilaterally. XII - Tongue protrusion to the left  Motor Strength -  Spontaneous movement in BLE and LUE. No movement noted in RUE Motor Tone - Muscle tone was assessed at the neck and appendages and was normal. Reflexes - deferred Sensory - does not withdraw to pain in RUE Coordination - deferred Gait and Station - deferred.   ASSESSMENT/PLAN Mr. Aaron Carlson is a 87 y.o. male with history of CAD, combined systolic and diastolic heart failure, HTN, HLD, DM, CKD 3/4, and GERD presenting initially with syncope and cardiac arrest. He fell, hit head, and found to have bradycardia HR 30s with heart block.  Initial CT on 12/24 left scalp hematoma but no intracranial hemorrhage.  Patient then developed PEA arrest, had CPR for 3 minutes and ROSC.  A heparin gtt was initiated on 12/24 per ACS protocol. He was extubated on 12/25. He developed a facial droop, slurred speech, and RUE weakness on 12/26 and a code stroke was activated. Head CT showed a L MCA territory 10ml ICH with a small volume extension of subarachnoid blood. MRI shows stable hematoma in the left frontoparietal junction. Code blue was called for on 12/27 for a cardiac  arrest.. Patient went asystolic, ROSC achieved and patient was taken to cath lab for a temporary pacemaker. He was reintubated and had an ART line placed.   ICH - Left frontal ICH with SAH likely secondary to head trauma in combination with heparin IV Code Stroke- Acute L side 17ml intra-axial and perirolandic hemorrhage, small volume extension of subarachnoid blood. No midline shift MRI  intraparenchymal hematoma at the left frontoparietal junction measuring 3x4.2cm CT  repeat x 2 - stable hematoma size 2D Echo EF 50-55% LV low normal function. Grade I diastolic dysfunction.  CUS unremarkable LDL 92 HgbA1c 8.3 VTE prophylaxis - SCDs No antithrombotic prior to admission, now on No antithrombotic due to [place]. Therapy recommendations:  pending Disposition:  pending  Syncope with bradycardia PEA Arrest Asystole cardia arrest Heparin infusion for ACS protocol.  Trop [phone] Reversed with protamine on 12/26 at 0439 in setting of ICH Currently no antithrombotics due to [place] and [facility] Asystolic cardiac arrest with intermittent VT- 12/27 Temporary pacemaker placed 12/27 Intubated, on epinephrine, Norepinephrine  Hypertension Home meds:  Metoprolol succinate 25mg , Lasix 80mg  Stable BP goal less than 160 Long-term BP goal normotensive PRN hydralazine 10mg  q4  Hyperlipidemia Home meds:  Atorvastatin 20mg  LDL 92, goal < 70 On lipitor 40 Continue statin on discharge Not SATURN candidate due to likely traumatic ICH  Diabetes type II Uncontrolled Home meds:  Humalog HgbA1c 8.3, goal < 7.0 CBGs SSI Close PCP follow up for better DM control.   Other Stroke Risk Factors Advanced Age >/= 75  Obesity, Body mass index is 30.37 kg/m., BMI >/= 30 associated with increased stroke risk, recommend weight loss, diet and exercise as appropriate  Coronary artery disease - Home Meds: Imdur 60mg  q24hr, Nitro PRN  Other Active Problems GERD Prostate cancer AKI - Cre 2.20->1.97->1.74 Leukocytosis WBC 12.7->13.2->13.6  Hospital day # 3  Patient seen and examined by NP/APP with MD. MD to update note as needed.   Janine Ores, DNP, FNP-BC [facility] Pager: [phone]  ATTENDING NOTE: I reviewed above note and agree with the assessment and plan. Pt was seen and examined.   Patient had a systole current arrest earlier this morning, status post CPR 5-10 min and ROSC.  Intubated on pressor and had temporary pacemaker placed.  Repeat CT x2  stable hematoma.  Follow-up antithrombotic due to [place].  May consider antiplatelet once ICH absorbed or nearly absorbed on CT.  Continue Lipitor.  For detailed assessment and plan, please refer to above as I have made changes wherever appropriate.   Rosalin Hawking, MD PhD Stroke Neurology 09/05/2021 6:15 PM  This patient is critically ill due to cardiac arrest, ICH, SAH, AKI, respiratory failure and at significant risk of neurological worsening, death form hematoma expansion, cardiac test, renal failure, cerebral edema, seizure. This patient's care requires constant monitoring of vital signs, hemodynamics, respiratory and cardiac monitoring, review of multiple databases, neurological assessment, discussion with family, other specialists and medical decision making of high complexity. I spent 35 minutes of neurocritical care time in the care of this patient.    To contact Stroke Continuity provider, please refer to [email or web address] After hours, contact General Neurology

## 2021-09-23 NOTE — Progress Notes (Signed)
SLP Cancellation Note  Patient Details Name: Aaron Carlson MRN: [id number] DOB: 08-25-1933   Cancelled treatment:       Reason Eval/Treat Not Completed: Medical issues which prohibited therapy - pt with code blue called this morning due to PEA arrest, now intubated. SLP orders have been d/c at this time. Note that pt does have an acute, neurologically based dysphagia and dysarthria and would benefit from re-ordering SLP once medically appropriate, especially before resuming any POs.    Osie Bond., M.A. [facility] Acute Rehabilitation Services Pager [phone] Office [phone]  09/07/2021, 8:49 AM

## 2021-09-23 NOTE — Procedures (Signed)
Cardiopulmonary Resuscitation Note  Aaron Carlson  [id number]  [birth year]  Date:09/15/2021  Time:8:42 AM   Provider Performing:[name]   Procedure: Cardiopulmonary Resuscitation [phone])  Indication(s) Loss of Pulse  Consent N/A  Anesthesia N/A   Time Out N/A   Sterile Technique Hand hygiene, gloves   Procedure Description Called to patient's room for CODE BLUE. Initial rhythm was PEA/Asystole. Patient received high quality chest compressions for 10 minutes with defibrillation or cardioversion when appropriate. Epinephrine was administered every 3 minutes as directed by time Therapist, nutritional. Additional pharmacologic interventions included atropine. Return of spontaneous circulation was achieved.  Family at bedside.   Complications/Tolerance N/A   EBL N/A   Specimen(s) N/A  Estimated time to ROSC: 70minutes

## 2021-09-23 NOTE — H&P (View-Only) (Signed)
Progress Note  Patient Name: Aaron Carlson Date of Encounter: 09/22/2021  [facility] Cardiologist: Sinclair Grooms, MD   Subjective  \ This morning, upon entering the ICU, CODE BLUE was being called in the patient's room for presumed PEA arrest.  On review of EKG appears to be asystole followed by VT.  Chest compressions being performed.  Patient intubated plans for line placement.  Inpatient Medications    Scheduled Meds:   stroke: mapping our early stages of recovery book   Does not apply Once   acetaminophen  650 mg Oral Q4H   Or   acetaminophen (TYLENOL) oral liquid 160 mg/5 mL  650 mg Per Tube Q4H   Or   acetaminophen  650 mg Rectal Q4H   atorvastatin  40 mg Oral Daily   chlorhexidine gluconate (MEDLINE KIT)  15 mL Mouth Rinse BID   Chlorhexidine Gluconate Cloth  6 each Topical Daily   docusate  100 mg Per Tube BID   fentaNYL (SUBLIMAZE) injection  25 mcg Intravenous Once   fentaNYL (SUBLIMAZE) injection  25 mcg Intravenous Once   insulin aspart  0-15 Units Subcutaneous Q4H   lidocaine  1 patch Transdermal Q24H   mouth rinse  15 mL Mouth Rinse 10 times per day   pantoprazole sodium  40 mg Per Tube QHS   polyethylene glycol  17 g Per Tube Daily   potassium chloride  20 mEq Oral Once   senna-docusate  1 tablet Oral BID   Continuous Infusions:  sodium chloride Stopped (09/21/21 1539)   sodium chloride 100 mL/hr at 09/07/2021 0700   epinephrine     fentaNYL infusion INTRAVENOUS Stopped (09/21/21 1120)   promethazine (PHENERGAN) injection (IM or IVPB)     PRN Meds: acetaminophen **OR** acetaminophen (TYLENOL) oral liquid 160 mg/5 mL **OR** acetaminophen, etomidate, fentaNYL, gadobutrol, guaiFENesin, hydrALAZINE, midazolam, midazolam, ondansetron (ZOFRAN) IV   Vital Signs    Vitals:   09/11/2021 0600 09/05/2021 0630 09/12/2021 0700 09/15/2021 0842  BP: 129/70 128/61 119/65   Pulse: 98 97 (!) 105   Resp: (!) 33 (!) 27 (!) 37   Temp:   (!) 97.4 F (36.3 C)    TempSrc:   Axillary   SpO2: 98% 96% 95% 98%  Weight: 90.6 kg     Height:        Intake/Output Summary (Last 24 hours) at 09/15/2021 0845 Last data filed at 09/02/2021 0700 Gross per 24 hour  Intake 2367.37 ml  Output 1300 ml  Net 1067.37 ml   Last 3 Weights 09/08/2021 09/22/2021 06/19/2021  Weight (lbs) 199 lb 11.8 oz 202 lb 9.6 oz 206 lb  Weight (kg) 90.6 kg 91.9 kg 93.441 kg      Telemetry   This morning he had short sinus pause/complete block with several seconds essentially asystole followed by a prolonged asystole over 10 seconds.  Is also had intermittent V. tach. Currently transcutaneously paced at rate 70 bpm.   Personally Reviewed  ECG    N/A this morning upon  Physical Exam   GEN: Being evaluated during CPR.  Intubated Neck: No JVD Cardiac: Now RRR.  1/6 SEM Respiratory: Diminished breath sounds, upper respiratory. GI: Soft/NT/ND NABS. MS: No C/C/C Neuro: Noted right facial droop, now intubated and sedated.   Labs    High Sensitivity Troponin:   Recent Labs  Lab 08/30/2021 1806 08/29/2021 2244 09/21/21 0807  TROPONINIHS 25* 481* 7,052*     Chemistry Recent Labs  Lab 09/09/2021 1806 09/22/2021 1823  09/21/21 0807 09/22/21 0133 09/03/2021 0036  NA 137   < > 140 140 142  K 3.6   < > 3.8 3.5 3.1*  CL 110   < > 116* 117* 121*  CO2 19*  --  17* 15* 14*  GLUCOSE 331*   < > 147* 198* 164*  BUN 38*   < > 37* 40* 42*  CREATININE 2.18*   < > 1.94* 1.97* 1.74*  CALCIUM 8.4*  --  8.4* 8.3* 8.6*  MG  --   --  2.0 1.9 2.4  PROT 6.2*  --   --   --   --   ALBUMIN 3.4*  --   --   --   --   AST 35  --   --   --   --   ALT 36  --   --   --   --   ALKPHOS 90  --   --   --   --   BILITOT 0.7  --   --   --   --   GFRNONAA 29*  --  33* 32* 38*  ANIONGAP 8  --  _0 < > = values in this interval not displayed.    Lipids  Recent Labs  Lab 09/21/21 0807  CHOL 147  TRIG 51  HDL 45  LDLCALC 92  CHOLHDL 3.3    Hematology Recent Labs  Lab 09/21/21 0807  09/22/21 0133 09/21/2021 0036  WBC 12.7* 13.2* 13.6*  RBC 3.13* 3.25* 3.14*  HGB 9.4* 9.9* 9.6*  HCT 28.1* 29.6* 28.7*  MCV 89.8 91.1 91.4  MCH 30.0 30.5 30.6  MCHC 33.5 33.4 33.4  RDW 14.0 14.4 14.7  PLT 151 155 167   Thyroid  Recent Labs  Lab 09/17/2021 2244  TSH 3.723  FREET4 0.78    BNPNo results for input(s): BNP, PROBNP in the last 168 hours.  DDimer No results for input(s): DDIMER in the last 168 hours.   Radiology    CT HEAD WO CONTRAST (5MM)  Result Date: 09/03/2021 CLINICAL DATA:  Hemorrhagic stroke follow-up EXAM: CT HEAD WITHOUT CONTRAST TECHNIQUE: Contiguous axial images were obtained from the base of the skull through the vertex without intravenous contrast. COMPARISON:  Yesterday FINDINGS: Brain: 29 x 40 mm hematoma the left frontal parietal junction with regional low-density swelling that is mildly progressed. Anteriorly is a low-density space that has filled in with blood. Still no midline shift of the atrophic brain. Small volume local subarachnoid hemorrhage. No hydrocephalus. Vascular: Atheromatous calcification. Skull: Negative for fracture.  Left parietal scalp swelling Sinuses/Orbits: Negative IMPRESSION: Mildly increased edema around the left cerebral hemorrhage which is not significantly increased in size. Electronically Signed   By: Jorje Guild M.D.   On: 09/27/2021 06:31   MR BRAIN W WO CONTRAST  Result Date: 09/22/2021 CLINICAL DATA:  Stroke. Follow-up intracranial hemorrhage. Assess for underlying lesion. EXAM: MRI HEAD WITHOUT AND WITH CONTRAST TECHNIQUE: Multiplanar, multiecho pulse sequences of the brain and surrounding structures were obtained without and with intravenous contrast. CONTRAST:  45m GADAVIST GADOBUTROL 1 MMOL/ML IV SOLN COMPARISON:  09/07/2021.  CT earlier same day. FINDINGS: Brain: The study suffers from considerable motion degradation. No focal abnormality is seen affecting the brainstem or cerebellum. Right cerebral hemisphere shows  mild chronic small-vessel ischemic changes of the white matter. Left cerebral hemisphere shows a 4.2 x 3 cm in diameter hematoma at the left frontoparietal junction with surrounding edema. After contrast administration, there  are a few punctate foci of contrast enhancement or accumulation along the posterior wall. The possibility of ongoing bleeding does exist, and it may be prudent to follow this with an additional CT in a few hours to make sure the hematoma is not enlarging further. There is no clear underlying mass lesion, but that is not completely excluded and should be evaluated subsequently. There is no midline shift. No hydrocephalus. No extra-axial collection. Vascular: Major vessels at the base of the brain show flow. Skull and upper cervical spine: Negative Sinuses/Orbits: Clear/normal Other: None IMPRESSION: Considerable motion degradation. Redemonstration of an intraparenchymal hematoma at the left frontoparietal junction measuring approximately 3 x 4.2 cm as seen previously. Surrounding edema. Question of punctate enhancement or contrast accumulation along the posterior wall. This could represent slow ongoing bleeding, and I would suggest repeat CT evaluation in several hours to make sure that the hematoma is not enlarging further. At this point, we have not diagnose an underlying mass lesion, but the patient should be restudied after recovery from the acute phase. Electronically Signed   By: Nelson Chimes M.D.   On: 09/22/2021 11:57   DG Swallowing Func-Speech Pathology  Result Date: 09/22/2021 Table formatting from the original result was not included. Objective Swallowing Evaluation: Type of Study: MBS-Modified Barium Swallow Study  Patient Details Name: Aaron Carlson MRN: [id number] Date of Birth: 03/23/1933 Today's Date: 09/22/2021 Time: SLP Start Time (ACUTE ONLY): 1458 -SLP Stop Time (ACUTE ONLY): 1516 SLP Time Calculation (min) (ACUTE ONLY): 18 min Past Medical History: Past Medical  History: Diagnosis Date  CAD in native artery 08/28/2013  Heaviness in chest with exertion. Coronary angiography 2005 demonstrated severe distal LAD and diagonal disease, severe distal RCA and PL OM disease, and severe first obtuse marginal disease. No high-grade proximal coronary disease was present at the time.  The most recent myocardial perfusion study in 2014 was nonischemic/low risk    Chronic renal insufficiency   Colon polyps   adenomatous  Diabetes mellitus   Hyperlipemia   Hypertension   Laryngeal papillomatosis   Prostate cancer ([place])   w/ mets to bladder Past Surgical History: Past Surgical History: Procedure Laterality Date  BLADDER SURGERY    CATARACT EXTRACTION Left 02/2020  CIRCUMCISION  2011  POLYPECTOMY    vocal cords  PROSTATE SURGERY   HPI: Pt is an 87 yo male presenting with syncope and found unresponsive. Pt went into PEA arrest requiring CPR x3 minutes with ROSC. ETT 12/24-12/25. Pt initially passed a yale swallow screen post-extubation but then a code stroke was called in the early morning on 12/26 due to R sided weakness and slurred speech. CTH on admission negative for acute changes but repeat on 12/26 revealed acute L sided ICH. MRI with considerable motion degradation but redmonstrates intraparenchymal hematoma at the L frontoparietal junction with surrounding edema. PMH significant for recurrent laryngeal papillomatosis s/p laser excision, CAD, DM2, HTN, HLD, Prostrate Cancer  Subjective: lethargic  Recommendations for follow up therapy are one component of a multi-disciplinary discharge planning process, led by the attending physician.  Recommendations may be updated based on patient status, additional functional criteria and insurance authorization. Assessment / Plan / Recommendation Clinical Impressions 09/22/2021 Clinical Impression Pt has a moderate oropharyngeal dysphagia due to sensory and motor deficits, perhaps further exacerbated during this study given pt lethargy. He has weak  labial seal on the R that allows for anterior loss, as well as reduced lingual propulsion and reduced coordination for posterior transit. Oral residue remains, especially  on his R side and in his anterior sulcus. His bolus cohesion is reduced and his timing is inconsistent for swallow trigger. Most consistencies initiate a swallow at the pyriform sinuses at least intermittently, although the thinner the consistency, the more consistently it reaches the pyriform sinuses before the swallow. Thin and nectar thick liquids more consistently enter the airway before the swallow in small quantities, but aspiration is silent and his cued cough is too weak to clear his airway. Pt can contain honey thick liquids and purees in his valleculae at times, but deep penetration to the vocal folds occurs when his timing is off. There is also more residue in the valleculae and posterior pharyngeal wall due to reduced base of tongue retraction and pharyngeal squeeze. A chin tuck helps to improve airway protection and slightly reduce residue with purees. It helps with honey thick liquids as well, but he can't get boluses out of a straw and he can't coordinate a chin tuck with cup sips. A spoon had to be used. Recommend starting with Dys 1 (puree) diet and honey thick liquids by spoon with full supervision for use of a chin tuck. Anticipate that it would be hard for him to meet his nutritional needs with such restrictions and with current lethargy, so may want to consider temporary alternative means of nutrition to supplement PO diet. Will f/u for ongoing dysphagia management. SLP Visit Diagnosis Dysphagia, oropharyngeal phase (R13.12) Attention and concentration deficit following -- Frontal lobe and executive function deficit following -- Impact on safety and function Moderate aspiration risk;Risk for inadequate nutrition/hydration   Treatment Recommendations 09/22/2021 Treatment Recommendations Therapy as outlined in treatment plan  below   Prognosis 09/22/2021 Prognosis for Safe Diet Advancement Good Barriers to Reach Goals -- Barriers/Prognosis Comment -- Diet Recommendations 09/22/2021 SLP Diet Recommendations Dysphagia 1 (Puree) solids;Honey thick liquids Liquid Administration via Spoon Medication Administration Crushed with puree Compensations Minimize environmental distractions;Slow rate;Small sips/bites;Lingual sweep for clearance of pocketing;Monitor for anterior loss;Chin tuck Postural Changes Seated upright at 90 degrees;Remain semi-upright after after feeds/meals (Comment)   Other Recommendations 09/22/2021 Recommended Consults -- Oral Care Recommendations Oral care BID Other Recommendations Order thickener from pharmacy;Prohibited food (jello, ice cream, thin soups);Remove water pitcher;Have oral suction available Follow Up Recommendations Acute inpatient rehab (3hours/day) Assistance recommended at discharge Intermittent Supervision/Assistance Functional Status Assessment Patient has had a recent decline in their functional status and demonstrates the ability to make significant improvements in function in a reasonable and predictable amount of time. Frequency and Duration  09/22/2021 Speech Therapy Frequency (ACUTE ONLY) min 2x/week Treatment Duration 2 weeks   Oral Phase 09/22/2021 Oral Phase Impaired Oral - Pudding Teaspoon -- Oral - Pudding Cup -- Oral - Honey Teaspoon Decreased bolus cohesion;Lingual/palatal residue;Reduced posterior propulsion;Right anterior bolus loss Oral - Honey Cup Decreased bolus cohesion;Lingual/palatal residue;Reduced posterior propulsion;Right anterior bolus loss Oral - Nectar Teaspoon -- Oral - Nectar Cup Decreased bolus cohesion;Lingual/palatal residue;Reduced posterior propulsion;Right anterior bolus loss Oral - Nectar Straw Decreased bolus cohesion;Lingual/palatal residue;Reduced posterior propulsion Oral - Thin Teaspoon -- Oral - Thin Cup Decreased bolus cohesion;Lingual/palatal residue;Reduced  posterior propulsion;Right anterior bolus loss Oral - Thin Straw -- Oral - Puree Decreased bolus cohesion;Lingual/palatal residue;Reduced posterior propulsion;Right anterior bolus loss Oral - Mech Soft -- Oral - Regular -- Oral - Multi-Consistency -- Oral - Pill -- Oral Phase - Comment --  Pharyngeal Phase 09/22/2021 Pharyngeal Phase Impaired Pharyngeal- Pudding Teaspoon -- Pharyngeal -- Pharyngeal- Pudding Cup -- Pharyngeal -- Pharyngeal- Honey Teaspoon Reduced pharyngeal peristalsis;Reduced tongue base retraction;Pharyngeal  residue - valleculae;Delayed swallow initiation-pyriform sinuses;Compensatory strategies attempted (with notebox) Pharyngeal -- Pharyngeal- Honey Cup Reduced pharyngeal peristalsis;Reduced tongue base retraction;Penetration/Apiration after swallow;Pharyngeal residue - valleculae;Delayed swallow initiation-pyriform sinuses Pharyngeal Material enters airway, CONTACTS cords and not ejected out Pharyngeal- Nectar Teaspoon -- Pharyngeal -- Pharyngeal- Nectar Cup Delayed swallow initiation-pyriform sinuses;Penetration/Aspiration before swallow;Reduced tongue base retraction;Pharyngeal residue - valleculae Pharyngeal Material enters airway, CONTACTS cords and not ejected out Pharyngeal- Nectar Straw Delayed swallow initiation-pyriform sinuses;Penetration/Aspiration before swallow;Reduced tongue base retraction;Pharyngeal residue - valleculae Pharyngeal Material enters airway, passes BELOW cords without attempt by patient to eject out (silent aspiration) Pharyngeal- Thin Teaspoon -- Pharyngeal -- Pharyngeal- Thin Cup Delayed swallow initiation-pyriform sinuses;Penetration/Aspiration before swallow;Reduced tongue base retraction Pharyngeal Material enters airway, passes BELOW cords without attempt by patient to eject out (silent aspiration) Pharyngeal- Thin Straw -- Pharyngeal -- Pharyngeal- Puree Reduced pharyngeal peristalsis;Reduced tongue base retraction;Penetration/Apiration after  swallow;Pharyngeal residue - valleculae;Delayed swallow initiation-pyriform sinuses;Penetration/Aspiration during swallow;Reduced airway/laryngeal closure Pharyngeal Material enters airway, CONTACTS cords and not ejected out Pharyngeal- Mechanical Soft -- Pharyngeal -- Pharyngeal- Regular -- Pharyngeal -- Pharyngeal- Multi-consistency -- Pharyngeal -- Pharyngeal- Pill -- Pharyngeal -- Pharyngeal Comment --  Cervical Esophageal Phase  09/22/2021 Cervical Esophageal Phase WFL Pudding Teaspoon -- Pudding Cup -- Honey Teaspoon -- Honey Cup -- Nectar Teaspoon -- Nectar Cup -- Nectar Straw -- Thin Teaspoon -- Thin Cup -- Thin Straw -- Puree -- Mechanical Soft -- Regular -- Multi-consistency -- Pill -- Cervical Esophageal Comment -- Osie Bond., M.A. [place] Pager [phone] Office [phone] 09/22/2021, 4:29 PM                     ECHOCARDIOGRAM COMPLETE  Result Date: 09/21/2021    ECHOCARDIOGRAM REPORT   Patient Name:   Aaron Carlson Date of Exam: 09/21/2021 Medical Rec #:  [id number]       Height:       68.0 in Accession #:    [id number]      Weight:       202.6 lb Date of Birth:  05-07-1933       BSA:          2.055 m Patient Age:    20 years        BP:           105/53 mmHg Patient Gender: M               HR:           73 bpm. Exam Location:  Inpatient Procedure: 2D Echo Indications:     cardiac arrest  History:         Patient has prior history of Echocardiogram examinations, most                  recent 12/06/2020. CAD; Risk Factors:Diabetes and Hypertension.  Sonographer:     Johny Chess RDCS Referring Phys:  [id number] Estill Cotta Diagnosing Phys: Glori Bickers MD IMPRESSIONS  1. Left ventricular ejection fraction, by estimation, is 50 to 55%. The left ventricle has low normal function. The left ventricle has no regional wall motion abnormalities. Left ventricular diastolic parameters are consistent with Grade I diastolic dysfunction (impaired relaxation). There is  hypokinesis of the left ventricular, basal-mid inferior wall and inferolateral wall.  2. Right ventricular systolic function is normal. The right ventricular size is normal. There is normal pulmonary artery systolic pressure.  3. Left atrial size was mildly dilated.  4. The mitral valve is normal in structure.  No evidence of mitral valve regurgitation. No evidence of mitral stenosis.  5. The aortic valve has an indeterminant number of cusps. There is moderate calcification of the aortic valve. Aortic valve regurgitation is not visualized. Aortic valve sclerosis/calcification is present, without any evidence of aortic stenosis.  6. The inferior vena cava is normal in size with greater than 50% respiratory variability, suggesting right atrial pressure of 3 mmHg. FINDINGS  Left Ventricle: Left ventricular ejection fraction, by estimation, is 50 to 55%. The left ventricle has low normal function. The left ventricle has no regional wall motion abnormalities. The left ventricular internal cavity size was normal in size. There is no left ventricular hypertrophy. Left ventricular diastolic parameters are consistent with Grade I diastolic dysfunction (impaired relaxation). Right Ventricle: The right ventricular size is normal. No increase in right ventricular wall thickness. Right ventricular systolic function is normal. There is normal pulmonary artery systolic pressure. The tricuspid regurgitant velocity is 2.08 m/s, and  with an assumed right atrial pressure of 3 mmHg, the estimated right ventricular systolic pressure is 54.2 mmHg. Left Atrium: Left atrial size was mildly dilated. Right Atrium: Right atrial size was normal in size. Pericardium: There is no evidence of pericardial effusion. Mitral Valve: The mitral valve is normal in structure. No evidence of mitral valve regurgitation. No evidence of mitral valve stenosis. Tricuspid Valve: The tricuspid valve is normal in structure. Tricuspid valve regurgitation is not  demonstrated. No evidence of tricuspid stenosis. Aortic Valve: The aortic valve has an indeterminant number of cusps. There is moderate calcification of the aortic valve. Aortic valve regurgitation is not visualized. Aortic valve sclerosis/calcification is present, without any evidence of aortic stenosis. Pulmonic Valve: The pulmonic valve was normal in structure. Pulmonic valve regurgitation is not visualized. No evidence of pulmonic stenosis. Aorta: The aortic root is normal in size and structure. Venous: The inferior vena cava is normal in size with greater than 50% respiratory variability, suggesting right atrial pressure of 3 mmHg. IAS/Shunts: No atrial level shunt detected by color flow Doppler.  LEFT VENTRICLE PLAX 2D LVIDd:         3.80 cm   Diastology LVIDs:         2.30 cm   LV e' medial:    4.68 cm/s LV PW:         1.00 cm   LV E/e' medial:  12.0 LV IVS:        1.10 cm   LV e' lateral:   5.66 cm/s LVOT diam:     1.90 cm   LV E/e' lateral: 9.9 LV SV:         43 LV SV Index:   21 LVOT Area:     2.84 cm  RIGHT VENTRICLE            IVC RV S prime:     6.20 cm/s  IVC diam: 1.50 cm TAPSE (M-mode): 1.5 cm LEFT ATRIUM             Index        RIGHT ATRIUM          Index LA diam:        2.50 cm 1.22 cm/m   RA Area:     9.71 cm LA Vol (A2C):   44.2 ml 21.51 ml/m  RA Volume:   21.20 ml 10.32 ml/m LA Vol (A4C):   19.7 ml 9.59 ml/m LA Biplane Vol: 32.0 ml 15.57 ml/m  AORTIC VALVE LVOT Vmax:   85.90 cm/s  LVOT Vmean:  53.400 cm/s LVOT VTI:    0.152 m  AORTA Ao Root diam: 2.90 cm Ao Asc diam:  3.10 cm MITRAL VALVE               TRICUSPID VALVE MV Area (PHT): 3.12 cm    TR Peak grad:   17.3 mmHg MV Decel Time: 243 msec    TR Vmax:        208.00 cm/s MV E velocity: 56.20 cm/s MV A velocity: 66.30 cm/s  SHUNTS MV E/A ratio:  0.85        Systemic VTI:  0.15 m                            Systemic Diam: 1.90 cm Glori Bickers MD Electronically signed by Glori Bickers MD Signature Date/Time: 09/21/2021/3:01:56 PM     Final (Updated)    CT HEAD CODE STROKE WO CONTRAST  Result Date: 09/22/2021 CLINICAL DATA:  Code stroke.  87 year old male. EXAM: CT HEAD WITHOUT CONTRAST TECHNIQUE: Contiguous axial images were obtained from the base of the skull through the vertex without intravenous contrast. COMPARISON:  Head CT 09/07/2021. FINDINGS: Brain: Rounded intra-axial mixed density hemorrhage encompasses 42 by 30 by 36 mm (AP by transverse by CC) and there is a small volume of extra-axial extension of blood into the regional subarachnoid space. This is centered in the left perirolandic region, posterior left frontal and anterior parietal lobes just above the posterior operculum. There is surrounding edema. Mild regional mass effect. But no midline shift. No intraventricular extension of blood identified. No ventriculomegaly. Basilar cisterns remain patent and normal. No superimposed evidence of cortically based acute infarction. Gray-white matter differentiation outside of the area of acute hemorrhage is stable from 2 days ago. Vascular: Calcified atherosclerosis at the skull base. No suspicious intracranial vascular hyperdensity. Skull: No skull fracture identified. Sinuses/Orbits: Visualized paranasal sinuses and mastoids are stable and well aerated. Other: Broad-based left posterior convexity scalp hematoma. No scalp soft tissue gas. Underlying calvarium appears stable and intact. Orbits appear stable, negative. ASPECTS [facility] Stroke Program Early CT Score) - Ganglionic level infarction (caudate, lentiform nuclei, internal capsule, insula, M1-M3 cortex): - Supraganglionic infarction (M4-M6 cortex): Total score (0-10 with 10 being normal): IMPRESSION: 1. Acute left side 23 mL intra-axial and perirolandic hemorrhage with small volume extension of subarachnoid blood. Regional edema, but no midline shift. No IVH or ventriculomegaly. 2. Left posterior convexity scalp hematoma without underlying skull fracture. 3. No other acute  intracranial abnormality. 4. These results were communicated to Dr. Lorrin Goodell at 4:36 am on 09/22/2021 by text page via the [facility] messaging system. Electronically Signed   By: Genevie Ann M.D.   On: 09/22/2021 04:37    Cardiac Studies   TTE 09/21/2021: EF 50 to 55%.  Low normal function.  Basal-mid inferior and inferolateral hypokinesis.  GR 1 DD.  Normal RV size and function normal PAP.  Mild LA dilation.  Moderate aortic valve calcification/sclerosis but no stenosis.  Normal RAP.   Patient Profile     87 y.o. male presented to the hospital with an episode of syncope.  Found to have an elevated troponin.  Metoprolol was held and conduction has recovered without further episodes of bradycardia.  Had an intracranial hemorrhage overnight.  Assessment & Plan    1.  Transient 3rd AV Block over weekend - underlying RBBB/LAFB. ->  Now with prolonged sinus pause/asystole/complete heart block Plan was to hold beta-blocker with  plans to try to avoid a pacemaker. Currently being transcutaneously paced following cardiac arrest with CPR and reintubation. This is complicated by his intracranial hemorrhage.   May consider temporary pacemaker to stabilize to allow for more time for neurologic recovery -> we will monitor for signs stabilization postarrest.  Need to determine goals of care.= will re-engage EP to follow   2.  Known CAD with troponin elevation to 7052; unsure if this is a clinically ACS versus troponin elevation from neurologic injury.   Initially on heparin, discontinued due to [place].  Thankfully, EF is stable but does have regional wall motion normality.  No plans for invasive evaluation at this time.  3.  Chronic diastolic heart failure: Seems euvolemic.  Monitor I's and O's..   4.  Hyperlipidemia: Continue atorvastatin.  5.  Intracranial hemorrhage:  Followed by neurology.   Pending MRI once stabilized.   Precludes ability to perform invasive cardiac evaluation i.e. cardiac catheterization  and PCI.   For questions or updates, please contact [place] Please consult [email or web address] for contact info under    20-minute family talk in addition to 45 minutes with patient and chart.  Discussed transcutaneous placement from placement with the family with risk benefits alternatives and indications.  Family is in complete agreement to proceed. Shared Decision Making/Informed Consent: Risks include but not limited to bleeding, infection, pneumothorax, arrhythmia/ventricular tachycardia or bradycardic arrest with minimal chance of RA/RV perforation.    CRITICAL CARE  The patient is critically ill with multiple organ systems failure and requires high complexity decision making for assessment and support, frequent evaluation and titration of therapies, application of advanced monitoring technologies and extensive interpretation of multiple databases.    Critical Care Time devoted to patient care services described in this note is  65 Minutes.   Performed HT:DSKAJ [place] care time was exclusive of separately billable procedures and treating other patients.  Critical care was necessary to treat or prevent imminent or life-threatening deterioration.  Critical care was time spent personally by me on the following activities: development of treatment plan with patient and/or surrogate as well as nursing, discussions with consultants, evaluation of patient's response to treatment, examination of patient, obtaining history from patient or surrogate, ordering and performing treatments and interventions, ordering and review of laboratory studies, ordering and review of radiographic studies, pulse oximetry and re-evaluation of patient's condition.  This time reflects time of care of this signee Glenetta Hew, MD)  This critical care time does not reflect procedure time, or teaching time or supervisory time of PA/NP/Med student/Med Resident etc but could involve care discussion time  with the patient, family & RN staff.          Signed, Glenetta Hew, MD  08/30/2021, 8:45 AM

## 2021-09-23 NOTE — Progress Notes (Signed)
NAME:  Aaron Carlson, MRN:  [id number], DOB:  June 10, 1933, LOS: 3 ADMISSION DATE:  09/27/2021, CONSULTATION DATE:  12/24 REFERRING MD:  Darl Householder, CHIEF COMPLAINT:  syncope   History of Present Illness:  Patient is encephalopathic and/or intubated. Therefore history has been obtained from chart review.   Aaron Carlson, is a 87 y.o. male, who presented to the [facility] ED with a chief complaint of syncope  They have a pertinent past medical history of CAD, combined systolic and diastolic HF, HTN, HLD, DM, CKD 3/4, GERD  Per documentation, patient had witnessed syncopal episode while at home, hit head on ground, reported unresponsive for 1 min. On arrival EMS found patient in complete heart block with rate of 30. Patient reported oriented x4. Atropine given, pacing initiated, 5mg  versed given. Per EDP patient removed pacing pads, HR decreased, and then became pulseless. CPR initiated with 3 rounds of CPR and 1 epi  He was intubated in the ED. Cardiology was consulted. CT head is pending. Trop 25. ECG: ST, RBBB, IVCD ,7.29/36/352.17.2. CXR with no infiltrate, pneumo, or effusion.   PCCM was consulted for admission.   Pertinent  Medical History  CAD, combined systolic and diastolic HF, HTN, HLD, DM, CKD 3/4, GERD  Significant Hospital Events: Including procedures, antibiotic start and stop dates in addition to other pertinent events   12/24 Syncope, EMS called, 3rd degree HB, paced, removed pads, asystole, CPR, intubated in ED. CT head> negative. 12/25 successfully extubated 12/26 developed acute right-sided weakness facial droop.  Seen by neurology.  Has 23 mm ICH in left MCA territory, SJ H and left scalp hematoma.  Heparin given for possible ACS stopped and reversed with protamine. 12/27 recurrent arrest  Interim History / Subjective:  Went into asystole again this AM with intermittent CPR x 10 minutes. Now externally paced, intubated, sedated  Objective   Blood pressure 119/65, pulse (!)  105, temperature (!) 97.4 F (36.3 C), temperature source Axillary, resp. rate (!) 37, height 5\' 8"  (1.727 m), weight 90.6 kg, SpO2 98 %.    Vent Mode: PRVC FiO2 (%):  [100 %] 100 % Set Rate:  [16 bmp] 16 bmp Vt Set:  [540 mL] 540 mL PEEP:  [5 cmH20] 5 cmH20 Plateau Pressure:  [25 cmH20] 25 cmH20   Intake/Output Summary (Last 24 hours) at 09/27/2021 0844 Last data filed at 09/21/2021 0700 Gross per 24 hour  Intake 2367.37 ml  Output 1300 ml  Net 1067.37 ml    Filed Weights   09/14/2021 2200 09/27/2021 0600  Weight: 91.9 kg 90.6 kg    Examination: Ill appearing man in bed intuated/sedated paralyzed During CPR moved L arm purposefully toward face; was talking prior to arrest Pupils dilated, sluggish Lungs with rhonci Ext warm  Cr slightly improved K low CBC stable Post arrest CPR  Assessment & Plan:  Acute intracranial hemorrhage likely secondary to subclinical cerebral contusion at time of initial syncope.  Hemorrhaged when started on anticoagulation.  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 GERD  - Continue vent support - Continue epi drip - Probably needs temp TVP if family wants everything  done and see how next few days go with neurological status - No role for ICP interventions at present, will get another head CT in PM - Continue family discussions  Best Practice (right click and "Reselect all SmartList Selections" daily)   Diet/type: NPO w/ meds via tube DVT prophylaxis: SCD, GI prophylaxis: PPI Lines: N/A Foley:  Yes, and it is still  needed Code Status:  full code Last date of multidisciplinary goals of care discussion [Full scope, discussed with Everlena Hornsby at bedside on 12/27 ]   Patient critically ill due to shock, resp failure Interventions to address this today vent/pressor titraton Risk of deterioration without these interventions is high  I personally spent 40 minutes providing critical care not including any separately billable procedures  Erskine Emery MD [facility]  Prefer epic messenger for cross cover needs If after hours, please call E-link

## 2021-09-23 NOTE — Procedures (Signed)
Intubation Procedure Note  MONTOYA BRANDEL  [id number]  1932-12-10  Date:09/09/2021  Time:8:43 AM   Provider Performing:Robecca Fulgham C Tamala Julian    Procedure: Intubation (00867)  Indication(s) Respiratory Failure  Consent Unable to obtain consent due to emergent nature of procedure.   Anesthesia Etomidate, Fentanyl, and Rocuronium   Time Out Verified patient identification, verified procedure, site/side was marked, verified correct patient position, special equipment/implants available, medications/allergies/relevant history reviewed, required imaging and test results available.   Sterile Technique Usual hand hygeine, masks, and gloves were used   Procedure Description Patient positioned in bed supine.  Sedation given as noted above.  Patient was intubated with endotracheal tube using Glidescope.  View was Grade 1 full glottis .  Number of attempts was 1.  Colorimetric CO2 detector was consistent with tracheal placement.   Complications/Tolerance None; patient tolerated the procedure well. Chest X-ray is ordered to verify placement.   EBL Minimal   Specimen(s) None

## 2021-09-23 NOTE — Plan of Care (Signed)

## 2021-09-23 NOTE — Consult Note (Signed)
ELECTROPHYSIOLOGY CONSULT NOTE    Patient ID: Aaron Carlson MRN: [id number], DOB/AGE: Sep 19, 1933 87 y.o.  Admit date: 09/14/2021 Date of Consult: 09/27/2021  Primary Physician: Antony Contras, MD Primary Cardiologist: Sinclair Grooms, MD  Electrophysiologist:  New  Referring Provider: Dr. Ellyn Hack  Patient Profile: Aaron Carlson is a 87 y.o. male with a history of CAD, combined systolic and diastolic HF, HTN, HLD, DM, CKD 3/4, and GERD who is being seen today for the evaluation of asystole and CHB at the request of Dr. Ellyn Hack.  HPI:  Aaron Carlson is a 87 y.o. male who presented to [facility] with chief compliant of syncope. Pertinent medical history includes above  Per documentation, patient had witnessed syncopal episode while at home, hit head on ground, reported unresponsive for 1 min. On arrival EMS found patient in complete heart block with rate of 30. Patient reported oriented x4. Atropine given, pacing initiated, 61m versed given. Per EDP patient removed pacing pads, HR decreased, and then became pulseless. CPR initiated with 3 rounds of CPR and 1 epi  Extubated 12/25, pt reported being in his USOH up to the day prior. He'd had some SOB with exertion, but otherwise had been well. He didn't remember having any acute chest pain or SOB prior to the episode.   12/26 HRs had improved with holding BB, but unfortunately he developed acute right-sided weakness facial droop.  Seen by neurology.  Has 23 mm ICH in left MCA territory, SJ H and left scalp hematoma.  Heparin that had been given for possible ACS stopped and reversed with protamine.  This am he had recurrent asystolic arrest followed by VT requiring CPR x 10 minutes. Now re-intubated and sedated.  ROS: Patient is encephalopathic and/or intubated. History has been obtained from chart review.   Past Medical History:  Diagnosis Date   CAD in native artery 08/28/2013   Heaviness in chest with exertion. Coronary angiography  2005 demonstrated severe distal LAD and diagonal disease, severe distal RCA and PL OM disease, and severe first obtuse marginal disease. No high-grade proximal coronary disease was present at the time.  The most recent myocardial perfusion study in 2014 was nonischemic/low risk     Chronic renal insufficiency    Colon polyps    adenomatous   Diabetes mellitus    Hyperlipemia    Hypertension    Laryngeal papillomatosis    [place]    w/ mets to bladder     Surgical History:  Past Surgical History:  Procedure Laterality Date   BLADDER SURGERY     CATARACT EXTRACTION Left 02/2020   CIRCUMCISION  2011   POLYPECTOMY     vocal cords   PROSTATE SURGERY       Medications Prior to Admission  Medication Sig Dispense Refill Last Dose   atorvastatin (LIPITOR) 20 MG tablet Take 20 mg by mouth at bedtime.   09/19/2021 at pm   benazepril (LOTENSIN) 20 MG tablet TAKE 1 TABLET EVERY DAY (Patient taking differently: Take 20 mg by mouth daily.) 90 tablet 3 09/01/2021   dorzolamide (TRUSOPT) 2 % ophthalmic solution Place 1 drop into both eyes 2 (two) times daily.   09/19/2021   fluorometholone (FML) 0.1 % ophthalmic suspension Place 1 drop into the left eye 3 (three) times daily.      furosemide (LASIX) 80 MG tablet Take 80 mg by mouth daily.   09/12/2021 at am   isosorbide mononitrate (IMDUR) 60 MG 24 hr tablet  Take 1 tablet (60 mg total) by mouth daily. 90 tablet 3 08/28/2021   ketorolac (ACULAR) 0.4 % SOLN Place 1 drop into the left eye 4 (four) times daily.   Past Week   latanoprost (XALATAN) 0.005 % ophthalmic solution Place 1 drop into both eyes at bedtime.   09/19/2021 at pm   metoprolol succinate (TOPROL XL) 25 MG 24 hr tablet Take 1 tablet (25 mg total) by mouth daily. 90 tablet 3 09/02/2021 at 0730   nitroGLYCERIN (NITROSTAT) 0.4 MG SL tablet Place 1 tablet (0.4 mg total) under the tongue every 5 (five) minutes as needed for chest pain. 25 tablet 3 unk   NON FORMULARY Take 1-2  capsules by mouth See admin instructions. Colon Clenz capsules- Take 1-2 capsules by mouth once a day as needed for constipation   unk   NOVOLIN N 100 UNIT/ML injection Inject 38 Units into the skin at bedtime.   09/19/2021 at pm   pantoprazole (PROTONIX) 40 MG tablet Take 40 mg by mouth daily as needed (for heartburn).   unk   potassium chloride (KLOR-CON) 10 MEQ tablet Take 10 mEq by mouth daily.   09/22/2021   prednisoLONE acetate (PRED FORTE) 1 % ophthalmic suspension Place 1 drop into both eyes at bedtime.   09/19/2021 at pm   SIMBRINZA 1-0.2 % SUSP Place 1 drop into both eyes 2 (two) times daily.   Past Week   simethicone (MYLICON) 650 MG chewable tablet Chew 125 mg by mouth every 6 (six) hours as needed for flatulence.   unk   Accu-Chek Softclix Lancets lancets       Alcohol Swabs 70 % PADS use when checking blood sugar      BD INSULIN SYRINGE ULTRAFINE 31G X 5/16" 0.5 ML MISC Use as directed      Blood Glucose Monitoring Suppl (TRUE METRIX METER) w/Device KIT       brimonidine (ALPHAGAN) 0.2 % ophthalmic solution Place 1 drop into both eyes 2 (two) times daily.   See LF at unk   calcium carbonate (TUMS - DOSED IN MG ELEMENTAL CALCIUM) 500 MG chewable tablet Chew 1 tablet by mouth daily as needed for indigestion or heartburn.   unk   Continuous Blood Gluc Receiver (FREESTYLE LIBRE 2 READER) DEVI See admin instructions.      Continuous Blood Gluc Sensor (FREESTYLE LIBRE 2 SENSOR) MISC apply to upper back of arm   unk   fluticasone (FLONASE) 50 MCG/ACT nasal spray Place into both nostrils daily as needed for allergies or rhinitis.   unk   insulin lispro (HUMALOG) 100 UNIT/ML injection Use as directed daily at bedtime as directed per sliding scale.   See note at unk   Iron Combinations (CHROMAGEN) capsule Take 1 capsule by mouth daily.   unk   Latanoprostene Bunod (VYZULTA) 0.024 % SOLN 1 drop into affected eye in the evening   unk   MAGNESIUM PO Take 1 tablet by mouth daily.   unk   Multiple  Vitamin (MULTIVITAMIN) tablet Take 1 tablet by mouth daily.   unk   niacin (NIASPAN) 500 MG CR tablet Take 500 mg by mouth at bedtime.   unk   niacin (SLO-NIACIN) 500 MG tablet 1 tablet with food   unk   Omega-3 Fatty Acids (FISH OIL) 1000 MG CAPS 1 capsule   unk   ONE TOUCH ULTRA TEST test strip Use as directed   N/A   Peppermint Oil (IBGARD) 90 MG CPCR 3-4 capsules   unk  Probiotic TBEC See admin instructions.      White Petrolatum-Mineral Oil (ARTIFICIAL TEARS) ointment Place into the left eye in the morning, at noon, and at bedtime.   unk    Inpatient Medications:    stroke: mapping our early stages of recovery book   Does not apply Once   acetaminophen  650 mg Oral Q4H   Or   acetaminophen (TYLENOL) oral liquid 160 mg/5 mL  650 mg Per Tube Q4H   Or   acetaminophen  650 mg Rectal Q4H   atorvastatin  40 mg Oral Daily   chlorhexidine gluconate (MEDLINE KIT)  15 mL Mouth Rinse BID   Chlorhexidine Gluconate Cloth  6 each Topical Daily   docusate  100 mg Per Tube BID   fentaNYL (SUBLIMAZE) injection  25 mcg Intravenous Once   fentaNYL (SUBLIMAZE) injection  25 mcg Intravenous Once   insulin aspart  0-15 Units Subcutaneous Q4H   lidocaine  1 patch Transdermal Q24H   mouth rinse  15 mL Mouth Rinse 10 times per day   pantoprazole sodium  40 mg Per Tube QHS   polyethylene glycol  17 g Per Tube Daily   senna-docusate  1 tablet Oral BID    Allergies:  Allergies  Allergen Reactions   Penicillins Rash    Social History   Socioeconomic History   Marital status: Married    Spouse name: Not on file   Number of children: 4   Years of education: Not on file   Highest education level: Not on file  Occupational History   Occupation: Retired  Tobacco Use   Smoking status: Former   Smokeless tobacco: Never  Scientific laboratory technician Use: Never used  Substance and Sexual Activity   Alcohol use: Yes    Comment: social   Drug use: No   Sexual activity: Not on file  Other Topics Concern    Not on file  Social History Narrative   Not on file   Social Determinants of Health   Financial Resource Strain: Not on file  Food Insecurity: Not on file  Transportation Needs: Not on file  Physical Activity: Not on file  Stress: Not on file  Social Connections: Not on file  Intimate Partner Violence: Not on file     Family History  Problem Relation Age of Onset   Breast cancer Mother    Diabetes Mother    Heart failure Father    Heart disease Father    Pancreatic cancer Brother    Colon cancer Neg Hx    Esophageal cancer Neg Hx    Liver disease Neg Hx      Review of Systems: All other systems reviewed and are otherwise negative except as noted above.  Physical Exam: Vitals:   09/05/2021 0600 08/31/2021 0630 09/02/2021 0700 09/16/2021 0842  BP: 129/70 128/61 119/65   Pulse: 98 97 (!) 105 72  Resp: (!) 33 (!) 27 (!) 37 16  Temp:   (!) 97.4 F (36.3 C)   TempSrc:   Axillary   SpO2: 98% 96% 95% 98%  Weight: 90.6 kg     Height:        GEN- The patient is intubated and sedated.  HEENT: +ETT in place.  Lungs- + mechanical breathing sounds.  Heart- Regular rate and rhythm,  GI- soft, non-tender, non-distended, bowel sounds present Extremities- no clubbing, cyanosis, or edema; DP/PT/radial pulses 2+ bilaterally MS- no significant deformity or atrophy Skin- warm and dry, no rash or  lesion Neuro- Intubated and sedated.    Labs:   Lab Results  Component Value Date   WBC 13.6 (H) 09/22/2021   HGB 9.6 (L) 09/25/2021   HCT 28.7 (L) 09/01/2021   MCV 91.4 09/07/2021   PLT 167 08/30/2021    Recent Labs  Lab 09/27/2021 1806 09/19/2021 1823 08/31/2021 0036  NA 137   < > 142  K 3.6   < > 3.1*  CL 110   < > 121*  CO2 19*   < > 14*  BUN 38*   < > 42*  CREATININE 2.18*   < > 1.74*  CALCIUM 8.4*   < > 8.6*  PROT 6.2*  --   --   BILITOT 0.7  --   --   ALKPHOS 90  --   --   ALT 36  --   --   AST 35  --   --   GLUCOSE 331*   < > 164*   < > = values in this interval not  displayed.      Radiology/Studies: CT HEAD WO CONTRAST (5MM)  Result Date: 09/03/2021 CLINICAL DATA:  Hemorrhagic stroke follow-up EXAM: CT HEAD WITHOUT CONTRAST TECHNIQUE: Contiguous axial images were obtained from the base of the skull through the vertex without intravenous contrast. COMPARISON:  Yesterday FINDINGS: Brain: 29 x 40 mm hematoma the left frontal parietal junction with regional low-density swelling that is mildly progressed. Anteriorly is a low-density space that has filled in with blood. Still no midline shift of the atrophic brain. Small volume local subarachnoid hemorrhage. No hydrocephalus. Vascular: Atheromatous calcification. Skull: Negative for fracture.  Left parietal scalp swelling Sinuses/Orbits: Negative IMPRESSION: Mildly increased edema around the left cerebral hemorrhage which is not significantly increased in size. Electronically Signed   By: Jorje Guild M.D.   On: 09/24/2021 06:31   CT HEAD WO CONTRAST (5MM)  Result Date: 09/16/2021 CLINICAL DATA:  Trauma. EXAM: CT HEAD WITHOUT CONTRAST CT CERVICAL SPINE WITHOUT CONTRAST TECHNIQUE: Multidetector CT imaging of the head and cervical spine was performed following the standard protocol without intravenous contrast. Multiplanar CT image reconstructions of the cervical spine were also generated. COMPARISON:  CT dated 09/19/2011. FINDINGS: CT HEAD FINDINGS Brain: Moderate age-related atrophy and chronic microvascular ischemic changes. There is no acute intracranial hemorrhage. No mass effect or midline shift. No extra-axial fluid collection. Vascular: No hyperdense vessel or unexpected calcification. Skull: Normal. Negative for fracture or focal lesion. Sinuses/Orbits: The visualized paranasal sinuses and mastoid air cells are clear. Other: Left posterior scalp contusion. CT CERVICAL SPINE FINDINGS Alignment: No acute subluxation. Skull base and vertebrae: No acute fracture. Soft tissues and spinal canal: No prevertebral fluid  or swelling. No visible canal hematoma. Disc levels: Multilevel degenerative changes with disc space narrowing and endplate irregularity and spurring. Upper chest: Partially visualized bilateral upper lobe and biapical pulmonary densities which may represent edema, pneumonia, or contusion. Other: An endotracheal tube and an enteric tube are partially visualized. Bilateral carotid bulb calcified plaques. IMPRESSION: 1. No acute intracranial pathology. Moderate age-related atrophy and chronic microvascular ischemic changes. 2. No acute/traumatic cervical spine pathology. Multilevel degenerative changes. 3. Partially visualized bilateral upper lobe and biapical pulmonary densities which may represent edema, pneumonia, or contusion. Electronically Signed   By: Anner Crete M.D.   On: 09/05/2021 21:42   CT Cervical Spine Wo Contrast  Result Date: 09/03/2021 CLINICAL DATA:  Trauma. EXAM: CT HEAD WITHOUT CONTRAST CT CERVICAL SPINE WITHOUT CONTRAST TECHNIQUE: Multidetector CT imaging of the head  and cervical spine was performed following the standard protocol without intravenous contrast. Multiplanar CT image reconstructions of the cervical spine were also generated. COMPARISON:  CT dated 09/19/2011. FINDINGS: CT HEAD FINDINGS Brain: Moderate age-related atrophy and chronic microvascular ischemic changes. There is no acute intracranial hemorrhage. No mass effect or midline shift. No extra-axial fluid collection. Vascular: No hyperdense vessel or unexpected calcification. Skull: Normal. Negative for fracture or focal lesion. Sinuses/Orbits: The visualized paranasal sinuses and mastoid air cells are clear. Other: Left posterior scalp contusion. CT CERVICAL SPINE FINDINGS Alignment: No acute subluxation. Skull base and vertebrae: No acute fracture. Soft tissues and spinal canal: No prevertebral fluid or swelling. No visible canal hematoma. Disc levels: Multilevel degenerative changes with disc space narrowing and  endplate irregularity and spurring. Upper chest: Partially visualized bilateral upper lobe and biapical pulmonary densities which may represent edema, pneumonia, or contusion. Other: An endotracheal tube and an enteric tube are partially visualized. Bilateral carotid bulb calcified plaques. IMPRESSION: 1. No acute intracranial pathology. Moderate age-related atrophy and chronic microvascular ischemic changes. 2. No acute/traumatic cervical spine pathology. Multilevel degenerative changes. 3. Partially visualized bilateral upper lobe and biapical pulmonary densities which may represent edema, pneumonia, or contusion. Electronically Signed   By: Anner Crete M.D.   On: 09/03/2021 21:42   MR BRAIN W WO CONTRAST  Result Date: 09/22/2021 CLINICAL DATA:  Stroke. Follow-up intracranial hemorrhage. Assess for underlying lesion. EXAM: MRI HEAD WITHOUT AND WITH CONTRAST TECHNIQUE: Multiplanar, multiecho pulse sequences of the brain and surrounding structures were obtained without and with intravenous contrast. CONTRAST:  54m GADAVIST GADOBUTROL 1 MMOL/ML IV SOLN COMPARISON:  09/19/2021.  CT earlier same day. FINDINGS: Brain: The study suffers from considerable motion degradation. No focal abnormality is seen affecting the brainstem or cerebellum. Right cerebral hemisphere shows mild chronic small-vessel ischemic changes of the white matter. Left cerebral hemisphere shows a 4.2 x 3 cm in diameter hematoma at the left frontoparietal junction with surrounding edema. After contrast administration, there are a few punctate foci of contrast enhancement or accumulation along the posterior wall. The possibility of ongoing bleeding does exist, and it may be prudent to follow this with an additional CT in a few hours to make sure the hematoma is not enlarging further. There is no clear underlying mass lesion, but that is not completely excluded and should be evaluated subsequently. There is no midline shift. No hydrocephalus.  No extra-axial collection. Vascular: Major vessels at the base of the brain show flow. Skull and upper cervical spine: Negative Sinuses/Orbits: Clear/normal Other: None IMPRESSION: Considerable motion degradation. Redemonstration of an intraparenchymal hematoma at the left frontoparietal junction measuring approximately 3 x 4.2 cm as seen previously. Surrounding edema. Question of punctate enhancement or contrast accumulation along the posterior wall. This could represent slow ongoing bleeding, and I would suggest repeat CT evaluation in several hours to make sure that the hematoma is not enlarging further. At this point, we have not diagnose an underlying mass lesion, but the patient should be restudied after recovery from the acute phase. Electronically Signed   By: MNelson ChimesM.D.   On: 09/22/2021 11:57   DG Chest Port 1 View  Result Date: 09/03/2021 CLINICAL DATA:  ET/NG tube placement EXAM: PORTABLE CHEST 1 VIEW COMPARISON:  Chest x-ray dated September 20, 2021 FINDINGS: ET tube is unchanged in position. OG tube tip is in the stomach with side port near the GE junction. Mild bilateral heterogeneous pulmonary opacities, similar to prior exam. No large pleural effusion or  evidence of pneumothorax. Cardiac and mediastinal contours are unchanged. IMPRESSION: 1. OG tube tip is in the stomach with side port near the GE junction, consider advancement for optimal positioning. 2. Mild bilateral heterogeneous pulmonary opacities, likely a combination of atelectasis and pulmonary edema. Radiology ilia speaking Electronically Signed   By: Yetta Glassman M.D.   On: 09/19/2021 09:07   DG Chest Port 1 View  Result Date: 09/24/2021 CLINICAL DATA:  Endotracheal tube and nasogastric tube placement. Cardiac arrest. EXAM: PORTABLE CHEST 1 VIEW COMPARISON:  April 14, 2011 FINDINGS: Endotracheal tube projects 6.2 cm above the carina. Nasogastric tube courses below the diaphragm with tip excluded by collimation. Cardiac  defibrillator pads overlie the left chest and upper abdomen. The heart size and mediastinal contours are partially obscured but appear within normal limits. Left lung field is partially obscured by overlying defibrillator lead. Right lung appears clear. No visible pleural effusion or pneumothorax. The visualized skeletal structures are unremarkable. IMPRESSION: 1. Endotracheal tube projects 6.2 cm above the carina. 2. Nasogastric tube courses below the diaphragm with tip excluded by collimation. Electronically Signed   By: Dahlia Bailiff M.D.   On: 09/08/2021 18:42   DG Swallowing Func-Speech Pathology  Result Date: 09/22/2021 Table formatting from the original result was not included. Objective Swallowing Evaluation: Type of Study: MBS-Modified Barium Swallow Study  Patient Details Name: Aaron Carlson MRN: [id number] Date of Birth: 05/30/1933 Today's Date: 09/22/2021 Time: SLP Start Time (ACUTE ONLY): 1458 -SLP Stop Time (ACUTE ONLY): 1516 SLP Time Calculation (min) (ACUTE ONLY): 18 min Past Medical History: Past Medical History: Diagnosis Date  CAD in native artery 08/28/2013  Heaviness in chest with exertion. Coronary angiography 2005 demonstrated severe distal LAD and diagonal disease, severe distal RCA and PL OM disease, and severe first obtuse marginal disease. No high-grade proximal coronary disease was present at the time.  The most recent myocardial perfusion study in 2014 was nonischemic/low risk    Chronic renal insufficiency   Colon polyps   adenomatous  Diabetes mellitus   Hyperlipemia   Hypertension   Laryngeal papillomatosis   Prostate cancer ([place])   w/ mets to bladder Past Surgical History: Past Surgical History: Procedure Laterality Date  BLADDER SURGERY    CATARACT EXTRACTION Left 02/2020  CIRCUMCISION  2011  POLYPECTOMY    vocal cords  PROSTATE SURGERY   HPI: Pt is an 87 yo male presenting with syncope and found unresponsive. Pt went into PEA arrest requiring CPR x3 minutes with ROSC. ETT  12/24-12/25. Pt initially passed a yale swallow screen post-extubation but then a code stroke was called in the early morning on 12/26 due to R sided weakness and slurred speech. CTH on admission negative for acute changes but repeat on 12/26 revealed acute L sided ICH. MRI with considerable motion degradation but redmonstrates intraparenchymal hematoma at the L frontoparietal junction with surrounding edema. PMH significant for recurrent laryngeal papillomatosis s/p laser excision, CAD, DM2, HTN, HLD, Prostrate Cancer  Subjective: lethargic  Recommendations for follow up therapy are one component of a multi-disciplinary discharge planning process, led by the attending physician.  Recommendations may be updated based on patient status, additional functional criteria and insurance authorization. Assessment / Plan / Recommendation Clinical Impressions 09/22/2021 Clinical Impression Pt has a moderate oropharyngeal dysphagia due to sensory and motor deficits, perhaps further exacerbated during this study given pt lethargy. He has weak labial seal on the R that allows for anterior loss, as well as reduced lingual propulsion and reduced coordination for posterior  transit. Oral residue remains, especially on his R side and in his anterior sulcus. His bolus cohesion is reduced and his timing is inconsistent for swallow trigger. Most consistencies initiate a swallow at the pyriform sinuses at least intermittently, although the thinner the consistency, the more consistently it reaches the pyriform sinuses before the swallow. Thin and nectar thick liquids more consistently enter the airway before the swallow in small quantities, but aspiration is silent and his cued cough is too weak to clear his airway. Pt can contain honey thick liquids and purees in his valleculae at times, but deep penetration to the vocal folds occurs when his timing is off. There is also more residue in the valleculae and posterior pharyngeal wall due to  reduced base of tongue retraction and pharyngeal squeeze. A chin tuck helps to improve airway protection and slightly reduce residue with purees. It helps with honey thick liquids as well, but he can't get boluses out of a straw and he can't coordinate a chin tuck with cup sips. A spoon had to be used. Recommend starting with Dys 1 (puree) diet and honey thick liquids by spoon with full supervision for use of a chin tuck. Anticipate that it would be hard for him to meet his nutritional needs with such restrictions and with current lethargy, so may want to consider temporary alternative means of nutrition to supplement PO diet. Will f/u for ongoing dysphagia management. SLP Visit Diagnosis Dysphagia, oropharyngeal phase (R13.12) Attention and concentration deficit following -- Frontal lobe and executive function deficit following -- Impact on safety and function Moderate aspiration risk;Risk for inadequate nutrition/hydration   Treatment Recommendations 09/22/2021 Treatment Recommendations Therapy as outlined in treatment plan below   Prognosis 09/22/2021 Prognosis for Safe Diet Advancement Good Barriers to Reach Goals -- Barriers/Prognosis Comment -- Diet Recommendations 09/22/2021 SLP Diet Recommendations Dysphagia 1 (Puree) solids;Honey thick liquids Liquid Administration via Spoon Medication Administration Crushed with puree Compensations Minimize environmental distractions;Slow rate;Small sips/bites;Lingual sweep for clearance of pocketing;Monitor for anterior loss;Chin tuck Postural Changes Seated upright at 90 degrees;Remain semi-upright after after feeds/meals (Comment)   Other Recommendations 09/22/2021 Recommended Consults -- Oral Care Recommendations Oral care BID Other Recommendations Order thickener from pharmacy;Prohibited food (jello, ice cream, thin soups);Remove water pitcher;Have oral suction available Follow Up Recommendations Acute inpatient rehab (3hours/day) Assistance recommended at discharge  Intermittent Supervision/Assistance Functional Status Assessment Patient has had a recent decline in their functional status and demonstrates the ability to make significant improvements in function in a reasonable and predictable amount of time. Frequency and Duration  09/22/2021 Speech Therapy Frequency (ACUTE ONLY) min 2x/week Treatment Duration 2 weeks   Oral Phase 09/22/2021 Oral Phase Impaired Oral - Pudding Teaspoon -- Oral - Pudding Cup -- Oral - Honey Teaspoon Decreased bolus cohesion;Lingual/palatal residue;Reduced posterior propulsion;Right anterior bolus loss Oral - Honey Cup Decreased bolus cohesion;Lingual/palatal residue;Reduced posterior propulsion;Right anterior bolus loss Oral - Nectar Teaspoon -- Oral - Nectar Cup Decreased bolus cohesion;Lingual/palatal residue;Reduced posterior propulsion;Right anterior bolus loss Oral - Nectar Straw Decreased bolus cohesion;Lingual/palatal residue;Reduced posterior propulsion Oral - Thin Teaspoon -- Oral - Thin Cup Decreased bolus cohesion;Lingual/palatal residue;Reduced posterior propulsion;Right anterior bolus loss Oral - Thin Straw -- Oral - Puree Decreased bolus cohesion;Lingual/palatal residue;Reduced posterior propulsion;Right anterior bolus loss Oral - Mech Soft -- Oral - Regular -- Oral - Multi-Consistency -- Oral - Pill -- Oral Phase - Comment --  Pharyngeal Phase 09/22/2021 Pharyngeal Phase Impaired Pharyngeal- Pudding Teaspoon -- Pharyngeal -- Pharyngeal- Pudding Cup -- Pharyngeal -- Pharyngeal- Honey Teaspoon Reduced  pharyngeal peristalsis;Reduced tongue base retraction;Pharyngeal residue - valleculae;Delayed swallow initiation-pyriform sinuses;Compensatory strategies attempted (with notebox) Pharyngeal -- Pharyngeal- Honey Cup Reduced pharyngeal peristalsis;Reduced tongue base retraction;Penetration/Apiration after swallow;Pharyngeal residue - valleculae;Delayed swallow initiation-pyriform sinuses Pharyngeal Material enters airway, CONTACTS cords  and not ejected out Pharyngeal- Nectar Teaspoon -- Pharyngeal -- Pharyngeal- Nectar Cup Delayed swallow initiation-pyriform sinuses;Penetration/Aspiration before swallow;Reduced tongue base retraction;Pharyngeal residue - valleculae Pharyngeal Material enters airway, CONTACTS cords and not ejected out Pharyngeal- Nectar Straw Delayed swallow initiation-pyriform sinuses;Penetration/Aspiration before swallow;Reduced tongue base retraction;Pharyngeal residue - valleculae Pharyngeal Material enters airway, passes BELOW cords without attempt by patient to eject out (silent aspiration) Pharyngeal- Thin Teaspoon -- Pharyngeal -- Pharyngeal- Thin Cup Delayed swallow initiation-pyriform sinuses;Penetration/Aspiration before swallow;Reduced tongue base retraction Pharyngeal Material enters airway, passes BELOW cords without attempt by patient to eject out (silent aspiration) Pharyngeal- Thin Straw -- Pharyngeal -- Pharyngeal- Puree Reduced pharyngeal peristalsis;Reduced tongue base retraction;Penetration/Apiration after swallow;Pharyngeal residue - valleculae;Delayed swallow initiation-pyriform sinuses;Penetration/Aspiration during swallow;Reduced airway/laryngeal closure Pharyngeal Material enters airway, CONTACTS cords and not ejected out Pharyngeal- Mechanical Soft -- Pharyngeal -- Pharyngeal- Regular -- Pharyngeal -- Pharyngeal- Multi-consistency -- Pharyngeal -- Pharyngeal- Pill -- Pharyngeal -- Pharyngeal Comment --  Cervical Esophageal Phase  09/22/2021 Cervical Esophageal Phase WFL Pudding Teaspoon -- Pudding Cup -- Honey Teaspoon -- Honey Cup -- Nectar Teaspoon -- Nectar Cup -- Nectar Straw -- Thin Teaspoon -- Thin Cup -- Thin Straw -- Puree -- Mechanical Soft -- Regular -- Multi-consistency -- Pill -- Cervical Esophageal Comment -- Osie Bond., M.A. [place] Pager [phone] Office [phone] 09/22/2021, 4:29 PM                     ECHOCARDIOGRAM COMPLETE  Result Date:  09/21/2021    ECHOCARDIOGRAM REPORT   Patient Name:   Aaron Carlson Date of Exam: 09/21/2021 Medical Rec #:  [id number]       Height:       68.0 in Accession #:    [id number]      Weight:       202.6 lb Date of Birth:  1932-10-02       BSA:          2.055 m Patient Age:    20 years        BP:           105/53 mmHg Patient Gender: M               HR:           73 bpm. Exam Location:  Inpatient Procedure: 2D Echo Indications:     cardiac arrest  History:         Patient has prior history of Echocardiogram examinations, most                  recent 12/06/2020. CAD; Risk Factors:Diabetes and Hypertension.  Sonographer:     Johny Chess RDCS Referring Phys:  [id number] Estill Cotta Diagnosing Phys: Glori Bickers MD IMPRESSIONS  1. Left ventricular ejection fraction, by estimation, is 50 to 55%. The left ventricle has low normal function. The left ventricle has no regional wall motion abnormalities. Left ventricular diastolic parameters are consistent with Grade I diastolic dysfunction (impaired relaxation). There is hypokinesis of the left ventricular, basal-mid inferior wall and inferolateral wall.  2. Right ventricular systolic function is normal. The right ventricular size is normal. There is normal pulmonary artery systolic pressure.  3. Left atrial size was mildly dilated.  4. The mitral  valve is normal in structure. No evidence of mitral valve regurgitation. No evidence of mitral stenosis.  5. The aortic valve has an indeterminant number of cusps. There is moderate calcification of the aortic valve. Aortic valve regurgitation is not visualized. Aortic valve sclerosis/calcification is present, without any evidence of aortic stenosis.  6. The inferior vena cava is normal in size with greater than 50% respiratory variability, suggesting right atrial pressure of 3 mmHg. FINDINGS  Left Ventricle: Left ventricular ejection fraction, by estimation, is 50 to 55%. The left ventricle has low normal function. The  left ventricle has no regional wall motion abnormalities. The left ventricular internal cavity size was normal in size. There is no left ventricular hypertrophy. Left ventricular diastolic parameters are consistent with Grade I diastolic dysfunction (impaired relaxation). Right Ventricle: The right ventricular size is normal. No increase in right ventricular wall thickness. Right ventricular systolic function is normal. There is normal pulmonary artery systolic pressure. The tricuspid regurgitant velocity is 2.08 m/s, and  with an assumed right atrial pressure of 3 mmHg, the estimated right ventricular systolic pressure is 34.1 mmHg. Left Atrium: Left atrial size was mildly dilated. Right Atrium: Right atrial size was normal in size. Pericardium: There is no evidence of pericardial effusion. Mitral Valve: The mitral valve is normal in structure. No evidence of mitral valve regurgitation. No evidence of mitral valve stenosis. Tricuspid Valve: The tricuspid valve is normal in structure. Tricuspid valve regurgitation is not demonstrated. No evidence of tricuspid stenosis. Aortic Valve: The aortic valve has an indeterminant number of cusps. There is moderate calcification of the aortic valve. Aortic valve regurgitation is not visualized. Aortic valve sclerosis/calcification is present, without any evidence of aortic stenosis. Pulmonic Valve: The pulmonic valve was normal in structure. Pulmonic valve regurgitation is not visualized. No evidence of pulmonic stenosis. Aorta: The aortic root is normal in size and structure. Venous: The inferior vena cava is normal in size with greater than 50% respiratory variability, suggesting right atrial pressure of 3 mmHg. IAS/Shunts: No atrial level shunt detected by color flow Doppler.  LEFT VENTRICLE PLAX 2D LVIDd:         3.80 cm   Diastology LVIDs:         2.30 cm   LV e' medial:    4.68 cm/s LV PW:         1.00 cm   LV E/e' medial:  12.0 LV IVS:        1.10 cm   LV e' lateral:    5.66 cm/s LVOT diam:     1.90 cm   LV E/e' lateral: 9.9 LV SV:         43 LV SV Index:   21 LVOT Area:     2.84 cm  RIGHT VENTRICLE            IVC RV S prime:     6.20 cm/s  IVC diam: 1.50 cm TAPSE (M-mode): 1.5 cm LEFT ATRIUM             Index        RIGHT ATRIUM          Index LA diam:        2.50 cm 1.22 cm/m   RA Area:     9.71 cm LA Vol (A2C):   44.2 ml 21.51 ml/m  RA Volume:   21.20 ml 10.32 ml/m LA Vol (A4C):   19.7 ml 9.59 ml/m LA Biplane Vol: 32.0 ml 15.57 ml/m  AORTIC VALVE  LVOT Vmax:   85.90 cm/s LVOT Vmean:  53.400 cm/s LVOT VTI:    0.152 m  AORTA Ao Root diam: 2.90 cm Ao Asc diam:  3.10 cm MITRAL VALVE               TRICUSPID VALVE MV Area (PHT): 3.12 cm    TR Peak grad:   17.3 mmHg MV Decel Time: 243 msec    TR Vmax:        208.00 cm/s MV E velocity: 56.20 cm/s MV A velocity: 66.30 cm/s  SHUNTS MV E/A ratio:  0.85        Systemic VTI:  0.15 m                            Systemic Diam: 1.90 cm Glori Bickers MD Electronically signed by Glori Bickers MD Signature Date/Time: 09/21/2021/3:01:56 PM    Final (Updated)    OCT, Retina - OU - Both Eyes  Result Date: 09/03/2021 Right Eye Quality was borderline. Scan locations included subfoveal. Central Foveal Thickness: 240. Progression has been stable. Findings include normal foveal contour. Left Eye Quality was good. Scan locations included subfoveal. Central Foveal Thickness: 258. Progression has improved. Findings include abnormal foveal contour, cystoid macular edema. Notes OS much improved now only on topical NSAIDs.  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  87 year old male. EXAM: CT HEAD WITHOUT CONTRAST TECHNIQUE: Contiguous axial images were obtained from the base of the skull  through the vertex without intravenous contrast. COMPARISON:  Head CT 09/06/2021. FINDINGS: Brain: Rounded intra-axial mixed density hemorrhage encompasses 42 by 30 by 36 mm (AP by transverse by CC) and there is a small volume of extra-axial extension of blood into the regional subarachnoid space. This is centered in the left perirolandic region, posterior left frontal and anterior parietal lobes just above the posterior operculum. There is surrounding edema. Mild regional mass effect. But no midline shift. No intraventricular extension of blood identified. No ventriculomegaly. Basilar cisterns remain patent and normal. No superimposed evidence of cortically based acute infarction. Gray-white matter differentiation outside of the area of acute hemorrhage is stable from 2 days ago. Vascular: Calcified atherosclerosis at the skull base. No suspicious intracranial vascular hyperdensity. Skull: No skull fracture identified. Sinuses/Orbits: Visualized paranasal sinuses and mastoids are stable and well aerated. Other: Broad-based left posterior convexity scalp hematoma. No scalp soft tissue gas. Underlying calvarium appears stable and intact. Orbits appear stable, negative. ASPECTS [facility] Stroke Program Early CT Score) - Ganglionic level infarction (caudate, lentiform nuclei, internal capsule, insula, M1-M3 cortex): - Supraganglionic infarction (M4-M6 cortex): Total score (0-10 with 10 being normal): IMPRESSION: 1. Acute left side 23 mL intra-axial and perirolandic hemorrhage with small volume extension of subarachnoid blood. Regional edema, but no midline shift. No IVH or ventriculomegaly. 2. Left posterior convexity scalp hematoma without underlying skull fracture. 3. No other acute intracranial abnormality. 4. These results were communicated to Dr. Lorrin Goodell at 4:36 am on 09/22/2021 by text page via the [facility] messaging system. Electronically Signed   By: Genevie Ann M.D.   On: 09/22/2021 04:37    EKG: on admission  showed CHB at 39 bpm (personally reviewed) Baseline EKG 12/09/20 showed NSR with baseline RBBB and  LAFB  TELEMETRY: CHB with ventricular standstill requiring CPR followed by VT. Also shows sinus tachycardia 120-130 range. Intermittent pacing spikes (personally reviewed)  Assessment/Plan: Transient complete heart block:  Baseline ECG with right bundle branch block, left anterior fascicular block.  BB held for several days.  With recurrent CHB with ventricular standstill this am. Temp pacer not currently pacing or sensing currently, will need to up revised, consider IJ placement.  Coronary artery disease: HS trop on admission elevated at 7052. Pt was not complaining of chest pain. ? If due to episode of HB with CPR Will potentially need LHC. Had planned for potentially today as he improved, but now with recurrent asystole.  Complete Echo as below with normal EF and no WMA   3. Chronic diastolic heart failure: Echo 09/21/2021 LVEF 50-55% with no regional wall abnormalities.   4. Hyperlipidemia: Continue statin  5. Hypokalemia K 3.1 this am. Supp.   6. Anemia In setting of hemorrhage and acute illness Hgb 9.6 this am  Pt is critically ill and prognosis guarded. We will continue to follow along for possible PPM as he improves.   For questions or updates, please contact [place] Please consult [email or web address] for contact info under Cardiology/STEMI.  Jacalyn Lefevre, PA-C  09/17/2021 9:23 AM

## 2021-09-24 ENCOUNTER — Inpatient Hospital Stay (HOSPITAL_COMMUNITY): Payer: Medicare HMO

## 2021-09-24 DIAGNOSIS — I495 Sick sinus syndrome: Secondary | ICD-10-CM | POA: Diagnosis not present

## 2021-09-24 DIAGNOSIS — I469 Cardiac arrest, cause unspecified: Secondary | ICD-10-CM | POA: Diagnosis not present

## 2021-09-24 DIAGNOSIS — I442 Atrioventricular block, complete: Secondary | ICD-10-CM | POA: Diagnosis not present

## 2021-09-24 DIAGNOSIS — S06351A Traumatic hemorrhage of left cerebrum with loss of consciousness of 30 minutes or less, initial encounter: Secondary | ICD-10-CM | POA: Diagnosis not present

## 2021-09-24 LAB — URINALYSIS, ROUTINE W REFLEX MICROSCOPIC
Bilirubin Urine: NEGATIVE
Glucose, UA: NEGATIVE mg/dL
Ketones, ur: NEGATIVE mg/dL
Nitrite: NEGATIVE
Protein, ur: 100 mg/dL — AB
Specific Gravity, Urine: 1.014 (ref 1.005–1.030)
WBC, UA: >50 WBC/hpf — ABNORMAL HIGH (ref 0–5)
pH: 7 (ref 5.0–8.0)

## 2021-09-24 LAB — MAGNESIUM
Magnesium: 2.1 mg/dL (ref 1.7–2.4)
Magnesium: 2.2 mg/dL (ref 1.7–2.4)

## 2021-09-24 LAB — CBC
HCT: 27.4 % — ABNORMAL LOW (ref 39.0–52.0)
Hemoglobin: 9.3 g/dL — ABNORMAL LOW (ref 13.0–17.0)
MCH: 30.8 pg (ref 26.0–34.0)
MCHC: 33.9 g/dL (ref 30.0–36.0)
MCV: 90.7 fL (ref 80.0–100.0)
Platelets: 157 10*3/uL (ref 150–400)
RBC: 3.02 MIL/uL — ABNORMAL LOW (ref 4.22–5.81)
RDW: 14.8 % (ref 11.5–15.5)
WBC: 12.3 10*3/uL — ABNORMAL HIGH (ref 4.0–10.5)
nRBC: 0 % (ref 0.0–0.2)

## 2021-09-24 LAB — GLUCOSE, CAPILLARY
Glucose-Capillary: 140 mg/dL — ABNORMAL HIGH (ref 70–99)
Glucose-Capillary: 146 mg/dL — ABNORMAL HIGH (ref 70–99)
Glucose-Capillary: 166 mg/dL — ABNORMAL HIGH (ref 70–99)
Glucose-Capillary: 167 mg/dL — ABNORMAL HIGH (ref 70–99)
Glucose-Capillary: 212 mg/dL — ABNORMAL HIGH (ref 70–99)
Glucose-Capillary: 227 mg/dL — ABNORMAL HIGH (ref 70–99)

## 2021-09-24 LAB — BASIC METABOLIC PANEL
Anion gap: 10 (ref 5–15)
BUN: 43 mg/dL — ABNORMAL HIGH (ref 8–23)
CO2: 17 mmol/L — ABNORMAL LOW (ref 22–32)
Calcium: 8.2 mg/dL — ABNORMAL LOW (ref 8.9–10.3)
Chloride: 118 mmol/L — ABNORMAL HIGH (ref 98–111)
Creatinine, Ser: 2.05 mg/dL — ABNORMAL HIGH (ref 0.61–1.24)
GFR, Estimated: 31 mL/min — ABNORMAL LOW (ref >60)
Glucose, Bld: 152 mg/dL — ABNORMAL HIGH (ref 70–99)
Potassium: 3.5 mmol/L (ref 3.5–5.1)
Sodium: 145 mmol/L (ref 135–145)

## 2021-09-24 LAB — PHOSPHORUS
Phosphorus: 1.4 mg/dL — ABNORMAL LOW (ref 2.5–4.6)
Phosphorus: 4.2 mg/dL (ref 2.5–4.6)

## 2021-09-24 MED ORDER — VITAL 1.5 CAL PO LIQD
1000.0000 mL | ORAL | Status: DC
  Administered 2021-09-24 – 2021-09-25 (×2): 1000 mL

## 2021-09-24 MED ORDER — PROSOURCE TF PO LIQD
45.0000 mL | Freq: Three times a day (TID) | ORAL | Status: DC
  Administered 2021-09-24 – 2021-09-25 (×5): 45 mL
  Filled 2021-09-24 (×6): qty 45

## 2021-09-24 MED ORDER — POTASSIUM CHLORIDE 20 MEQ PO PACK
20.0000 meq | PACK | Freq: Once | ORAL | Status: AC
  Administered 2021-09-24: 07:00:00 20 meq
  Filled 2021-09-24: qty 1

## 2021-09-24 MED ORDER — SODIUM PHOSPHATES 45 MMOLE/15ML IV SOLN
30.0000 mmol | Freq: Once | INTRAVENOUS | Status: AC
  Administered 2021-09-24: 08:00:00 30 mmol via INTRAVENOUS
  Filled 2021-09-24: qty 10

## 2021-09-24 MED FILL — Lidocaine HCl Local Preservative Free (PF) Inj 1%: INTRAMUSCULAR | Qty: 30 | Status: AC

## 2021-09-24 NOTE — Plan of Care (Signed)

## 2021-09-24 NOTE — Progress Notes (Addendum)
Progress Note  Patient Name: Aaron Carlson Date of Encounter: 09/24/2021  [facility] Cardiologist: Sinclair Grooms, MD   Patient Profile     87 y.o. male presented to the hospital with an episode of syncope.  Found to have an elevated troponin.  Metoprolol was held and conduction has recovered without further episodes of bradycardia.  Had an intracranial hemorrhage overnight after admission. -> Complete heart block/prolonged pauses with P waves noted early morning 09/17/2021-CODE BLUE, CPR with ROSC. => Transvenous pacer placed.  Subjective   Overnight remained sinus rhythm/sinus tachycardia.  Almost fully off of Levophed.  Used for BP assistance with sedation.  Awake, alert.  Responds to commands.  Speaking albeit somewhat slurred.  Answering questions. No complaints of dyspnea or chest pain, just feels tired  Inpatient Medications    Scheduled Meds:   stroke: mapping our early stages of recovery book   Does not apply Once   acetaminophen  650 mg Oral Q4H   Or   acetaminophen (TYLENOL) oral liquid 160 mg/5 mL  650 mg Per Tube Q4H   Or   acetaminophen  650 mg Rectal Q4H   atorvastatin  40 mg Per Tube Daily   chlorhexidine gluconate (MEDLINE KIT)  15 mL Mouth Rinse BID   Chlorhexidine Gluconate Cloth  6 each Topical Daily   docusate  100 mg Per Tube BID   feeding supplement (PROSource TF)  45 mL Per Tube TID   fentaNYL (SUBLIMAZE) injection  25 mcg Intravenous Once   fentaNYL (SUBLIMAZE) injection  25 mcg Intravenous Once   insulin aspart  0-15 Units Subcutaneous Q4H   mouth rinse  15 mL Mouth Rinse 10 times per day   pantoprazole sodium  40 mg Per Tube QHS   polyethylene glycol  17 g Per Tube Daily   senna-docusate  1 tablet Per Tube BID   sodium chloride flush  3 mL Intravenous Q12H   Continuous Infusions:  sodium chloride Stopped (09/21/21 1539)   sodium chloride Stopped (09/02/2021 0813)   sodium chloride     cefTRIAXone (ROCEPHIN)  IV Stopped (09/24/21  1152)   epinephrine 5 mcg/min (09/22/2021 0931)   feeding supplement (VITAL 1.5 CAL) 20 mL/hr at 09/24/21 1900   fentaNYL infusion INTRAVENOUS 50 mcg/hr (09/11/2021 1305)   norepinephrine (LEVOPHED) Adult infusion Stopped (09/24/21 1721)   promethazine (PHENERGAN) injection (IM or IVPB)     PRN Meds: sodium chloride, acetaminophen **OR** acetaminophen (TYLENOL) oral liquid 160 mg/5 mL **OR** acetaminophen, etomidate, fentaNYL, gadobutrol, guaiFENesin, hydrALAZINE, midazolam, midazolam, ondansetron (ZOFRAN) IV, sodium chloride flush   Vital Signs    Vitals:   09/24/21 1900 09/24/21 1950 09/24/21 2000 09/24/21 2100  BP:      Pulse: (!) 121  (!) 117 (!) 119  Resp: (!) 36  (!) 27 (!) 35  Temp:  97.7 F (36.5 C)    TempSrc:  Oral    SpO2: 94%  97% 95%  Weight:      Height:        Intake/Output Summary (Last 24 hours) at 09/24/2021 2239 Last data filed at 09/24/2021 1800 Gross per 24 hour  Intake 1393.38 ml  Output 1550 ml  Net -156.62 ml   Last 3 Weights 09/24/2021 09/13/2021 09/05/2021  Weight (lbs) 199 lb 11.8 oz 199 lb 11.8 oz 202 lb 9.6 oz  Weight (kg) 90.6 kg 90.6 kg 91.9 kg      Telemetry    For the most part sinus tachycardia with IVCD.  Heart  rates ranging from high 90s to 120s    Personally Reviewed  ECG    Not checked  Physical Exam   GEN: Awake and alert, somewhat slow to respond and speaks somewhat unclear, but answers questions.  Follows commands.  No distress. Neck: No JVD, or bruit Cardiac: Tachycardic with regular rate.  1/6 SEM otherwise normal S1 and S2. Respiratory: Diminished breath sounds with decreased respiratory effort. GI: Soft/NT/ND NABS. MS: No C/C/C Neuro: Right facial droop.  Left strip normal, right side decreased urine.   Labs    High Sensitivity Troponin:   Recent Labs  Lab 08/30/2021 1806 09/19/2021 2244 09/21/21 0807  TROPONINIHS 25* 481* 7,052*     Chemistry Recent Labs  Lab 08/28/2021 1806 09/24/2021 1823 09/22/21 0133  09/19/2021 0036 08/28/2021 1039 09/07/2021 1312 09/24/21 0428 09/24/21 1515  NA 137   < > 140 142 146* 147* 145  --   K 3.6   < > 3.5 3.1* 3.7 3.9 3.5  --   CL 110   < > 117* 121*  --   --  118*  --   CO2 19*   < > 15* 14*  --   --  17*  --   GLUCOSE 331*   < > 198* 164*  --   --  152*  --   BUN 38*   < > 40* 42*  --   --  43*  --   CREATININE 2.18*   < > 1.97* 1.74*  --   --  2.05*  --   CALCIUM 8.4*   < > 8.3* 8.6*  --   --  8.2*  --   MG  --    < > 1.9 2.4  --   --  2.1 2.2  PROT 6.2*  --   --   --   --   --   --   --   ALBUMIN 3.4*  --   --   --   --   --   --   --   AST 35  --   --   --   --   --   --   --   ALT 36  --   --   --   --   --   --   --   ALKPHOS 90  --   --   --   --   --   --   --   BILITOT 0.7  --   --   --   --   --   --   --   GFRNONAA 29*   < > 32* 38*  --   --  31*  --   ANIONGAP 8   < > 8 7  --   --  10  --    < > = values in this interval not displayed.    Lipids  Recent Labs  Lab 09/21/21 0807  CHOL 147  TRIG 51  HDL 45  LDLCALC 92  CHOLHDL 3.3    Hematology Recent Labs  Lab 09/22/21 0133 09/15/2021 0036 09/02/2021 1039 09/14/2021 1312 09/24/21 0428  WBC 13.2* 13.6*  --   --  12.3*  RBC 3.25* 3.14*  --   --  3.02*  HGB 9.9* 9.6* 9.2* 9.5* 9.3*  HCT 29.6* 28.7* 27.0* 28.0* 27.4*  MCV 91.1 91.4  --   --  90.7  MCH 30.5 30.6  --   --  30.8  MCHC 33.4 33.4  --   --  33.9  RDW 14.4 14.7  --   --  14.8  PLT 155 167  --   --  157   Thyroid  Recent Labs  Lab 09/24/2021 2244  TSH 3.723  FREET4 0.78    BNPNo results for input(s): BNP, PROBNP in the last 168 hours.  DDimer No results for input(s): DDIMER in the last 168 hours.   Radiology    CT HEAD WO CONTRAST (5MM)  Result Date: 09/22/2021 CLINICAL DATA:  Stroke, follow-up. EXAM: CT HEAD WITHOUT CONTRAST TECHNIQUE: Contiguous axial images were obtained from the base of the skull through the vertex without intravenous contrast. COMPARISON:  Head CT from earlier same day at 5:58 a.m. FINDINGS:  Brain: Stable size and configuration of the acute parenchymal hematoma within the LEFT frontoparietal lobe, again measuring 4 x 2.9 cm. Surrounding parenchymal edema is grossly stable in extent. No evidence of a significant midline shift or herniation related to this hemorrhage and edema. There is generalized age related parenchymal volume loss with commensurate dilatation of the ventricles and sulci. No new parenchymal hemorrhage or edema. Vascular: Chronic calcified atherosclerotic changes of the large vessels at the skull base. No unexpected hyperdense vessel. Skull: Normal. Negative for fracture or focal lesion. Sinuses/Orbits: No acute finding. Other: None. IMPRESSION: 1. Stable size and configuration of the acute parenchymal hemorrhage within the LEFT frontoparietal lobe, again measuring 4 x 2.9 cm. Surrounding parenchymal edema is also grossly stable in extent. 2. No new parenchymal hemorrhage or edema. No midline shift or herniation. Electronically Signed   By: Franki Cabot M.D.   On: 09/19/2021 16:27   CT HEAD WO CONTRAST (5MM)  Result Date: 09/02/2021 CLINICAL DATA:  Hemorrhagic stroke follow-up EXAM: CT HEAD WITHOUT CONTRAST TECHNIQUE: Contiguous axial images were obtained from the base of the skull through the vertex without intravenous contrast. COMPARISON:  Yesterday FINDINGS: Brain: 29 x 40 mm hematoma the left frontal parietal junction with regional low-density swelling that is mildly progressed. Anteriorly is a low-density space that has filled in with blood. Still no midline shift of the atrophic brain. Small volume local subarachnoid hemorrhage. No hydrocephalus. Vascular: Atheromatous calcification. Skull: Negative for fracture.  Left parietal scalp swelling Sinuses/Orbits: Negative IMPRESSION: Mildly increased edema around the left cerebral hemorrhage which is not significantly increased in size. Electronically Signed   By: Jorje Guild M.D.   On: 09/10/2021 06:31   CARDIAC  CATHETERIZATION  Result Date: 09/22/2021 Bradycardia with cardiac arrest. Pacing wire repositioned into the RV His current heart rate is 130 sinus tachycardia. Pacing set at backup of 40 bpm.   CARDIAC CATHETERIZATION  Result Date: 08/28/2021 Bradycardia, cardiac arrest 2.   Successful placement temporary transvenous pacing wire from the left femoral vein.   DG Chest Port 1 View  Result Date: 09/24/2021 CLINICAL DATA:  Intubated.  Status post cardiac arrest. EXAM: PORTABLE CHEST 1 VIEW COMPARISON:  Yesterday. FINDINGS: Endotracheal tube in satisfactory position. Nasogastric tube side hole in the proximal stomach with the tip not included. Stable borderline enlarged cardiac silhouette. Increased left lower lobe atelectasis. Clear right lung. Thoracic spine degenerative changes. IMPRESSION: Increased left lower lobe atelectasis. Electronically Signed   By: Claudie Revering M.D.   On: 09/24/2021 09:03   DG Chest Port 1 View  Result Date: 08/29/2021 CLINICAL DATA:  ET/NG tube placement EXAM: PORTABLE CHEST 1 VIEW COMPARISON:  Chest x-ray dated September 20, 2021 FINDINGS: ET tube is unchanged in position. OG tube tip  is in the stomach with side port near the GE junction. Mild bilateral heterogeneous pulmonary opacities, similar to prior exam. No large pleural effusion or evidence of pneumothorax. Cardiac and mediastinal contours are unchanged. IMPRESSION: 1. OG tube tip is in the stomach with side port near the GE junction, consider advancement for optimal positioning. 2. Mild bilateral heterogeneous pulmonary opacities, likely a combination of atelectasis and pulmonary edema. Radiology ilia speaking Electronically Signed   By: Yetta Glassman M.D.   On: 08/29/2021 09:07   DG Abd Portable 1V  Result Date: 09/24/2021 CLINICAL DATA:  Feeding tube placement EXAM: PORTABLE ABDOMEN - 1 VIEW COMPARISON:  None. FINDINGS: Feeding tube tip projects over the expected area of the stomach. Retained intraluminal  contrast noted in the colon. No gas-filled dilated loops of bowel seen in the partially visualized abdomen and pelvis. Right femoral line. Left femoral vein approach trans venous pacing wire partially visualized. IMPRESSION: Feeding tube tip projects over the expected area of the stomach. Electronically Signed   By: Yetta Glassman M.D.   On: 09/24/2021 12:10   VAS US CAROTID  Result Date: 08/31/2021 Carotid Arterial Duplex Study Patient Name:  Aaron Carlson  Date of Exam:   09/10/2021 Medical Rec #: [id number]        Accession #:    [id number] Date of Birth: [birth year]        Patient Gender: M Patient Age:   34 years Exam Location:  [facility] Procedure:      VAS US CAROTID Referring Phys: Cornelius Moras XU --------------------------------------------------------------------------------  Indications:      CVA. Risk Factors:     Hypertension, hyperlipidemia, Diabetes, coronary artery                   disease. Comparison Study: No prior study Performing Technologist: Maudry Mayhew MHA, RDMS, RVT, RDCS  Examination Guidelines: A complete evaluation includes B-mode imaging, spectral Doppler, color Doppler, and power Doppler as needed of all accessible portions of each vessel. Bilateral testing is considered an integral part of a complete examination. Limited examinations for reoccurring indications may be performed as noted.  Right Carotid Findings: +----------+--------+--------+--------+-----------------------+--------+             PSV cm/s EDV cm/s Stenosis Plaque Description      Comments  +----------+--------+--------+--------+-----------------------+--------+  CCA Prox   60       14                                                  +----------+--------+--------+--------+-----------------------+--------+  CCA Distal 47       13                                                  +----------+--------+--------+--------+-----------------------+--------+  ICA Prox   39       14                smooth and  heterogenous           +----------+--------+--------+--------+-----------------------+--------+  ICA Distal 60       21                                                  +----------+--------+--------+--------+-----------------------+--------+  ECA        62       6                 smooth and heterogenous           +----------+--------+--------+--------+-----------------------+--------+ +----------+--------+-------+----------------+-------------------+             PSV cm/s EDV cms Describe         Arm Pressure (mmHG)  +----------+--------+-------+----------------+-------------------+  Subclavian 57               Multiphasic, WNL                      +----------+--------+-------+----------------+-------------------+ +---------+--------+--+--------+--+---------+  Vertebral PSV cm/s 57 EDV cm/s 12 Antegrade  +---------+--------+--+--------+--+---------+  Left Carotid Findings: +----------+-------+--------+--------+-----------------------+-----------------+             PSV     EDV cm/s Stenosis Plaque Description      Comments                       cm/s                                                                 +----------+-------+--------+--------+-----------------------+-----------------+  CCA Prox   69      18                                                           +----------+-------+--------+--------+-----------------------+-----------------+  CCA Distal 69      21                                        intimal                                                                          thickening         +----------+-------+--------+--------+-----------------------+-----------------+  ICA Prox   47      13                heterogenous and                                                                 calcific                                   +----------+-------+--------+--------+-----------------------+-----------------+  ICA Distal 91      40                                                            +----------+-------+--------+--------+-----------------------+-----------------+  ECA        41      5                 heterogenous and                                                                 calcific                                   +----------+-------+--------+--------+-----------------------+-----------------+ +----------+--------+--------+----------------+-------------------+             PSV cm/s EDV cm/s Describe         Arm Pressure (mmHG)  +----------+--------+--------+----------------+-------------------+  Subclavian 60                Multiphasic, WNL                      +----------+--------+--------+----------------+-------------------+ +---------+--------+--+--------+--+---------+  Vertebral PSV cm/s 69 EDV cm/s 21 Antegrade  +---------+--------+--+--------+--+---------+   Summary: Right Carotid: Velocities in the right ICA are consistent with a 1-39% stenosis. Left Carotid: Velocities in the left ICA are consistent with a 1-39% stenosis. Vertebrals:  Bilateral vertebral arteries demonstrate antegrade flow. Subclavians: Normal flow hemodynamics were seen in bilateral subclavian              arteries. *See table(s) above for measurements and observations.     Preliminary     Cardiac Studies   TTE 09/21/2021: EF 50 to 55%.  Low normal function.  Basal-mid inferior and inferolateral hypokinesis.  GR 1 DD.  Normal RV size and function normal PAP.  Mild LA dilation.  Moderate aortic valve calcification/sclerosis but no stenosis.  Normal RAP. TPM placed RFV - 09/24/2027 (for > 10 Sec V Pause CHB)   Assessment & Plan    1.  Transient 3rd AV Block over weekend - underlying RBBB/LAFB. ->  Now with prolonged sinus pause/asystole/complete heart block This occurred despite beta-blocker being held. => TPM placed, however remains sinus tachycardia since procedure Electrophysiology following, there is thought that potentially this could be related to hyper vagotonia in setting of stroke.  Need to  review the timeline, because I think that the original episode occurred before the stroke.  This would indicate that it may have been partly responsible for the stroke. Defer to EP, but suspected with 2 prolonged episodes of high-grade AV block with prolonged pauses leading to CPR that perhaps this would be indication for PPM. This is complicated by his intracranial hemorrhage.   As he is clearing up mentally, need to decide plus or minus permanent pacemaker versus switching from femoral access transvenous pacing to IJ temporary to allow for mobility.  2.  Known CAD with troponin elevation to 7052; unsure if this is a clinically ACS versus troponin elevation from neurologic injury.   Initially on heparin, discontinued due to [place].  Thankfully, EF is stable but does have regional wall motion normality.  As such, with no active angina symptoms, with probably avoid ischemic evaluation at this time since we would not build to perform PCI regardless until [place] cleared. 3.  Chronic diastolic heart failure: Euvolemic on exam.  Starting to get enteral nutrition today. 4.  Hyperlipidemia: Continue atorvastatin.  5.  Intracranial hemorrhage:  Followed by neurology.   Pending MRI once stabilized.   Any potential invasive evaluation cardiac standpoint until ICH is cleared.  Would not build to give heparin or antiplatelet until improved p by neurology.   For questions or updates, please contact [place] Please consult [email or web address] for contact info under      CRITICAL CARE  The patient is critically ill with multiple organ systems failure and requires high complexity decision making for assessment and support, frequent evaluation and titration of therapies, application of advanced monitoring technologies and extensive interpretation of multiple databases.    Critical Care Time devoted to patient care services described in this note is  40 Minutes.   Performed QA:ESLPN [place] care  time was exclusive of separately billable procedures and treating other patients.  Critical care was necessary to treat or prevent imminent or life-threatening deterioration.  Critical care was time spent personally by me on the following activities: development of treatment plan with patient and/or surrogate as well as nursing, discussions with consultants, evaluation of patient's response to treatment, examination of patient, obtaining history from patient or surrogate, ordering and performing treatments and interventions, ordering and review of laboratory studies, ordering and review of radiographic studies, pulse oximetry and re-evaluation of patient's condition.  This time reflects time of care of this signee Glenetta Hew, MD)  This critical care time does not reflect procedure time, or teaching time or supervisory time of PA/NP/Med student/Med Resident etc but could involve care discussion time with the patient, family & RN staff.          Signed, Glenetta Hew, MD  09/24/2021, 10:39 PM

## 2021-09-24 NOTE — Procedures (Signed)
Extubation Procedure Note  Patient Details:   Name: Aaron Carlson DOB: 06/20/1933 MRN: [id number]   Airway Documentation:  Airway 8 mm (Active)  Secured at (cm) 21 cm 09/24/21 0727  Measured From Lips 09/24/21 [place] 09/24/21 0727  Secured By [employer] 09/24/21 0727  Tube Holder Repositioned Yes 09/24/21 0512  Prone position No 09/24/21 0512  Cuff Pressure (cm H2O) Green OR 18-26 [facility] 09/24/21 0727  Site Condition Cool;Dry 09/24/21 0727   Vent end date: 09/24/21 Vent end time: 0930   Evaluation  O2 sats: stable throughout Complications: No apparent complications Patient did tolerate procedure well. Bilateral Breath Sounds: Clear, Diminished   Yes, pt could speak post extubation.  Pt extubated to 4 l/m .  Earney Navy 09/24/2021, 9:30 AM

## 2021-09-24 NOTE — Progress Notes (Signed)
STROKE TEAM PROGRESS NOTE   INTERVAL HISTORY His granddaughter and RN are at the bedside. Pt extubated today and now AAOx 3, but still has right arm mild weakness but significant dysarthria. On temporary pacemaker, still has tachycardia but stable from cardiology standpoint. Neuro unchanged from before intubation except slight worsening dysarthria.    Vitals:   09/24/21 1730 09/24/21 1745 09/24/21 1800 09/24/21 1815  BP:      Pulse: (!) 121 (!) 121 (!) 121 (!) 118  Resp: (!) 42 19 17 16   Temp:      TempSrc:      SpO2: 96% 97% 96% 97%  Weight:      Height:       CBC:  Recent Labs  Lab 09/08/2021 1806 09/15/2021 1823 09/12/2021 0036 09/07/2021 1039 09/18/2021 1312 09/24/21 0428  WBC 14.6*   < > 13.6*  --   --  12.3*  NEUTROABS 8.2*  --   --   --   --   --   HGB 10.6*   < > 9.6*   < > 9.5* 9.3*  HCT 32.4*   < > 28.7*   < > 28.0* 27.4*  MCV 92.6   < > 91.4  --   --  90.7  PLT 198   < > 167  --   --  157   < > = values in this interval not displayed.   Basic Metabolic Panel:  Recent Labs  Lab 09/25/2021 0036 09/11/2021 1039 09/13/2021 1312 09/24/21 0428 09/24/21 1515  NA 142   < > 147* 145  --   K 3.1*   < > 3.9 3.5  --   CL 121*  --   --  118*  --   CO2 14*  --   --  17*  --   GLUCOSE 164*  --   --  152*  --   BUN 42*  --   --  43*  --   CREATININE 1.74*  --   --  2.05*  --   CALCIUM 8.6*  --   --  8.2*  --   MG 2.4  --   --  2.1 2.2  PHOS 3.2  --   --  1.4* 4.2   < > = values in this interval not displayed.   Lipid Panel:  Recent Labs  Lab 09/21/21 0807  CHOL 147  TRIG 51  HDL 45  CHOLHDL 3.3  VLDL 10  LDLCALC 92   HgbA1c:  Recent Labs  Lab 09/16/2021 2244  HGBA1C 8.3*   Urine Drug Screen: No results for input(s): LABOPIA, COCAINSCRNUR, LABBENZ, AMPHETMU, THCU, LABBARB in the last 168 hours.  Alcohol Level No results for input(s): ETH in the last 168 hours.  IMAGING past 24 hours DG Chest Port 1 View  Result Date: 09/24/2021 CLINICAL DATA:  Intubated.  Status  post cardiac arrest. EXAM: PORTABLE CHEST 1 VIEW COMPARISON:  Yesterday. FINDINGS: Endotracheal tube in satisfactory position. Nasogastric tube side hole in the proximal stomach with the tip not included. Stable borderline enlarged cardiac silhouette. Increased left lower lobe atelectasis. Clear right lung. Thoracic spine degenerative changes. IMPRESSION: Increased left lower lobe atelectasis. Electronically Signed   By: Claudie Revering M.D.   On: 09/24/2021 09:03   DG Abd Portable 1V  Result Date: 09/24/2021 CLINICAL DATA:  Feeding tube placement EXAM: PORTABLE ABDOMEN - 1 VIEW COMPARISON:  None. FINDINGS: Feeding tube tip projects over the expected area of the stomach. Retained intraluminal contrast  noted in the colon. No gas-filled dilated loops of bowel seen in the partially visualized abdomen and pelvis. Right femoral line. Left femoral vein approach trans venous pacing wire partially visualized. IMPRESSION: Feeding tube tip projects over the expected area of the stomach. Electronically Signed   By: Yetta Glassman M.D.   On: 09/24/2021 12:10    PHYSICAL EXAM  Temp:  [97.9 F (36.6 C)-99.7 F (37.6 C)] 99.7 F (37.6 C) (12/28 1600) Pulse Rate:  [72-123] 118 (12/28 1815) Resp:  [0-42] 16 (12/28 1815) BP: (94-148)/(44-60) 148/54 (12/28 0512) SpO2:  [93 %-100 %] 97 % (12/28 1815) FiO2 (%):  [36 %-50 %] 36 % (12/28 0929) Weight:  [90.6 kg] 90.6 kg (12/28 0500)  General - Intubated, elderly male Ophthalmologic - fundi not visualized due to noncooperation. Cardiovascular - tachycardic  Neuro - awake, alert, eyes open, orientated to age, place, time and people. No aphasia, but severe dysarthria, following all simple commands. Able to name and repeat with dysarthric voice. no significant gaze palsy, tracking bilaterally, visual field full, PERRL. Right facial droop. Tongue protrusion midline. LUE 5/5, no drift. RUE 3+/5 proximal and distally. Bilaterally LEs 4/5 proximal and 5/5 distally.  Sensation diminished on the right. left FTN intact, R FTN significant ataxia proportional to weakness, gait not tested.   ASSESSMENT/PLAN Mr. Aaron Carlson is a 87 y.o. male with history of CAD, combined systolic and diastolic heart failure, HTN, HLD, DM, CKD 3/4, and GERD presenting initially with syncope and cardiac arrest. He fell, hit head, and found to have bradycardia HR 30s with heart block.  Initial CT on 12/24 left scalp hematoma but no intracranial hemorrhage.  Patient then developed PEA arrest, had CPR for 3 minutes and ROSC.  A heparin gtt was initiated on 12/24 per ACS protocol. He was extubated on 12/25. He developed a facial droop, slurred speech, and RUE weakness on 12/26 and a code stroke was activated. Head CT showed a L MCA territory 19ml ICH with a small volume extension of subarachnoid blood. MRI shows stable hematoma in the left frontoparietal junction. Code blue was called for on 12/27 for a cardiac arrest.. Patient went asystolic, ROSC achieved and patient was taken to cath lab for a temporary pacemaker. He was reintubated and had an ART line placed.   ICH - Left frontal ICH with SAH likely secondary to head trauma in combination with heparin IV Code Stroke- Acute L side 58ml intra-axial and perirolandic hemorrhage, small volume extension of subarachnoid blood. No midline shift MRI  intraparenchymal hematoma at the left frontoparietal junction measuring 3x4.2cm CT repeat x 2 - stable hematoma size 2D Echo EF 50-55% LV low normal function. Grade I diastolic dysfunction.  CUS unremarkable LDL 92 HgbA1c 8.3 VTE prophylaxis - SCDs No antithrombotic prior to admission, now on No antithrombotic due to [place]. OK to consider antiplatelet once ICH absorbed or nearly absorbed on CT. Therapy recommendations:  pending Disposition:  pending  Syncope with bradycardia PEA Arrest and asystole cardia arrest Heparin infusion for ACS protocol.  Trop [phone] Reversed with protamine on  12/26 at 0439 in setting of [place] Asystolic cardiac arrest with intermittent VT- 12/27 Temporary pacemaker placed 12/27 Intubated->extubated Tachycardia, cardiology on board Currently no antithrombotics due to [place] and [place] OK to consider antiplatelet once ICH absorbed or nearly absorbed on CT.  Hypertension Home meds:  Metoprolol succinate 25mg , Lasix 80mg  Stable BP goal less than 160 Long-term BP goal normotensive PRN hydralazine 10mg  q4  Hyperlipidemia Home meds:  Atorvastatin  20mg  LDL 92, goal < 70 On lipitor 40 Continue statin on discharge Not SATURN candidate due to likely traumatic ICH  Diabetes type II Uncontrolled Home meds:  Humalog HgbA1c 8.3, goal < 7.0 CBGs SSI Close PCP follow up for better DM control.   Other Stroke Risk Factors Advanced Age >/= 49  Obesity, Body mass index is 30.37 kg/m., BMI >/= 30 associated with increased stroke risk, recommend weight loss, diet and exercise as appropriate  Coronary artery disease - Home Meds: Imdur 60mg  q24hr, Nitro PRN  Other Active Problems GERD Prostate cancer AKI - Cre 2.20->1.97->1.74->2.05 Leukocytosis WBC 12.7->13.2->13.6->12.3  Hospital day # 4  Neurology will sign off. Please call with questions. Pt will follow up with stroke clinic NP at [facility] in about 4 weeks. Thanks for the consult.   Rosalin Hawking, MD PhD Stroke Neurology 09/24/2021 6:34 PM    To contact Stroke Continuity provider, please refer to [email or web address] After hours, contact General Neurology

## 2021-09-24 NOTE — Progress Notes (Signed)
Removed IO access on Rt. [address] without complication. No bleeding at all. Informed patient's RN. HS [employer]

## 2021-09-24 NOTE — Progress Notes (Addendum)
Electrophysiology Rounding Note  Patient Name: Aaron Carlson Date of Encounter: 09/24/2021  Primary Cardiologist: Sinclair Grooms, MD Electrophysiologist: New to Dr. Quentin Ore   Subjective   Remains intubated and sedated. Moving extremities  Inpatient Medications    Scheduled Meds:   stroke: mapping our early stages of recovery book   Does not apply Once   acetaminophen  650 mg Oral Q4H   Or   acetaminophen (TYLENOL) oral liquid 160 mg/5 mL  650 mg Per Tube Q4H   Or   acetaminophen  650 mg Rectal Q4H   atorvastatin  40 mg Per Tube Daily   chlorhexidine gluconate (MEDLINE KIT)  15 mL Mouth Rinse BID   Chlorhexidine Gluconate Cloth  6 each Topical Daily   docusate  100 mg Per Tube BID   fentaNYL (SUBLIMAZE) injection  25 mcg Intravenous Once   fentaNYL (SUBLIMAZE) injection  25 mcg Intravenous Once   insulin aspart  0-15 Units Subcutaneous Q4H   lidocaine  1 patch Transdermal Q24H   mouth rinse  15 mL Mouth Rinse 10 times per day   pantoprazole sodium  40 mg Per Tube QHS   polyethylene glycol  17 g Per Tube Daily   senna-docusate  1 tablet Per Tube BID   sodium chloride flush  3 mL Intravenous Q12H   Continuous Infusions:  sodium chloride Stopped (09/21/21 1539)   sodium chloride Stopped (09/02/2021 0813)   sodium chloride     cefTRIAXone (ROCEPHIN)  IV Stopped (09/04/2021 0569)   epinephrine 5 mcg/min (09/09/2021 0931)   fentaNYL infusion INTRAVENOUS 50 mcg/hr (09/01/2021 1305)   norepinephrine (LEVOPHED) Adult infusion 7 mcg/min (09/24/21 0700)   promethazine (PHENERGAN) injection (IM or IVPB)     sodium phosphate  Dextrose 5% IVPB 30 mmol (09/24/21 0747)   PRN Meds: sodium chloride, acetaminophen **OR** acetaminophen (TYLENOL) oral liquid 160 mg/5 mL **OR** acetaminophen, etomidate, fentaNYL, gadobutrol, guaiFENesin, hydrALAZINE, midazolam, midazolam, ondansetron (ZOFRAN) IV, sodium chloride flush   Vital Signs    Vitals:   09/24/21 0630 09/24/21 0645 09/24/21 0700  09/24/21 0727  BP:      Pulse: 76 77 76   Resp: (!) $RemoveB'26 13 14 20  'DCPwEDUJ$ Temp: 99.1 F (37.3 C) 99.1 F (37.3 C) 98.4 F (36.9 C)   TempSrc:      SpO2: 100% 100% 100% 100%  Weight:      Height:        Intake/Output Summary (Last 24 hours) at 09/24/2021 0802 Last data filed at 09/24/2021 0700 Gross per 24 hour  Intake 1773.18 ml  Output 2150 ml  Net -376.82 ml   Filed Weights   09/21/2021 2200 08/28/2021 0600 09/24/21 0500  Weight: 91.9 kg 90.6 kg 90.6 kg    Physical Exam    GEN- The patient is intubated and sedated.  HEENT: +ETT in place.  Lungs- + mechanical breathing sounds.  Heart- Regular rate and rhythm,  GI- soft, non-tender, non-distended, bowel sounds present Extremities- no clubbing, cyanosis, or edema; DP/PT/radial pulses 2+ bilaterally MS- no significant deformity or atrophy Skin- warm and dry, no rash or lesion Neuro- Intubated and sedated.    Labs    CBC Recent Labs    09/02/2021 0036 09/12/2021 1039 09/20/2021 1312 09/24/21 0428  WBC 13.6*  --   --  12.3*  HGB 9.6*   < > 9.5* 9.3*  HCT 28.7*   < > 28.0* 27.4*  MCV 91.4  --   --  90.7  PLT 167  --   --  157   < > = values in this interval not displayed.   Basic Metabolic Panel Recent Labs    09/06/2021 0036 09/25/2021 1039 09/18/2021 1312 09/24/21 0428  NA 142   < > 147* 145  K 3.1*   < > 3.9 3.5  CL 121*  --   --  118*  CO2 14*  --   --  17*  GLUCOSE 164*  --   --  152*  BUN 42*  --   --  43*  CREATININE 1.74*  --   --  2.05*  CALCIUM 8.6*  --   --  8.2*  MG 2.4  --   --  2.1  PHOS 3.2  --   --  1.4*   < > = values in this interval not displayed.   Liver Function Tests No results for input(s): AST, ALT, ALKPHOS, BILITOT, PROT, ALBUMIN in the last 72 hours. No results for input(s): LIPASE, AMYLASE in the last 72 hours. Cardiac Enzymes No results for input(s): CKTOTAL, CKMB, CKMBINDEX, TROPONINI in the last 72 hours.   Telemetry    NSR 80s (personally reviewed)  Radiology    CT HEAD WO  CONTRAST (5MM)  Result Date: 09/27/2021 CLINICAL DATA:  Stroke, follow-up. EXAM: CT HEAD WITHOUT CONTRAST TECHNIQUE: Contiguous axial images were obtained from the base of the skull through the vertex without intravenous contrast. COMPARISON:  Head CT from earlier same day at 5:58 a.m. FINDINGS: Brain: Stable size and configuration of the acute parenchymal hematoma within the LEFT frontoparietal lobe, again measuring 4 x 2.9 cm. Surrounding parenchymal edema is grossly stable in extent. No evidence of a significant midline shift or herniation related to this hemorrhage and edema. There is generalized age related parenchymal volume loss with commensurate dilatation of the ventricles and sulci. No new parenchymal hemorrhage or edema. Vascular: Chronic calcified atherosclerotic changes of the large vessels at the skull base. No unexpected hyperdense vessel. Skull: Normal. Negative for fracture or focal lesion. Sinuses/Orbits: No acute finding. Other: None. IMPRESSION: 1. Stable size and configuration of the acute parenchymal hemorrhage within the LEFT frontoparietal lobe, again measuring 4 x 2.9 cm. Surrounding parenchymal edema is also grossly stable in extent. 2. No new parenchymal hemorrhage or edema. No midline shift or herniation. Electronically Signed   By: Franki Cabot M.D.   On: 09/06/2021 16:27   CT HEAD WO CONTRAST (5MM)  Result Date: 09/25/2021 CLINICAL DATA:  Hemorrhagic stroke follow-up EXAM: CT HEAD WITHOUT CONTRAST TECHNIQUE: Contiguous axial images were obtained from the base of the skull through the vertex without intravenous contrast. COMPARISON:  Yesterday FINDINGS: Brain: 29 x 40 mm hematoma the left frontal parietal junction with regional low-density swelling that is mildly progressed. Anteriorly is a low-density space that has filled in with blood. Still no midline shift of the atrophic brain. Small volume local subarachnoid hemorrhage. No hydrocephalus. Vascular: Atheromatous  calcification. Skull: Negative for fracture.  Left parietal scalp swelling Sinuses/Orbits: Negative IMPRESSION: Mildly increased edema around the left cerebral hemorrhage which is not significantly increased in size. Electronically Signed   By: Jorje Guild M.D.   On: 08/31/2021 06:31   MR BRAIN W WO CONTRAST  Result Date: 09/22/2021 CLINICAL DATA:  Stroke. Follow-up intracranial hemorrhage. Assess for underlying lesion. EXAM: MRI HEAD WITHOUT AND WITH CONTRAST TECHNIQUE: Multiplanar, multiecho pulse sequences of the brain and surrounding structures were obtained without and with intravenous contrast. CONTRAST:  27mL GADAVIST GADOBUTROL 1 MMOL/ML IV SOLN COMPARISON:  09/19/2021.  CT earlier  same day. FINDINGS: Brain: The study suffers from considerable motion degradation. No focal abnormality is seen affecting the brainstem or cerebellum. Right cerebral hemisphere shows mild chronic small-vessel ischemic changes of the white matter. Left cerebral hemisphere shows a 4.2 x 3 cm in diameter hematoma at the left frontoparietal junction with surrounding edema. After contrast administration, there are a few punctate foci of contrast enhancement or accumulation along the posterior wall. The possibility of ongoing bleeding does exist, and it may be prudent to follow this with an additional CT in a few hours to make sure the hematoma is not enlarging further. There is no clear underlying mass lesion, but that is not completely excluded and should be evaluated subsequently. There is no midline shift. No hydrocephalus. No extra-axial collection. Vascular: Major vessels at the base of the brain show flow. Skull and upper cervical spine: Negative Sinuses/Orbits: Clear/normal Other: None IMPRESSION: Considerable motion degradation. Redemonstration of an intraparenchymal hematoma at the left frontoparietal junction measuring approximately 3 x 4.2 cm as seen previously. Surrounding edema. Question of punctate enhancement or  contrast accumulation along the posterior wall. This could represent slow ongoing bleeding, and I would suggest repeat CT evaluation in several hours to make sure that the hematoma is not enlarging further. At this point, we have not diagnose an underlying mass lesion, but the patient should be restudied after recovery from the acute phase. Electronically Signed   By: Nelson Chimes M.D.   On: 09/22/2021 11:57   CARDIAC CATHETERIZATION  Result Date: 09/25/2021 Bradycardia with cardiac arrest. Pacing wire repositioned into the RV His current heart rate is 130 sinus tachycardia. Pacing set at backup of 40 bpm.   CARDIAC CATHETERIZATION  Result Date: 09/21/2021 Bradycardia, cardiac arrest 2.   Successful placement temporary transvenous pacing wire from the left femoral vein.   DG Chest Port 1 View  Result Date: 09/11/2021 CLINICAL DATA:  ET/NG tube placement EXAM: PORTABLE CHEST 1 VIEW COMPARISON:  Chest x-ray dated September 20, 2021 FINDINGS: ET tube is unchanged in position. OG tube tip is in the stomach with side port near the GE junction. Mild bilateral heterogeneous pulmonary opacities, similar to prior exam. No large pleural effusion or evidence of pneumothorax. Cardiac and mediastinal contours are unchanged. IMPRESSION: 1. OG tube tip is in the stomach with side port near the GE junction, consider advancement for optimal positioning. 2. Mild bilateral heterogeneous pulmonary opacities, likely a combination of atelectasis and pulmonary edema. Radiology ilia speaking Electronically Signed   By: Yetta Glassman M.D.   On: 09/17/2021 09:07   DG Swallowing Func-Speech Pathology  Result Date: 09/22/2021 Table formatting from the original result was not included. Objective Swallowing Evaluation: Type of Study: MBS-Modified Barium Swallow Study  Patient Details Name: Aaron Carlson MRN: [id number] Date of Birth: 12/01/1932 Today's Date: 09/22/2021 Time: SLP Start Time (ACUTE ONLY): 1458 -SLP Stop Time  (ACUTE ONLY): 1516 SLP Time Calculation (min) (ACUTE ONLY): 18 min Past Medical History: Past Medical History: Diagnosis Date  CAD in native artery 08/28/2013  Heaviness in chest with exertion. Coronary angiography 2005 demonstrated severe distal LAD and diagonal disease, severe distal RCA and PL OM disease, and severe first obtuse marginal disease. No high-grade proximal coronary disease was present at the time.  The most recent myocardial perfusion study in 2014 was nonischemic/low risk    Chronic renal insufficiency   Colon polyps   adenomatous  Diabetes mellitus   Hyperlipemia   Hypertension   Laryngeal papillomatosis   Prostate cancer ([place])  w/ mets to bladder Past Surgical History: Past Surgical History: Procedure Laterality Date  BLADDER SURGERY    CATARACT EXTRACTION Left 02/2020  CIRCUMCISION  2011  POLYPECTOMY    vocal cords  PROSTATE SURGERY   HPI: Pt is an 87 yo male presenting with syncope and found unresponsive. Pt went into PEA arrest requiring CPR x3 minutes with ROSC. ETT 12/24-12/25. Pt initially passed a yale swallow screen post-extubation but then a code stroke was called in the early morning on 12/26 due to R sided weakness and slurred speech. CTH on admission negative for acute changes but repeat on 12/26 revealed acute L sided ICH. MRI with considerable motion degradation but redmonstrates intraparenchymal hematoma at the L frontoparietal junction with surrounding edema. PMH significant for recurrent laryngeal papillomatosis s/p laser excision, CAD, DM2, HTN, HLD, Prostrate Cancer  Subjective: lethargic  Recommendations for follow up therapy are one component of a multi-disciplinary discharge planning process, led by the attending physician.  Recommendations may be updated based on patient status, additional functional criteria and insurance authorization. Assessment / Plan / Recommendation Clinical Impressions 09/22/2021 Clinical Impression Pt has a moderate oropharyngeal dysphagia due to  sensory and motor deficits, perhaps further exacerbated during this study given pt lethargy. He has weak labial seal on the R that allows for anterior loss, as well as reduced lingual propulsion and reduced coordination for posterior transit. Oral residue remains, especially on his R side and in his anterior sulcus. His bolus cohesion is reduced and his timing is inconsistent for swallow trigger. Most consistencies initiate a swallow at the pyriform sinuses at least intermittently, although the thinner the consistency, the more consistently it reaches the pyriform sinuses before the swallow. Thin and nectar thick liquids more consistently enter the airway before the swallow in small quantities, but aspiration is silent and his cued cough is too weak to clear his airway. Pt can contain honey thick liquids and purees in his valleculae at times, but deep penetration to the vocal folds occurs when his timing is off. There is also more residue in the valleculae and posterior pharyngeal wall due to reduced base of tongue retraction and pharyngeal squeeze. A chin tuck helps to improve airway protection and slightly reduce residue with purees. It helps with honey thick liquids as well, but he can't get boluses out of a straw and he can't coordinate a chin tuck with cup sips. A spoon had to be used. Recommend starting with Dys 1 (puree) diet and honey thick liquids by spoon with full supervision for use of a chin tuck. Anticipate that it would be hard for him to meet his nutritional needs with such restrictions and with current lethargy, so may want to consider temporary alternative means of nutrition to supplement PO diet. Will f/u for ongoing dysphagia management. SLP Visit Diagnosis Dysphagia, oropharyngeal phase (R13.12) Attention and concentration deficit following -- Frontal lobe and executive function deficit following -- Impact on safety and function Moderate aspiration risk;Risk for inadequate nutrition/hydration    Treatment Recommendations 09/22/2021 Treatment Recommendations Therapy as outlined in treatment plan below   Prognosis 09/22/2021 Prognosis for Safe Diet Advancement Good Barriers to Reach Goals -- Barriers/Prognosis Comment -- Diet Recommendations 09/22/2021 SLP Diet Recommendations Dysphagia 1 (Puree) solids;Honey thick liquids Liquid Administration via Spoon Medication Administration Crushed with puree Compensations Minimize environmental distractions;Slow rate;Small sips/bites;Lingual sweep for clearance of pocketing;Monitor for anterior loss;Chin tuck Postural Changes Seated upright at 90 degrees;Remain semi-upright after after feeds/meals (Comment)   Other Recommendations 09/22/2021 Recommended Consults --  Oral Care Recommendations Oral care BID Other Recommendations Order thickener from pharmacy;Prohibited food (jello, ice cream, thin soups);Remove water pitcher;Have oral suction available Follow Up Recommendations Acute inpatient rehab (3hours/day) Assistance recommended at discharge Intermittent Supervision/Assistance Functional Status Assessment Patient has had a recent decline in their functional status and demonstrates the ability to make significant improvements in function in a reasonable and predictable amount of time. Frequency and Duration  09/22/2021 Speech Therapy Frequency (ACUTE ONLY) min 2x/week Treatment Duration 2 weeks   Oral Phase 09/22/2021 Oral Phase Impaired Oral - Pudding Teaspoon -- Oral - Pudding Cup -- Oral - Honey Teaspoon Decreased bolus cohesion;Lingual/palatal residue;Reduced posterior propulsion;Right anterior bolus loss Oral - Honey Cup Decreased bolus cohesion;Lingual/palatal residue;Reduced posterior propulsion;Right anterior bolus loss Oral - Nectar Teaspoon -- Oral - Nectar Cup Decreased bolus cohesion;Lingual/palatal residue;Reduced posterior propulsion;Right anterior bolus loss Oral - Nectar Straw Decreased bolus cohesion;Lingual/palatal residue;Reduced posterior  propulsion Oral - Thin Teaspoon -- Oral - Thin Cup Decreased bolus cohesion;Lingual/palatal residue;Reduced posterior propulsion;Right anterior bolus loss Oral - Thin Straw -- Oral - Puree Decreased bolus cohesion;Lingual/palatal residue;Reduced posterior propulsion;Right anterior bolus loss Oral - Mech Soft -- Oral - Regular -- Oral - Multi-Consistency -- Oral - Pill -- Oral Phase - Comment --  Pharyngeal Phase 09/22/2021 Pharyngeal Phase Impaired Pharyngeal- Pudding Teaspoon -- Pharyngeal -- Pharyngeal- Pudding Cup -- Pharyngeal -- Pharyngeal- Honey Teaspoon Reduced pharyngeal peristalsis;Reduced tongue base retraction;Pharyngeal residue - valleculae;Delayed swallow initiation-pyriform sinuses;Compensatory strategies attempted (with notebox) Pharyngeal -- Pharyngeal- Honey Cup Reduced pharyngeal peristalsis;Reduced tongue base retraction;Penetration/Apiration after swallow;Pharyngeal residue - valleculae;Delayed swallow initiation-pyriform sinuses Pharyngeal Material enters airway, CONTACTS cords and not ejected out Pharyngeal- Nectar Teaspoon -- Pharyngeal -- Pharyngeal- Nectar Cup Delayed swallow initiation-pyriform sinuses;Penetration/Aspiration before swallow;Reduced tongue base retraction;Pharyngeal residue - valleculae Pharyngeal Material enters airway, CONTACTS cords and not ejected out Pharyngeal- Nectar Straw Delayed swallow initiation-pyriform sinuses;Penetration/Aspiration before swallow;Reduced tongue base retraction;Pharyngeal residue - valleculae Pharyngeal Material enters airway, passes BELOW cords without attempt by patient to eject out (silent aspiration) Pharyngeal- Thin Teaspoon -- Pharyngeal -- Pharyngeal- Thin Cup Delayed swallow initiation-pyriform sinuses;Penetration/Aspiration before swallow;Reduced tongue base retraction Pharyngeal Material enters airway, passes BELOW cords without attempt by patient to eject out (silent aspiration) Pharyngeal- Thin Straw -- Pharyngeal -- Pharyngeal- Puree  Reduced pharyngeal peristalsis;Reduced tongue base retraction;Penetration/Apiration after swallow;Pharyngeal residue - valleculae;Delayed swallow initiation-pyriform sinuses;Penetration/Aspiration during swallow;Reduced airway/laryngeal closure Pharyngeal Material enters airway, CONTACTS cords and not ejected out Pharyngeal- Mechanical Soft -- Pharyngeal -- Pharyngeal- Regular -- Pharyngeal -- Pharyngeal- Multi-consistency -- Pharyngeal -- Pharyngeal- Pill -- Pharyngeal -- Pharyngeal Comment --  Cervical Esophageal Phase  09/22/2021 Cervical Esophageal Phase WFL Pudding Teaspoon -- Pudding Cup -- Honey Teaspoon -- Honey Cup -- Nectar Teaspoon -- Nectar Cup -- Nectar Straw -- Thin Teaspoon -- Thin Cup -- Thin Straw -- Puree -- Mechanical Soft -- Regular -- Multi-consistency -- Pill -- Cervical Esophageal Comment -- Aaron Bond., M.A. CCC-SLP Acute Rehabilitation Services Pager [phone] Office [phone] 09/22/2021, 4:29 PM                     VAS US CAROTID  Result Date: 08/31/2021 Carotid Arterial Duplex Study Patient Name:  Aaron Carlson  Date of Exam:   09/22/2021 Medical Rec #: [id number]        Accession #:    [id number] Date of Birth: [birth year]        Patient Gender: M Patient Age:   36 years Exam Location:  [facility] Procedure:      VAS  US CAROTID Referring Phys: Cornelius Moras XU --------------------------------------------------------------------------------  Indications:      CVA. Risk Factors:     Hypertension, hyperlipidemia, Diabetes, coronary artery                   disease. Comparison Study: No prior study Performing Technologist: Maudry Mayhew MHA, RDMS, RVT, RDCS  Examination Guidelines: A complete evaluation includes B-mode imaging, spectral Doppler, color Doppler, and power Doppler as needed of all accessible portions of each vessel. Bilateral testing is considered an integral part of a complete examination. Limited examinations for reoccurring indications may be performed as  noted.  Right Carotid Findings: +----------+--------+--------+--------+-----------------------+--------+             PSV cm/s EDV cm/s Stenosis Plaque Description      Comments  +----------+--------+--------+--------+-----------------------+--------+  CCA Prox   60       14                                                  +----------+--------+--------+--------+-----------------------+--------+  CCA Distal 47       13                                                  +----------+--------+--------+--------+-----------------------+--------+  ICA Prox   39       14                smooth and heterogenous           +----------+--------+--------+--------+-----------------------+--------+  ICA Distal 60       21                                                  +----------+--------+--------+--------+-----------------------+--------+  ECA        62       6                 smooth and heterogenous           +----------+--------+--------+--------+-----------------------+--------+ +----------+--------+-------+----------------+-------------------+             PSV cm/s EDV cms Describe         Arm Pressure (mmHG)  +----------+--------+-------+----------------+-------------------+  Subclavian 57               Multiphasic, WNL                      +----------+--------+-------+----------------+-------------------+ +---------+--------+--+--------+--+---------+  Vertebral PSV cm/s 57 EDV cm/s 12 Antegrade  +---------+--------+--+--------+--+---------+  Left Carotid Findings: +----------+-------+--------+--------+-----------------------+-----------------+             PSV     EDV cm/s Stenosis Plaque Description      Comments                       cm/s                                                                 +----------+-------+--------+--------+-----------------------+-----------------+  CCA Prox   69      18                                                            +----------+-------+--------+--------+-----------------------+-----------------+  CCA Distal 69      21                                        intimal                                                                          thickening         +----------+-------+--------+--------+-----------------------+-----------------+  ICA Prox   47      13                heterogenous and                                                                 calcific                                   +----------+-------+--------+--------+-----------------------+-----------------+  ICA Distal 91      40                                                           +----------+-------+--------+--------+-----------------------+-----------------+  ECA        41      5                 heterogenous and                                                                 calcific                                   +----------+-------+--------+--------+-----------------------+-----------------+ +----------+--------+--------+----------------+-------------------+             PSV cm/s EDV cm/s Describe         Arm Pressure (mmHG)  +----------+--------+--------+----------------+-------------------+  Subclavian 60                Multiphasic, WNL                      +----------+--------+--------+----------------+-------------------+ +---------+--------+--+--------+--+---------+  Vertebral PSV cm/s 69 EDV cm/s 21 Antegrade  +---------+--------+--+--------+--+---------+   Summary: Right Carotid: Velocities in the right ICA are consistent with a 1-39% stenosis. Left Carotid: Velocities in the left ICA are consistent with a 1-39% stenosis. Vertebrals:  Bilateral vertebral arteries demonstrate antegrade flow. Subclavians: Normal flow hemodynamics were seen in bilateral subclavian              arteries. *See table(s) above for measurements and observations.     Preliminary     Patient Profile     Aaron Carlson is a 87 y.o. male with a history of CAD,  combined systolic and diastolic HF, HTN, HLD, DM, CKD 3/4, and GERD who is being seen today for the evaluation of asystole and CHB at the request of Dr. Ellyn Hack.  Assessment & Plan    Transient complete heart block Suspect secondary to hypervagotonia in the setting of acute stroke Now with stable rhythm and with stable temp wire.  Will follow for improvement  2. ?CAD HS trop on admission elevated at 7052. Pt was not complaining of chest pain. ? If due to episode of HB with CPR LHC will likely be deferred this admission in setting of intracranial hemorrhage.  Complete Echo as below with normal EF and no WMA   3. Chronic diastolic heart failure: Echo 09/21/2021 LVEF 50-55% with no regional wall abnormalities.   4. Hypokalemia K 3.1 -> 3.5  For questions or updates, please contact [place] Please consult [email or web address] for contact info under [email or web address]  Signed, Shirley Friar, PA-C  09/24/2021, 8:02 AM

## 2021-09-24 NOTE — ED Provider Notes (Signed)
[facility] Provider Note   CSN: [id number] Arrival date & time: 09/27/2021  1801     History Chief Complaint  Patient presents with   Cardiac Arrest    Aaron Carlson is a 87 y.o. male with past medical history significant for CAD, combined systolic and diastolic heart failure, hypertension, hyperlipidemia, diabetes, chronic kidney disease and GERD who presents via EMS for chief complaint of a syncopal episode.  Per EMS, patient was found to be in either high degree second-degree or third-degree heart block upon arrival and they put the pads on to begin pacing.  Patient was confused and ripped off the pacing pad sending him into cardiac arrest.  He underwent 3 episodes of CPR and 1 round of epi.  He continued to alternate from sinus bradycardia to sinus tach.  Patient was intubated upon arrival to the ED.  Patient's wife and other family member arrived to the ED after patient was stabilized, and she states that she was taking a [place] out of the oven.  Patient commented on how well the [place] looked and then suddenly fell over backwards hitting his head onto the floor.  EMS was promptly called.   Cardiac Arrest     Past Medical History:  Diagnosis Date   CAD in native artery 08/28/2013   Heaviness in chest with exertion. Coronary angiography 2005 demonstrated severe distal LAD and diagonal disease, severe distal RCA and PL OM disease, and severe first obtuse marginal disease. No high-grade proximal coronary disease was present at the time.  The most recent myocardial perfusion study in 2014 was nonischemic/low risk     Chronic renal insufficiency    Colon polyps    adenomatous   Diabetes mellitus    Hyperlipemia    Hypertension    Laryngeal papillomatosis    Prostate cancer ([place])    w/ mets to bladder    Patient Active Problem List   Diagnosis Date Noted   Cardiac asystole ([place]) 09/10/2021   Intracerebral hemorrhage 09/22/2021   Tachy-brady syndrome  ([place])    Cardiac arrest ([place]) 08/30/2021   Third degree heart block ([place])    Acute respiratory failure with hypoxia ([place])    Posterior subcapsular age-related cataract, right eye 09/03/2021   Abdominal aortic aneurysm without rupture 08/12/2021   Pure hypercholesterolemia 08/12/2021   Cystoid macular edema, left eye 12/04/2020   Vitreomacular traction syndrome of left eye 12/04/2020   Primary open angle glaucoma of left eye, severe stage 12/04/2020   Diabetes mellitus without complication ([place]) 08/67/6195   Left epiretinal membrane 12/04/2020   Serous retinal detachment, left eye 12/04/2020   Anemia 11/29/2020   Diabetic renal disease ([place]) 11/29/2020   Glaucoma 11/29/2020   Hyperglycemia due to type 2 diabetes mellitus ([place]) 11/29/2020   History of malignant neoplasm of prostate 11/29/2020   Long term (current) use of insulin ([place]) 11/29/2020   Morbid obesity ([place]) 11/29/2020   Flatulence/gas pain/belching 12/12/2018   Gastritis 12/12/2018   Chronic diastolic heart failure ([place]) 09/09/2016   CKD (chronic kidney disease) stage 3, GFR 30-59 ml/min (H[facility]) 08/11/2016   RBBB 12/28/2013   Coronary artery disease involving native coronary artery of native heart with angina pectoris ([place]) 08/28/2013    Class: Chronic   RBBB (right bundle branch block with left anterior fascicular block) 08/28/2013   Hyperlipidemia 08/28/2013    Class: Chronic   Papilloma of larynx 08/26/2011   IDDM 02/01/2007   Essential hypertension 02/01/2007   RHINITIS, ALLERGIC NOS 02/01/2007  Past Surgical History:  Procedure Laterality Date   BLADDER SURGERY     CATARACT EXTRACTION Left 02/2020   CIRCUMCISION  2011   FLUOROSCOPY GUIDANCE N/A 09/20/2021   Procedure: FLUOROSCOPY GUIDANCE;  Surgeon: Burnell Blanks, MD;  Location: [facility];  Service: Cardiovascular;  Laterality: N/A;   POLYPECTOMY     vocal cords   PROSTATE SURGERY     TEMPORARY PACEMAKER N/A 09/07/2021   Procedure:  TEMPORARY PACEMAKER;  Surgeon: Burnell Blanks, MD;  Location: [facility];  Service: Cardiovascular;  Laterality: N/A;       Family History  Problem Relation Age of Onset   Breast cancer Mother    Diabetes Mother    Heart failure Father    Heart disease Father    Pancreatic cancer Brother    Colon cancer Neg Hx    Esophageal cancer Neg Hx    Liver disease Neg Hx     Social History   Tobacco Use   Smoking status: Former   Smokeless tobacco: Never  Scientific laboratory technician Use: Never used  Substance Use Topics   Alcohol use: Yes    Comment: social   Drug use: No    Home Medications Prior to Admission medications   Medication Sig Start Date End Date Taking? Authorizing Provider  atorvastatin (LIPITOR) 20 MG tablet Take 20 mg by mouth at bedtime.   Yes [provider]  benazepril (LOTENSIN) 20 MG tablet TAKE 1 TABLET EVERY DAY Patient taking differently: Take 20 mg by mouth daily. 07/25/21  Yes Belva Crome, MD  dorzolamide (TRUSOPT) 2 % ophthalmic solution Place 1 drop into both eyes 2 (two) times daily.   Yes [provider]  fluorometholone (FML) 0.1 % ophthalmic suspension Place 1 drop into the left eye 3 (three) times daily.   Yes [provider]  furosemide (LASIX) 80 MG tablet Take 80 mg by mouth daily.   Yes [provider]  isosorbide mononitrate (IMDUR) 60 MG 24 hr tablet Take 1 tablet (60 mg total) by mouth daily. 07/10/21  Yes Belva Crome, MD  ketorolac (ACULAR) 0.4 % SOLN Place 1 drop into the left eye 4 (four) times daily.   Yes [provider]  latanoprost (XALATAN) 0.005 % ophthalmic solution Place 1 drop into both eyes at bedtime.   Yes [provider]  metoprolol succinate (TOPROL XL) 25 MG 24 hr tablet Take 1 tablet (25 mg total) by mouth daily. 06/11/21  Yes Belva Crome, MD  nitroGLYCERIN (NITROSTAT) 0.4 MG SL tablet Place 1 tablet (0.4 mg total) under the tongue every 5 (five) minutes  as needed for chest pain. 12/09/20 10/16/21 Yes Belva Crome, MD  NON FORMULARY Take 1-2 capsules by mouth See admin instructions. Colon Clenz capsules- Take 1-2 capsules by mouth once a day as needed for constipation   Yes [provider]  NOVOLIN N 100 UNIT/ML injection Inject 38 Units into the skin at bedtime.   Yes [provider]  pantoprazole (PROTONIX) 40 MG tablet Take 40 mg by mouth daily as needed (for heartburn). 07/01/21  Yes [provider]  potassium chloride (KLOR-CON) 10 MEQ tablet Take 10 mEq by mouth daily.   Yes [provider]  prednisoLONE acetate (PRED FORTE) 1 % ophthalmic suspension Place 1 drop into both eyes at bedtime.   Yes [provider]  SIMBRINZA 1-0.2 % SUSP Place 1 drop into both eyes 2 (two) times daily.  Yes [provider]  simethicone (MYLICON) 024 MG chewable tablet Chew 125 mg by mouth every 6 (six) hours as needed for flatulence.   Yes [provider]  Accu-Chek Softclix Lancets lancets  05/25/20   [provider]  Alcohol Swabs 70 % PADS use when checking blood sugar 03/15/20   [provider]  BD INSULIN SYRINGE ULTRAFINE 31G X 5/16" 0.5 ML MISC Use as directed 10/10/13   [provider]  Blood Glucose Monitoring Suppl (TRUE METRIX METER) w/Device KIT  05/16/20   [provider]  brimonidine (ALPHAGAN) 0.2 % ophthalmic solution Place 1 drop into both eyes 2 (two) times daily. 07/29/21   [provider]  calcium carbonate (TUMS - DOSED IN MG ELEMENTAL CALCIUM) 500 MG chewable tablet Chew 1 tablet by mouth daily as needed for indigestion or heartburn.    [provider]  Continuous Blood Gluc Receiver (FREESTYLE LIBRE 2 READER) DEVI See admin instructions. 07/21/21   [provider]  Continuous Blood Gluc Sensor (FREESTYLE LIBRE 2 SENSOR) MISC apply to upper back of arm 07/21/21   [provider]  fluticasone (FLONASE) 50 MCG/ACT  nasal spray Place into both nostrils daily as needed for allergies or rhinitis.    [provider]  insulin lispro (HUMALOG) 100 UNIT/ML injection Use as directed daily at bedtime as directed per sliding scale.    [provider]  Iron Combinations (CHROMAGEN) capsule Take 1 capsule by mouth daily.    [provider]  Latanoprostene Bunod (VYZULTA) 0.024 % SOLN 1 drop into affected eye in the evening    [provider]  MAGNESIUM PO Take 1 tablet by mouth daily.    [provider]  Multiple Vitamin (MULTIVITAMIN) tablet Take 1 tablet by mouth daily.    [provider]  niacin (NIASPAN) 500 MG CR tablet Take 500 mg by mouth at bedtime.    [provider]  niacin (SLO-NIACIN) 500 MG tablet 1 tablet with food    [provider]  Omega-3 Fatty Acids (FISH OIL) 1000 MG CAPS 1 capsule    [provider]  ONE TOUCH ULTRA TEST test strip Use as directed 08/21/13   [provider]  Peppermint Oil (IBGARD) 90 MG CPCR 3-4 capsules    [provider]  Probiotic TBEC See admin instructions.    [provider]  Jay Schlichter Oil (ARTIFICIAL TEARS) ointment Place into the left eye in the morning, at noon, and at bedtime.    [provider]    Allergies    Penicillins  Review of Systems   Review of Systems  Unable to perform ROS: Patient unresponsive   Physical Exam Updated Vital Signs BP (!) 148/54    Pulse (!) 118    Temp 99.7 F (37.6 C) (Axillary)    Resp 16    Ht $R'5\' 8"'wU$  (1.727 m)    Wt 90.6 kg    SpO2 97%    BMI 30.37 kg/m   Physical Exam Vitals and nursing note reviewed.  Constitutional:      General: He is in acute distress.     Appearance: He is ill-appearing.     Comments: Patient is ill-appearing in acute distress.  He is nonresponsive upon arrival  HENT:     Head: Atraumatic.     Comments: Head without palpable deformity or bleeding Eyes:      Conjunctiva/sclera: Conjunctivae normal.  Cardiovascular:     Rate and Rhythm: Tachycardia present. Rhythm irregular.  Heart sounds: No murmur heard.    Comments: Patient first presents with third-degree heart block.  After epi and CPR alternating between sinus bradycardia and sinus tach.  Palpable pulses in femoral, radial and carotid.  No murmurs rubs or gallops. Pulmonary:     Effort: Pulmonary effort is normal. No respiratory distress.     Breath sounds: Normal breath sounds.  Abdominal:     General: Abdomen is flat. There is no distension.     Palpations: Abdomen is soft.  Musculoskeletal:     Cervical back: Neck supple.     Comments: Unable to assess given patient's acute cardiac condition  Skin:    General: Skin is dry.     Capillary Refill: Capillary refill takes less than 2 seconds.     Coloration: Skin is pale.  Neurological:     General: No focal deficit present.  Psychiatric:     Comments: Patient is unresponsive    ED Results / Procedures / Treatments   Labs (all labs ordered are listed, but only abnormal results are displayed) Labs Reviewed  CBC WITH DIFFERENTIAL/PLATELET - Abnormal; Notable for the following components:      Result Value   WBC 14.6 (*)    RBC 3.50 (*)    Hemoglobin 10.6 (*)    HCT 32.4 (*)    Neutro Abs 8.2 (*)    Lymphs Abs 5.0 (*)    Basophils Absolute 0.4 (*)    nRBC 1 (*)    All other components within normal limits  COMPREHENSIVE METABOLIC PANEL - Abnormal; Notable for the following components:   CO2 19 (*)    Glucose, Bld 331 (*)    BUN 38 (*)    Creatinine, Ser 2.18 (*)    Calcium 8.4 (*)    Total Protein 6.2 (*)    Albumin 3.4 (*)    GFR, Estimated 29 (*)    All other components within normal limits  HEMOGLOBIN A1C - Abnormal; Notable for the following components:   Hgb A1c MFr Bld 8.3 (*)    All other components within normal limits  CBC - Abnormal; Notable for the following components:   WBC 12.7 (*)    RBC 3.13 (*)     Hemoglobin 9.4 (*)    HCT 28.1 (*)    All other components within normal limits  BASIC METABOLIC PANEL - Abnormal; Notable for the following components:   Chloride 116 (*)    CO2 17 (*)    Glucose, Bld 147 (*)    BUN 37 (*)    Creatinine, Ser 1.94 (*)    Calcium 8.4 (*)    GFR, Estimated 33 (*)    All other components within normal limits  PHOSPHORUS - Abnormal; Notable for the following components:   Phosphorus 2.4 (*)    All other components within normal limits  GLUCOSE, CAPILLARY - Abnormal; Notable for the following components:   Glucose-Capillary 311 (*)    All other components within normal limits  GLUCOSE, CAPILLARY - Abnormal; Notable for the following components:   Glucose-Capillary 279 (*)    All other components within normal limits  HEPARIN LEVEL (UNFRACTIONATED) - Abnormal; Notable for the following components:   Heparin Unfractionated 0.80 (*)    All other components within normal limits  GLUCOSE, CAPILLARY - Abnormal; Notable for the following components:   Glucose-Capillary 186 (*)    All other components within normal limits  GLUCOSE, CAPILLARY - Abnormal; Notable for the following components:   Glucose-Capillary  133 (*)    All other components within normal limits  GLUCOSE, CAPILLARY - Abnormal; Notable for the following components:   Glucose-Capillary 122 (*)    All other components within normal limits  GLUCOSE, CAPILLARY - Abnormal; Notable for the following components:   Glucose-Capillary 145 (*)    All other components within normal limits  CBC - Abnormal; Notable for the following components:   WBC 13.2 (*)    RBC 3.25 (*)    Hemoglobin 9.9 (*)    HCT 29.6 (*)    All other components within normal limits  BASIC METABOLIC PANEL - Abnormal; Notable for the following components:   Chloride 117 (*)    CO2 15 (*)    Glucose, Bld 198 (*)    BUN 40 (*)    Creatinine, Ser 1.97 (*)    Calcium 8.3 (*)    GFR, Estimated 32 (*)    All other components  within normal limits  PHOSPHORUS - Abnormal; Notable for the following components:   Phosphorus 5.0 (*)    All other components within normal limits  GLUCOSE, CAPILLARY - Abnormal; Notable for the following components:   Glucose-Capillary 135 (*)    All other components within normal limits  GLUCOSE, CAPILLARY - Abnormal; Notable for the following components:   Glucose-Capillary 195 (*)    All other components within normal limits  GLUCOSE, CAPILLARY - Abnormal; Notable for the following components:   Glucose-Capillary 172 (*)    All other components within normal limits  GLUCOSE, CAPILLARY - Abnormal; Notable for the following components:   Glucose-Capillary 182 (*)    All other components within normal limits  GLUCOSE, CAPILLARY - Abnormal; Notable for the following components:   Glucose-Capillary 139 (*)    All other components within normal limits  CBC - Abnormal; Notable for the following components:   WBC 13.6 (*)    RBC 3.14 (*)    Hemoglobin 9.6 (*)    HCT 28.7 (*)    All other components within normal limits  BASIC METABOLIC PANEL - Abnormal; Notable for the following components:   Potassium 3.1 (*)    Chloride 121 (*)    CO2 14 (*)    Glucose, Bld 164 (*)    BUN 42 (*)    Creatinine, Ser 1.74 (*)    Calcium 8.6 (*)    GFR, Estimated 38 (*)    All other components within normal limits  HEPARIN LEVEL (UNFRACTIONATED) - Abnormal; Notable for the following components:   Heparin Unfractionated <0.10 (*)    All other components within normal limits  GLUCOSE, CAPILLARY - Abnormal; Notable for the following components:   Glucose-Capillary 153 (*)    All other components within normal limits  GLUCOSE, CAPILLARY - Abnormal; Notable for the following components:   Glucose-Capillary 149 (*)    All other components within normal limits  GLUCOSE, CAPILLARY - Abnormal; Notable for the following components:   Glucose-Capillary 157 (*)    All other components within normal  limits  GLUCOSE, CAPILLARY - Abnormal; Notable for the following components:   Glucose-Capillary 139 (*)    All other components within normal limits  GLUCOSE, CAPILLARY - Abnormal; Notable for the following components:   Glucose-Capillary 223 (*)    All other components within normal limits  CBC - Abnormal; Notable for the following components:   WBC 12.3 (*)    RBC 3.02 (*)    Hemoglobin 9.3 (*)    HCT 27.4 (*)  All other components within normal limits  BASIC METABOLIC PANEL - Abnormal; Notable for the following components:   Chloride 118 (*)    CO2 17 (*)    Glucose, Bld 152 (*)    BUN 43 (*)    Creatinine, Ser 2.05 (*)    Calcium 8.2 (*)    GFR, Estimated 31 (*)    All other components within normal limits  PHOSPHORUS - Abnormal; Notable for the following components:   Phosphorus 1.4 (*)    All other components within normal limits  GLUCOSE, CAPILLARY - Abnormal; Notable for the following components:   Glucose-Capillary 201 (*)    All other components within normal limits  GLUCOSE, CAPILLARY - Abnormal; Notable for the following components:   Glucose-Capillary 231 (*)    All other components within normal limits  GLUCOSE, CAPILLARY - Abnormal; Notable for the following components:   Glucose-Capillary 225 (*)    All other components within normal limits  GLUCOSE, CAPILLARY - Abnormal; Notable for the following components:   Glucose-Capillary 207 (*)    All other components within normal limits  GLUCOSE, CAPILLARY - Abnormal; Notable for the following components:   Glucose-Capillary 166 (*)    All other components within normal limits  GLUCOSE, CAPILLARY - Abnormal; Notable for the following components:   Glucose-Capillary 146 (*)    All other components within normal limits  URINALYSIS, ROUTINE W REFLEX MICROSCOPIC - Abnormal; Notable for the following components:   APPearance CLOUDY (*)    Hgb urine dipstick LARGE (*)    Protein, ur 100 (*)    Leukocytes,Ua LARGE  (*)    WBC, UA >50 (*)    Bacteria, UA MANY (*)    All other components within normal limits  GLUCOSE, CAPILLARY - Abnormal; Notable for the following components:   Glucose-Capillary 140 (*)    All other components within normal limits  GLUCOSE, CAPILLARY - Abnormal; Notable for the following components:   Glucose-Capillary 167 (*)    All other components within normal limits  I-STAT CHEM 8, ED - Abnormal; Notable for the following components:   BUN 37 (*)    Creatinine, Ser 2.20 (*)    Glucose, Bld 312 (*)    TCO2 20 (*)    Hemoglobin 10.2 (*)    HCT 30.0 (*)    All other components within normal limits  I-STAT ARTERIAL BLOOD GAS, ED - Abnormal; Notable for the following components:   pH, Arterial 7.294 (*)    pO2, Arterial 352 (*)    Bicarbonate 17.8 (*)    TCO2 19 (*)    Acid-base deficit 8.0 (*)    HCT 28.0 (*)    Hemoglobin 9.5 (*)    All other components within normal limits  POCT I-STAT 7, (LYTES, BLD GAS, ICA,H+H) - Abnormal; Notable for the following components:   pCO2 arterial 27.5 (*)    pO2, Arterial 160 (*)    Bicarbonate 15.7 (*)    TCO2 17 (*)    Acid-base deficit 9.0 (*)    HCT 28.0 (*)    Hemoglobin 9.5 (*)    All other components within normal limits  POCT I-STAT 7, (LYTES, BLD GAS, ICA,H+H) - Abnormal; Notable for the following components:   pH, Arterial 7.067 (*)    pO2, Arterial 276 (*)    Bicarbonate 13.4 (*)    TCO2 15 (*)    Acid-base deficit 16.0 (*)    Sodium 146 (*)    HCT 27.0 (*)  Hemoglobin 9.2 (*)    All other components within normal limits  POCT I-STAT 7, (LYTES, BLD GAS, ICA,H+H) - Abnormal; Notable for the following components:   pH, Arterial 7.323 (*)    pO2, Arterial 364 (*)    Bicarbonate 16.9 (*)    TCO2 18 (*)    Acid-base deficit 8.0 (*)    Sodium 147 (*)    HCT 28.0 (*)    Hemoglobin 9.5 (*)    All other components within normal limits  TROPONIN I (HIGH SENSITIVITY) - Abnormal; Notable for the following components:    Troponin I (High Sensitivity) 25 (*)    All other components within normal limits  TROPONIN I (HIGH SENSITIVITY) - Abnormal; Notable for the following components:   Troponin I (High Sensitivity) 481 (*)    All other components within normal limits  TROPONIN I (HIGH SENSITIVITY) - Abnormal; Notable for the following components:   Troponin I (High Sensitivity) 7,052 (*)    All other components within normal limits  RESP PANEL BY RT-PCR (FLU A&B, COVID) ARPGX2  MRSA NEXT GEN BY PCR, NASAL  URINE CULTURE  MAGNESIUM  TSH  T4, FREE  LACTIC ACID, PLASMA  LACTIC ACID, PLASMA  LIPID PANEL  MAGNESIUM  HEPARIN LEVEL (UNFRACTIONATED)  PROTIME-INR  APTT  MAGNESIUM  PHOSPHORUS  MAGNESIUM  MAGNESIUM  PHOSPHORUS  BLOOD GAS, ARTERIAL  PHOSPHORUS  MAGNESIUM  CBC  BASIC METABOLIC PANEL  MAGNESIUM  PHOSPHORUS    EKG EKG Interpretation  Date/Time:  Saturday September 20 2021 18:59:48 EST Ventricular Rate:  115 PR Interval:  85 QRS Duration: 167 QT Interval:  471 QTC Calculation: 652 R Axis:   -74 Text Interpretation: Sinus or ectopic atrial tachycardia or aflutter  w 3:1 Right bundle branch block LVH with IVCD and secondary repol abnrm Prolonged QT interval Abnormal ECG Confirmed by Carmin Muskrat [id number]) on 09/21/2021 5:25:56 PM  Radiology CT HEAD WO CONTRAST (5MM)  Result Date: 09/16/2021 CLINICAL DATA:  Stroke, follow-up. EXAM: CT HEAD WITHOUT CONTRAST TECHNIQUE: Contiguous axial images were obtained from the base of the skull through the vertex without intravenous contrast. COMPARISON:  Head CT from earlier same day at 5:58 a.m. FINDINGS: Brain: Stable size and configuration of the acute parenchymal hematoma within the LEFT frontoparietal lobe, again measuring 4 x 2.9 cm. Surrounding parenchymal edema is grossly stable in extent. No evidence of a significant midline shift or herniation related to this hemorrhage and edema. There is generalized age related parenchymal volume loss  with commensurate dilatation of the ventricles and sulci. No new parenchymal hemorrhage or edema. Vascular: Chronic calcified atherosclerotic changes of the large vessels at the skull base. No unexpected hyperdense vessel. Skull: Normal. Negative for fracture or focal lesion. Sinuses/Orbits: No acute finding. Other: None. IMPRESSION: 1. Stable size and configuration of the acute parenchymal hemorrhage within the LEFT frontoparietal lobe, again measuring 4 x 2.9 cm. Surrounding parenchymal edema is also grossly stable in extent. 2. No new parenchymal hemorrhage or edema. No midline shift or herniation. Electronically Signed   By: Franki Cabot M.D.   On: 09/22/2021 16:27   CT HEAD WO CONTRAST (5MM)  Result Date: 08/30/2021 CLINICAL DATA:  Hemorrhagic stroke follow-up EXAM: CT HEAD WITHOUT CONTRAST TECHNIQUE: Contiguous axial images were obtained from the base of the skull through the vertex without intravenous contrast. COMPARISON:  Yesterday FINDINGS: Brain: 29 x 40 mm hematoma the left frontal parietal junction with regional low-density swelling that is mildly progressed. Anteriorly is a low-density space that has  filled in with blood. Still no midline shift of the atrophic brain. Small volume local subarachnoid hemorrhage. No hydrocephalus. Vascular: Atheromatous calcification. Skull: Negative for fracture.  Left parietal scalp swelling Sinuses/Orbits: Negative IMPRESSION: Mildly increased edema around the left cerebral hemorrhage which is not significantly increased in size. Electronically Signed   By: Jorje Guild M.D.   On: 09/06/2021 06:31   CARDIAC CATHETERIZATION  Result Date: 09/24/2021 Bradycardia with cardiac arrest. Pacing wire repositioned into the RV His current heart rate is 130 sinus tachycardia. Pacing set at backup of 40 bpm.   CARDIAC CATHETERIZATION  Result Date: 09/15/2021 Bradycardia, cardiac arrest 2.   Successful placement temporary transvenous pacing wire from the left  femoral vein.   DG Chest Port 1 View  Result Date: 09/24/2021 CLINICAL DATA:  Intubated.  Status post cardiac arrest. EXAM: PORTABLE CHEST 1 VIEW COMPARISON:  Yesterday. FINDINGS: Endotracheal tube in satisfactory position. Nasogastric tube side hole in the proximal stomach with the tip not included. Stable borderline enlarged cardiac silhouette. Increased left lower lobe atelectasis. Clear right lung. Thoracic spine degenerative changes. IMPRESSION: Increased left lower lobe atelectasis. Electronically Signed   By: Claudie Revering M.D.   On: 09/24/2021 09:03   DG Chest Port 1 View  Result Date: 09/24/2021 CLINICAL DATA:  ET/NG tube placement EXAM: PORTABLE CHEST 1 VIEW COMPARISON:  Chest x-ray dated September 20, 2021 FINDINGS: ET tube is unchanged in position. OG tube tip is in the stomach with side port near the GE junction. Mild bilateral heterogeneous pulmonary opacities, similar to prior exam. No large pleural effusion or evidence of pneumothorax. Cardiac and mediastinal contours are unchanged. IMPRESSION: 1. OG tube tip is in the stomach with side port near the GE junction, consider advancement for optimal positioning. 2. Mild bilateral heterogeneous pulmonary opacities, likely a combination of atelectasis and pulmonary edema. Radiology ilia speaking Electronically Signed   By: Yetta Glassman M.D.   On: 09/18/2021 09:07   DG Abd Portable 1V  Result Date: 09/24/2021 CLINICAL DATA:  Feeding tube placement EXAM: PORTABLE ABDOMEN - 1 VIEW COMPARISON:  None. FINDINGS: Feeding tube tip projects over the expected area of the stomach. Retained intraluminal contrast noted in the colon. No gas-filled dilated loops of bowel seen in the partially visualized abdomen and pelvis. Right femoral line. Left femoral vein approach trans venous pacing wire partially visualized. IMPRESSION: Feeding tube tip projects over the expected area of the stomach. Electronically Signed   By: Yetta Glassman M.D.   On:  09/24/2021 12:10   VAS US CAROTID  Result Date: 08/29/2021 Carotid Arterial Duplex Study Patient Name:  RODRICK PAYSON  Date of Exam:   09/06/2021 Medical Rec #: [id number]        Accession #:    [id number] Date of Birth: 01/30/1933        Patient Gender: M Patient Age:   35 years Exam Location:  [facility] Procedure:      VAS US CAROTID Referring Phys: Cornelius Moras XU --------------------------------------------------------------------------------  Indications:      CVA. Risk Factors:     Hypertension, hyperlipidemia, Diabetes, coronary artery                   disease. Comparison Study: No prior study Performing Technologist: Maudry Mayhew MHA, RDMS, RVT, RDCS  Examination Guidelines: A complete evaluation includes B-mode imaging, spectral Doppler, color Doppler, and power Doppler as needed of all accessible portions of each vessel. Bilateral testing is considered an integral part of a complete examination.  Limited examinations for reoccurring indications may be performed as noted.  Right Carotid Findings: +----------+--------+--------+--------+-----------------------+--------+             PSV cm/s EDV cm/s Stenosis Plaque Description      Comments  +----------+--------+--------+--------+-----------------------+--------+  CCA Prox   60       14                                                  +----------+--------+--------+--------+-----------------------+--------+  CCA Distal 47       13                                                  +----------+--------+--------+--------+-----------------------+--------+  ICA Prox   39       14                smooth and heterogenous           +----------+--------+--------+--------+-----------------------+--------+  ICA Distal 60       21                                                  +----------+--------+--------+--------+-----------------------+--------+  ECA        62       6                 smooth and heterogenous            +----------+--------+--------+--------+-----------------------+--------+ +----------+--------+-------+----------------+-------------------+             PSV cm/s EDV cms Describe         Arm Pressure (mmHG)  +----------+--------+-------+----------------+-------------------+  Subclavian 57               Multiphasic, WNL                      +----------+--------+-------+----------------+-------------------+ +---------+--------+--+--------+--+---------+  Vertebral PSV cm/s 57 EDV cm/s 12 Antegrade  +---------+--------+--+--------+--+---------+  Left Carotid Findings: +----------+-------+--------+--------+-----------------------+-----------------+             PSV     EDV cm/s Stenosis Plaque Description      Comments                       cm/s                                                                 +----------+-------+--------+--------+-----------------------+-----------------+  CCA Prox   69      18                                                           +----------+-------+--------+--------+-----------------------+-----------------+  CCA Distal 69  21                                        intimal                                                                          thickening         +----------+-------+--------+--------+-----------------------+-----------------+  ICA Prox   47      13                heterogenous and                                                                 calcific                                   +----------+-------+--------+--------+-----------------------+-----------------+  ICA Distal 91      40                                                           +----------+-------+--------+--------+-----------------------+-----------------+  ECA        41      5                 heterogenous and                                                                 calcific                                   +----------+-------+--------+--------+-----------------------+-----------------+  +----------+--------+--------+----------------+-------------------+             PSV cm/s EDV cm/s Describe         Arm Pressure (mmHG)  +----------+--------+--------+----------------+-------------------+  Subclavian 60                Multiphasic, WNL                      +----------+--------+--------+----------------+-------------------+ +---------+--------+--+--------+--+---------+  Vertebral PSV cm/s 69 EDV cm/s 21 Antegrade  +---------+--------+--+--------+--+---------+   Summary: Right Carotid: Velocities in the right ICA are consistent with a 1-39% stenosis. Left Carotid: Velocities in the left ICA are consistent with a 1-39% stenosis. Vertebrals:  Bilateral vertebral arteries demonstrate antegrade flow. Subclavians: Normal flow hemodynamics were seen in bilateral subclavian  arteries. *See table(s) above for measurements and observations.     Preliminary     Procedures .Critical Care Performed by: Tonye Pearson, PA-C Authorized by: Tonye Pearson, PA-C   Critical care provider statement:    Critical care time (minutes):  30   Critical care start time:  09/25/2021 7:11 PM   Critical care end time:  08/31/2021 8:15 AM   Critical care was necessary to treat or prevent imminent or life-threatening deterioration of the following conditions:  Cardiac failure, circulatory failure, CNS failure or compromise, shock and respiratory failure   Critical care was time spent personally by me on the following activities:  Development of treatment plan with patient or surrogate, discussions with consultants, evaluation of patient's response to treatment, examination of patient, ordering and review of laboratory studies, ordering and review of radiographic studies, ordering and performing treatments and interventions, pulse oximetry, re-evaluation of patient's condition, review of old charts, transcutaneous pacing, ventilator management and obtaining history from patient or surrogate   I assumed  direction of critical care for this patient from another provider in my specialty: no     Care discussed with: admitting provider   Procedure Name: Intubation Date/Time: 09/24/2021 7:12 PM Performed by: Tonye Pearson, PA-C Pre-anesthesia Checklist: Patient identified, Patient being monitored, Emergency Drugs available, Timeout performed and Suction available Oxygen Delivery Method: Non-rebreather mask Preoxygenation: Pre-oxygenation with 100% oxygen Induction Type: Rapid sequence Ventilation: Mask ventilation without difficulty Laryngoscope Size: 1 Grade View: Grade II Tube type: Subglottic suction tube Tube size: 3.0 mm Number of attempts: 1 Airway Equipment and Method: Video-laryngoscopy Placement Confirmation: ETT inserted through vocal cords under direct vision, CO2 detector and Breath sounds checked- equal and bilateral Secured at: 21 cm Tube secured with: ETT holder Dental Injury: Teeth and Oropharynx as per pre-operative assessment  Future Recommendations: Recommend- induction with short-acting agent, and alternative techniques readily available      Medications Ordered in ED Medications  etomidate (AMIDATE) injection (20 mg Intravenous Given 09/15/2021 1806)  fentaNYL (SUBLIMAZE) injection 25 mcg ( Intravenous MAR Unhold 09/04/2021 1224)  fentaNYL 2584mcg in NS 24mL (18mcg/ml) infusion-PREMIX (50 mcg/hr Intravenous IV Pump Association 09/15/2021 1305)  EPINEPHrine (ADRENALIN) 5 mg in NS 250 mL (0.02 mg/mL) premix infusion ( Intravenous MAR Unhold 08/29/2021 1224)  docusate (COLACE) 50 MG/5ML liquid 100 mg (100 mg Per Tube Given 09/24/21 1254)  polyethylene glycol (MIRALAX / GLYCOLAX) packet 17 g (17 g Per Tube Not Given 09/24/21 1316)  ondansetron (ZOFRAN) injection 4 mg ( Intravenous MAR Unhold 08/31/2021 1224)  acetaminophen (TYLENOL) tablet 650 mg ( Oral See Alternative 09/24/21 1612)    Or  acetaminophen (TYLENOL) 160 MG/5ML solution 650 mg (650 mg Per Tube Given 09/24/21  1612)    Or  acetaminophen (TYLENOL) suppository 650 mg ( Rectal See Alternative 09/24/21 1612)  0.9 %  sodium chloride infusion ( Intravenous Stopped 09/21/21 1539)  insulin aspart (novoLOG) injection 0-15 Units (3 Units Subcutaneous Given 09/24/21 1624)  lidocaine (LIDODERM) 5 % 1 patch (1 patch Transdermal Not Given 09/04/2021 1320)  pantoprazole sodium (PROTONIX) 40 mg/20 mL oral suspension 40 mg (40 mg Per Tube Given 09/27/2021 2053)  0.9 %  sodium chloride infusion ( Intravenous Paused 09/25/2021 0833)  hydrALAZINE (APRESOLINE) injection 10 mg ( Intravenous MAR Unhold 09/05/2021 1224)  promethazine (PHENERGAN) 6.25 mg in sodium chloride 0.9 % 50 mL IVPB ( Intravenous MAR Unhold 09/05/2021 1224)   stroke: mapping our early stages of recovery book ( Does not apply MAR Unhold 08/29/2021  1224)  acetaminophen (TYLENOL) tablet 650 mg ( Oral MAR Unhold 09/16/2021 1224)    Or  acetaminophen (TYLENOL) 160 MG/5ML solution 650 mg ( Per Tube MAR Unhold 09/16/2021 1224)    Or  acetaminophen (TYLENOL) suppository 650 mg ( Rectal MAR Unhold 09/14/2021 1224)  Chlorhexidine Gluconate Cloth 2 % PADS 6 each ( Topical MAR Unhold 09/02/2021 1224)  gadobutrol (GADAVIST) 1 MMOL/ML injection 9 mL ( Intravenous MAR Unhold 09/21/2021 1224)  chlorhexidine gluconate (MEDLINE KIT) (PERIDEX) 0.12 % solution 15 mL (15 mLs Mouth Rinse Given 09/24/21 0746)  MEDLINE mouth rinse (15 mLs Mouth Rinse Given 09/24/21 1816)  fentaNYL (SUBLIMAZE) injection 25 mcg ( Intravenous MAR Unhold 09/15/2021 1224)  fentaNYL (SUBLIMAZE) bolus via infusion 25-100 mcg (50 mcg Intravenous Bolus from Bag 09/24/21 0532)  midazolam (VERSED) injection 1 mg ( Intravenous MAR Unhold 09/25/2021 1224)  midazolam (VERSED) injection 1 mg ( Intravenous MAR Unhold 09/11/2021 1224)  cefTRIAXone (ROCEPHIN) 2 g in sodium chloride 0.9 % 100 mL IVPB (0 g Intravenous Stopped 09/24/21 1152)  sodium chloride flush (NS) 0.9 % injection 3 mL (3 mLs Intravenous Given 09/24/21 1317)   sodium chloride flush (NS) 0.9 % injection 3 mL ( Intravenous MAR Unhold 09/20/2021 1224)  0.9 %  sodium chloride infusion ( Intravenous MAR Unhold 09/01/2021 1224)  sodium bicarbonate 150 mEq in dextrose 5 % 1,150 mL infusion ( Intravenous Stopped 09/21/2021 2247)  norepinephrine (LEVOPHED) 4mg  in 223mL (0.016 mg/mL) premix infusion (0 mcg/min Intravenous Stopped 09/24/21 1721)  atorvastatin (LIPITOR) tablet 40 mg (40 mg Per Tube Given 09/24/21 1254)  senna-docusate (Senokot-S) tablet 1 tablet (1 tablet Per Tube Given 09/24/21 1506)  guaiFENesin (ROBITUSSIN) 100 MG/5ML liquid 5 mL (5 mLs Per Tube Given 09/24/21 1255)  feeding supplement (VITAL 1.5 CAL) liquid 1,000 mL (1,000 mLs Per Tube New Bag/Given 09/24/21 1503)  feeding supplement (PROSource TF) liquid 45 mL (45 mLs Per Tube Given 09/24/21 1612)  aspirin tablet 325 mg (325 mg Oral Given 09/21/21 0201)  heparin bolus via infusion 3,000 Units (3,000 Units Intravenous Bolus from Bag 09/21/21 0216)  furosemide (LASIX) injection 40 mg (40 mg Intravenous Given 09/21/21 1214)  potassium PHOSPHATE 15 mmol in dextrose 5 % 250 mL infusion (0 mmol Intravenous Stopped 09/22/21 0407)  magnesium sulfate IVPB 2 g 50 mL (0 g Intravenous Stopped 09/22/21 0553)  protamine injection 50 mg (50 mg Intravenous Given 09/22/21 0439)  gadobutrol (GADAVIST) 1 MMOL/ML injection 9 mL (9 mLs Intravenous Contrast Given 09/22/21 1113)  potassium chloride (KLOR-CON) packet 20 mEq (20 mEq Oral Given 09/22/2021 0619)  potassium chloride 10 mEq in 100 mL IVPB (0 mEq Intravenous Paused 09/15/2021 0733)  rocuronium (ZEMURON) injection 50 mg (50 mg Intravenous Given 08/31/2021 0836)  etomidate (AMIDATE) injection 10 mg (10 mg Intravenous Given 09/16/2021 0834)  fentaNYL (SUBLIMAZE) injection 100 mcg (100 mcg Intravenous Given 09/08/2021 0836)  fentaNYL (SUBLIMAZE) 50 MCG/ML injection (  Duplicate 63/89/37 3428)  potassium chloride 10 mEq in 50 mL *CENTRAL LINE* IVPB (0 mEq Intravenous  Stopped 09/25/2021 1858)  furosemide (LASIX) 10 MG/ML injection (  Duplicate 76/81/15 7262)  furosemide (LASIX) injection 40 mg (40 mg Intravenous Given 08/28/2021 1108)  sodium bicarbonate injection 50 mEq (50 mEq Intravenous Given 09/16/2021 1104)  sodium phosphate 30 mmol in dextrose 5 % 250 mL infusion (0 mmol Intravenous Stopped 09/24/21 1347)  potassium chloride (KLOR-CON) packet 20 mEq (20 mEq Per Tube Given 09/24/21 0355)    ED Course  I have reviewed the triage vital signs and the  nursing notes.  Pertinent labs & imaging results that were available during my care of the patient were reviewed by me and considered in my medical decision making (see chart for details).    MDM Rules/Calculators/A&P                         This patient presents to the ED for concern of cardiac arrhythmia causing arrest, this involves an extensive number of treatment options, and is a complaint that carries with it a high risk of complications and morbidity.  The differential diagnosis includes The differential for syncope is extensive and includes, but is not limited to: arrythmia (Vtach, SVT, SSS, sinus arrest, AV block, bradycardia) aortic stenosis, AMI, HOCM, PE, atrial myxoma, pulmonary hypertension, orthostatic hypotension, (hypovolemia, drug effect, GB syndrome, micturition, cough, swall) carotid sinus sensitivity, Seizure, TIA/CVA, hypoglycemia,  Vertigo.    Additional history obtained:  Additional history obtained from wife and family member External records from outside source obtained and reviewed including recent records from pulmonologist   Lab Tests:  I Ordered, reviewed, and interpreted labs.  The pertinent results include: Lactic acid normal, magnesium normal, phosphorus slightly low, CBC with elevated white count and moderate anemia at 9.4.  BMP at baseline, TSH normal, A1c at 8.3; ABG shows metabolic acidosis with pH of 7.  2 9 and bicarbonate of 17.8.   Imaging Studies ordered:  I  ordered imaging studies including CT head and cervical spine and x-ray of chest I independently visualized and interpreted imaging which showed proper placement of ET tube on chest x-ray.  CT head and neck without acute intracranial pathology.  Partially viewed radiodensities of bilateral upper lobes that could be contusion or pneumonia.  Given patient's CPR, suspect this is injury from this. I agree with the radiologist interpretation   Cardiac Monitoring:  The patient was maintained on a cardiac monitor.  I personally viewed and interpreted the cardiac monitored which showed an underlying rhythm of: Sinus tach   Medicines ordered and prescription drug management:  I ordered medication including etomidate, epinephrine, fentanyl NS bolus and infusion, for pain management, sedation and rhythm control. Reevaluation of the patient after these medicines showed that the patient improved.  Patient's heart rate maintained in sinus tach at around 120 bpm I have reviewed the patients home medicines and have made adjustments as needed   Critical Interventions:  As described above   Consultations Obtained:  Dr. Darl Householder spoke with cardiology for recommendations on consult on patient status I requested consultation with ICU team,  and discussed lab and imaging findings as well as pertinent plan - they recommend: Admission to ICU   Reevaluation:  After the interventions noted above, I reevaluated the patient and found that they have :stayed the same   Dispostion:  After consideration of the diagnostic results and the patients response to treatment feel that the patent would benefit from admission to ICU with cardiology consult.  ICU team agrees to admit and further treatment and disposition to be determined by them..      Final Clinical Impression(s) / ED Diagnoses Final diagnoses:  Cardiac arrest ([place])  Third degree heart block ([place])  Tachy-brady syndrome ([place])  Encounter for central line  placement    Rx / DC Orders ED Discharge Orders          Ordered    Ambulatory referral to Neurology       Comments: Follow up with stroke clinic NP (Jessica Benson or Leon  Hassell Done, if both not available, consider Leonie Man, Penumali, or Ahern) at [facility] in about 4 weeks. Thanks.   09/24/21 1847             Tonye Pearson, PA-C 09/24/21 1928    Drenda Freeze, MD 10-10-2021 [phone]

## 2021-09-24 NOTE — Procedures (Signed)
Cortrak  Person Inserting Tube:  Gaetano Romberger, Creola Corn, RD Tube Type:  Cortrak - 43 inches Tube Size:  10 Tube Location:  Left nare Initial Placement:  Stomach Secured by: Bridle Technique Used to Measure Tube Placement:  Marking at nare/corner of mouth Cortrak Secured At:  65 cm  Cortrak Tube Team Note:  Consult received to place a Cortrak feeding tube.   X-ray is required, abdominal x-ray has been ordered by the Cortrak team. Please confirm tube placement before using the Cortrak tube.   If the tube becomes dislodged please keep the tube and contact the Cortrak team at [email or web address] (password [email or web address]) for replacement.  If after hours and replacement cannot be delayed, place a NG tube and confirm placement with an abdominal x-ray.     Theone Stanley., MS, RD, LDN (she/her/hers) RD pager number and weekend/on-call pager number located in [place].

## 2021-09-24 NOTE — Progress Notes (Addendum)
eLink Physician-Brief Progress Note Patient Name: Aaron Carlson DOB: 12-05-1932 MRN: [id number]   Date of Service  09/24/2021  HPI/Events of Note  Hypokalemia   Hypophosphatemia - K+ = 3.5, PO4---= 1.4 and Creatinine = 2.05.   eICU Interventions  Plan: Will replace K+ and PO4---. Repeat Phosphorus level at 2 PM.     Intervention Category Major Interventions: Electrolyte abnormality - evaluation and management  Lamarr Feenstra [place] 09/24/2021, 6:30 AM

## 2021-09-24 NOTE — Progress Notes (Signed)
NAME:  Aaron Carlson, MRN:  [id number], DOB:  04-Nov-1932, LOS: 4 ADMISSION DATE:  09/09/2021, CONSULTATION DATE:  12/24 REFERRING MD:  Darl Householder, CHIEF COMPLAINT:  syncope   History of Present Illness:  Patient is encephalopathic and/or intubated. Therefore history has been obtained from chart review.   Aaron Carlson, is a 87 y.o. male, who presented to the [facility] ED with a chief complaint of syncope  They have a pertinent past medical history of CAD, combined systolic and diastolic HF, HTN, HLD, DM, CKD 3/4, GERD  Per documentation, patient had witnessed syncopal episode while at home, hit head on ground, reported unresponsive for 1 min. On arrival EMS found patient in complete heart block with rate of 30. Patient reported oriented x4. Atropine given, pacing initiated, 5mg  versed given. Per EDP patient removed pacing pads, HR decreased, and then became pulseless. CPR initiated with 3 rounds of CPR and 1 epi  He was intubated in the ED. Cardiology was consulted. CT head is pending. Trop 25. ECG: ST, RBBB, IVCD ,7.29/36/352.17.2. CXR with no infiltrate, pneumo, or effusion.   PCCM was consulted for admission.   Pertinent  Medical History  CAD, combined systolic and diastolic HF, HTN, HLD, DM, CKD 3/4, GERD  Significant Hospital Events: Including procedures, antibiotic start and stop dates in addition to other pertinent events   12/24 Syncope, EMS called, 3rd degree HB, paced, removed pads, asystole, CPR, intubated in ED. CT head> negative. 12/25 successfully extubated 12/26 developed acute right-sided weakness facial droop.  Seen by neurology.  Has 23 mm ICH in left MCA territory, SJ H and left scalp hematoma.  Heparin given for possible ACS stopped and reversed with protamine. 12/27 recurrent arrest, TVP placement  Interim History / Subjective:  Awake on vent, starting to wean. Pressor requirement going down. TVP on backup  Objective   Blood pressure (!) 148/54, pulse 76, temperature  98.4 F (36.9 C), resp. rate 20, height 5\' 8"  (1.727 m), weight 90.6 kg, SpO2 100 %.    Vent Mode: PSV;CPAP FiO2 (%):  [40 %-100 %] 40 % Set Rate:  [16 bmp-26 bmp] 26 bmp Vt Set:  [540 mL] 540 mL PEEP:  [5 cmH20] 5 cmH20 Pressure Support:  [12 cmH20] 12 cmH20 Plateau Pressure:  [18 cmH20-25 cmH20] 19 cmH20   Intake/Output Summary (Last 24 hours) at 09/24/2021 0731 Last data filed at 09/24/2021 0700 Gross per 24 hour  Intake 1928.68 ml  Output 2150 ml  Net -221.32 ml    Filed Weights   08/31/2021 2200 09/19/2021 0600 09/24/21 0500  Weight: 91.9 kg 90.6 kg 90.6 kg    Examination: No distress, tracking Lungs suprisingly clear Moves L briskly, R less so Ext warm Heart sounds tachycardic, no murmur  Cr mildly worse CBC stable Bicarb low Phos/K low, repleting  Assessment & Plan:  Acute intracranial hemorrhage likely secondary to subclinical cerebral contusion at time of initial syncope.  Hemorrhaged when started on anticoagulation.  No clear evidence of underlying mass on MRI Cardiac arrest- asystole, recurrent- TVP now in plcae Third degree heart block- presented on admission  Syncope- suspect secondary to arrhythmia vs hypoperfusion NAGMA- suspect secondary to chronic renal disease Acute respiratory failure with hypoxia- back on vent HX CAD Hx systolic and diastolic CHF Probable ACS with intervention contraindicated HX HLD  HX HTN Syncope/Fall DM2 AKI on CKD3-4- a little worse today but not bad all things considered given events on 12/27 GERD  - SAT/SBT, consider extubation - Will need aggressive rehab,  hopefully IPR candidate - Will need PPM at some point - Wean levophed for MAP 65 - Cortrak - Continue TVP to backup - Continue to hold blood thinners for now, will d/w neuro on timing of any antiplatelet agents - Family updated at bedside at length  Best Practice (right click and "Reselect all SmartList Selections" daily)   Diet/type: NPO w/ meds via tube DVT  prophylaxis: SCD, GI prophylaxis: PPI Lines: N/A Foley:  Yes, and it is still needed Code Status:  full code Last date of multidisciplinary goals of care discussion [Full scope, discussed with Everlena Mcenaney at bedside on 12/27 ]   Patient critically ill due to shock, resp failure Interventions to address this today vent/pressor titraton Risk of deterioration without these interventions is high  I personally spent 34 minutes providing critical care not including any separately billable procedures  Erskine Emery MD [facility]  Prefer epic messenger for cross cover needs If after hours, please call E-link

## 2021-09-24 NOTE — Progress Notes (Signed)
Nutrition Follow-up  DOCUMENTATION CODES:   Non-severe (moderate) malnutrition in context of chronic illness  INTERVENTION:   Tube Feeding via Cortrak:  Vital 1.5 at 50 ml/hr Pro-Source TF 45 mL TID Provides 1920 kcals, 114 g of protein 912 mL of free water  If pt continues without BM, recommend increasing bowel regimen  NUTRITION DIAGNOSIS:   Moderate Malnutrition related to chronic illness as evidenced by mild muscle depletion, mild fat depletion.  GOAL:   Patient will meet greater than or equal to 90% of their needs  MONITOR:   Diet advancement, Labs, Weight trends, TF tolerance  REASON FOR ASSESSMENT:   Ventilator    ASSESSMENT:   87 yo male admitted with syncope, 3rd degree HB, became asystole and required CPR and intubation. Pt then developed acute right sided weakness/facial droop 2 days later with ICH. PMH includes CAD, CHF, HTN, HLD, DM, CKD 3/4, GERD.  12/24 Admitted, syncope, CPR 12/25 Extubated 12/26 ICH in L MCA territory 12/27 recurrent arrest, Intubated 12/28 extubated, cortrak  Family at bedside. Pt is lethargic but arousable.   NPO currently. Evaluated by SLP on 12/26 with diet advancement to Dysphagia 1, Honey thick but then NPO with recurrent arrest. No recorded po intake since admission  Family reports pt had been eating less with associated weight loss PTA. Family reports pt has been eating smaller amounts at meal times and pt reports eating larger quantities makes his acid reflux worse. Pt has been dealing with significant GERD, seeing GI outpatient for this. Pt eats about 4 times a day but self-limits his po intake to reduce his GERD. Pt eats eggs and sausage or cream cheese on english muffin for breakfast, he has an orange mid morning, eats a "normal" lunch and then wife fixes several veggies and meat for dinner  Family reports UBW around 200 pounds. Current wt 199.74 kg. Noted weight of 220 pounds in January of this year. 10% wt  loss  Labs:phosphorus 1.4 (L), CBGs 140-231 (ICU goal 140-180), sodium 145 Meds: colace, ss novolog, miralax, sodium phosphate, senokot  NUTRITION - FOCUSED PHYSICAL EXAM:  Flowsheet Row Most Recent Value  Orbital Region Mild depletion  Upper Arm Region No depletion  Thoracic and Lumbar Region No depletion  Buccal Region Moderate depletion  Temple Region Moderate depletion  Clavicle Bone Region Mild depletion  Clavicle and Acromion Bone Region Mild depletion  Scapular Bone Region Mild depletion  Dorsal Hand Moderate depletion  Patellar Region Mild depletion  Anterior Thigh Region Mild depletion  Posterior Calf Region Mild depletion  Edema (RD Assessment) Mild       Diet Order:   Diet Order             Diet NPO time specified  Diet effective now                   EDUCATION NEEDS:   Not appropriate for education at this time  Skin:  Skin Assessment: Reviewed RN Assessment  Last BM:  prior to admission  Height:   Ht Readings from Last 1 Encounters:  09/12/2021 5\' 8"  (1.727 m)    Weight:   Wt Readings from Last 1 Encounters:  09/24/21 90.6 kg     BMI:  Body mass index is 30.37 kg/m.  Estimated Nutritional Needs:   Kcal:  1900-2100 kcals  Protein:  105-120 g  Fluid:  >/= 1.9 L   Kerman Passey MS, RDN, LDN, CNSC Registered Dietitian III Clinical Nutrition RD Pager and On-Call Pager Number  Located in [place]

## 2021-09-25 ENCOUNTER — Inpatient Hospital Stay (HOSPITAL_COMMUNITY): Payer: Medicare HMO

## 2021-09-25 DIAGNOSIS — E440 Moderate protein-calorie malnutrition: Secondary | ICD-10-CM | POA: Insufficient documentation

## 2021-09-25 DIAGNOSIS — S06351A Traumatic hemorrhage of left cerebrum with loss of consciousness of 30 minutes or less, initial encounter: Secondary | ICD-10-CM | POA: Diagnosis not present

## 2021-09-25 DIAGNOSIS — I442 Atrioventricular block, complete: Secondary | ICD-10-CM | POA: Diagnosis not present

## 2021-09-25 DIAGNOSIS — I469 Cardiac arrest, cause unspecified: Secondary | ICD-10-CM | POA: Diagnosis not present

## 2021-09-25 LAB — CBC
HCT: 27.7 % — ABNORMAL LOW (ref 39.0–52.0)
Hemoglobin: 8.6 g/dL — ABNORMAL LOW (ref 13.0–17.0)
MCH: 29.6 pg (ref 26.0–34.0)
MCHC: 31 g/dL (ref 30.0–36.0)
MCV: 95.2 fL (ref 80.0–100.0)
Platelets: 140 10*3/uL — ABNORMAL LOW (ref 150–400)
RBC: 2.91 MIL/uL — ABNORMAL LOW (ref 4.22–5.81)
RDW: 15.4 % (ref 11.5–15.5)
WBC: 11.5 10*3/uL — ABNORMAL HIGH (ref 4.0–10.5)
nRBC: 0 % (ref 0.0–0.2)

## 2021-09-25 LAB — BASIC METABOLIC PANEL
Anion gap: 9 (ref 5–15)
BUN: 46 mg/dL — ABNORMAL HIGH (ref 8–23)
CO2: 15 mmol/L — ABNORMAL LOW (ref 22–32)
Calcium: 8.3 mg/dL — ABNORMAL LOW (ref 8.9–10.3)
Chloride: 120 mmol/L — ABNORMAL HIGH (ref 98–111)
Creatinine, Ser: 2.21 mg/dL — ABNORMAL HIGH (ref 0.61–1.24)
GFR, Estimated: 28 mL/min — ABNORMAL LOW (ref >60)
Glucose, Bld: 255 mg/dL — ABNORMAL HIGH (ref 70–99)
Potassium: 3.5 mmol/L (ref 3.5–5.1)
Sodium: 144 mmol/L (ref 135–145)

## 2021-09-25 LAB — PHOSPHORUS: Phosphorus: 3.2 mg/dL (ref 2.5–4.6)

## 2021-09-25 LAB — POCT I-STAT 7, (LYTES, BLD GAS, ICA,H+H)
Acid-base deficit: 11 mmol/L — ABNORMAL HIGH (ref 0.0–2.0)
Acid-base deficit: 11 mmol/L — ABNORMAL HIGH (ref 0.0–2.0)
Bicarbonate: 16 mmol/L — ABNORMAL LOW (ref 20.0–28.0)
Bicarbonate: 13.8 mmol/L — ABNORMAL LOW (ref 20.0–28.0)
Calcium, Ion: 1.26 mmol/L (ref 1.15–1.40)
Calcium, Ion: 1.22 mmol/L (ref 1.15–1.40)
HCT: 29 % — ABNORMAL LOW (ref 39.0–52.0)
HCT: 25 % — ABNORMAL LOW (ref 39.0–52.0)
Hemoglobin: 9.9 g/dL — ABNORMAL LOW (ref 13.0–17.0)
Hemoglobin: 8.5 g/dL — ABNORMAL LOW (ref 13.0–17.0)
O2 Saturation: 100 %
O2 Saturation: 100 %
Patient temperature: 99
Patient temperature: 98.8
Potassium: 3.9 mmol/L (ref 3.5–5.1)
Potassium: 3.9 mmol/L (ref 3.5–5.1)
Sodium: 150 mmol/L — ABNORMAL HIGH (ref 135–145)
Sodium: 146 mmol/L — ABNORMAL HIGH (ref 135–145)
TCO2: 17 mmol/L — ABNORMAL LOW (ref 22–32)
TCO2: 15 mmol/L — ABNORMAL LOW (ref 22–32)
pCO2 arterial: 41.1 mmHg (ref 32.0–48.0)
pCO2 arterial: 26.9 mmHg — ABNORMAL LOW (ref 32.0–48.0)
pH, Arterial: 7.199 — CL (ref 7.350–7.450)
pH, Arterial: 7.32 — ABNORMAL LOW (ref 7.350–7.450)
pO2, Arterial: 298 mmHg — ABNORMAL HIGH (ref 83.0–108.0)
pO2, Arterial: 195 mmHg — ABNORMAL HIGH (ref 83.0–108.0)

## 2021-09-25 LAB — EXPECTORATED SPUTUM ASSESSMENT W GRAM STAIN, RFLX TO RESP C

## 2021-09-25 LAB — GLUCOSE, CAPILLARY
Glucose-Capillary: 165 mg/dL — ABNORMAL HIGH (ref 70–99)
Glucose-Capillary: 210 mg/dL — ABNORMAL HIGH (ref 70–99)
Glucose-Capillary: 214 mg/dL — ABNORMAL HIGH (ref 70–99)
Glucose-Capillary: 290 mg/dL — ABNORMAL HIGH (ref 70–99)
Glucose-Capillary: 338 mg/dL — ABNORMAL HIGH (ref 70–99)
Glucose-Capillary: 356 mg/dL — ABNORMAL HIGH (ref 70–99)

## 2021-09-25 LAB — MAGNESIUM: Magnesium: 2.3 mg/dL (ref 1.7–2.4)

## 2021-09-25 MED ORDER — POLYETHYLENE GLYCOL 3350 17 G PO PACK
17.0000 g | PACK | Freq: Every day | ORAL | Status: DC
  Filled 2021-09-25: qty 1

## 2021-09-25 MED ORDER — MIDAZOLAM HCL 2 MG/2ML IJ SOLN
INTRAMUSCULAR | Status: AC
  Administered 2021-09-25: 13:00:00 2 mg via INTRAVENOUS
  Filled 2021-09-25: qty 2

## 2021-09-25 MED ORDER — FREE WATER
200.0000 mL | Freq: Four times a day (QID) | Status: DC
  Administered 2021-09-25 – 2021-09-26 (×4): 200 mL

## 2021-09-25 MED ORDER — ORAL CARE MOUTH RINSE
15.0000 mL | OROMUCOSAL | Status: DC
  Administered 2021-09-25 – 2021-09-26 (×8): 15 mL via OROMUCOSAL

## 2021-09-25 MED ORDER — CHLORHEXIDINE GLUCONATE 0.12% ORAL RINSE (MEDLINE KIT)
15.0000 mL | Freq: Two times a day (BID) | OROMUCOSAL | Status: DC
  Administered 2021-09-25 – 2021-09-26 (×2): 15 mL via OROMUCOSAL

## 2021-09-25 MED ORDER — VANCOMYCIN HCL 1500 MG/300ML IV SOLN
1500.0000 mg | INTRAVENOUS | Status: DC
  Administered 2021-09-25: 16:00:00 1500 mg via INTRAVENOUS
  Filled 2021-09-25: qty 300

## 2021-09-25 MED ORDER — ROCURONIUM BROMIDE 10 MG/ML (PF) SYRINGE
PREFILLED_SYRINGE | INTRAVENOUS | Status: AC
  Administered 2021-09-25: 13:00:00 100 mg via INTRAVENOUS
  Filled 2021-09-25: qty 10

## 2021-09-25 MED ORDER — SODIUM CHLORIDE 0.9 % IV SOLN
2.0000 g | INTRAVENOUS | Status: DC
  Administered 2021-09-25: 19:00:00 2 g via INTRAVENOUS
  Filled 2021-09-25: qty 2

## 2021-09-25 MED ORDER — FENTANYL CITRATE PF 50 MCG/ML IJ SOSY: 25.0000 ug | PREFILLED_SYRINGE | INTRAMUSCULAR | Status: DC | PRN

## 2021-09-25 MED ORDER — DOCUSATE SODIUM 50 MG/5ML PO LIQD
100.0000 mg | Freq: Two times a day (BID) | ORAL | Status: DC
  Administered 2021-09-25: 22:00:00 100 mg
  Filled 2021-09-25 (×2): qty 10

## 2021-09-25 MED ORDER — ETOMIDATE 2 MG/ML IV SOLN: 20.0000 mg | Freq: Once | INTRAVENOUS | Status: AC

## 2021-09-25 MED ORDER — PROPOFOL 1000 MG/100ML IV EMUL
0.0000 ug/kg/min | INTRAVENOUS | Status: DC
  Administered 2021-09-25: 21:00:00 19 ug/kg/min via INTRAVENOUS
  Filled 2021-09-25 (×2): qty 100

## 2021-09-25 MED ORDER — PROPOFOL 1000 MG/100ML IV EMUL
INTRAVENOUS | Status: AC
  Administered 2021-09-25: 13:00:00 15 ug/kg/min via INTRAVENOUS
  Filled 2021-09-25: qty 100

## 2021-09-25 MED ORDER — STERILE WATER FOR INJECTION IV SOLN
INTRAVENOUS | Status: AC
  Filled 2021-09-25: qty 1000

## 2021-09-25 MED ORDER — ETOMIDATE 2 MG/ML IV SOLN
INTRAVENOUS | Status: AC
  Administered 2021-09-25: 13:00:00 20 mg via INTRAVENOUS
  Filled 2021-09-25: qty 10

## 2021-09-25 MED ORDER — MIDAZOLAM HCL 2 MG/2ML IJ SOLN: 2.0000 mg | Freq: Once | INTRAMUSCULAR | Status: AC

## 2021-09-25 MED ORDER — GLYCOPYRROLATE 1 MG PO TABS
1.0000 mg | ORAL_TABLET | Freq: Three times a day (TID) | ORAL | Status: DC
  Administered 2021-09-25 (×4): 1 mg via ORAL
  Filled 2021-09-25 (×6): qty 1

## 2021-09-25 MED ORDER — FENTANYL CITRATE PF 50 MCG/ML IJ SOSY: 100.0000 ug | PREFILLED_SYRINGE | Freq: Once | INTRAMUSCULAR | Status: AC

## 2021-09-25 MED ORDER — PANTOPRAZOLE SODIUM 40 MG IV SOLR
40.0000 mg | Freq: Every day | INTRAVENOUS | Status: DC
  Administered 2021-09-25: 15:00:00 40 mg via INTRAVENOUS
  Filled 2021-09-25: qty 40

## 2021-09-25 MED ORDER — POTASSIUM CHLORIDE 10 MEQ/50ML IV SOLN
10.0000 meq | INTRAVENOUS | Status: AC
  Administered 2021-09-25 (×2): 10 meq via INTRAVENOUS
  Filled 2021-09-25 (×2): qty 50

## 2021-09-25 MED ORDER — ROCURONIUM BROMIDE 50 MG/5ML IV SOLN
100.0000 mg | Freq: Once | INTRAVENOUS | Status: AC
  Filled 2021-09-25: qty 10

## 2021-09-25 MED ORDER — FENTANYL CITRATE PF 50 MCG/ML IJ SOSY
PREFILLED_SYRINGE | INTRAMUSCULAR | Status: AC
  Administered 2021-09-25: 13:00:00 100 ug via INTRAVENOUS
  Filled 2021-09-25: qty 2

## 2021-09-25 NOTE — Progress Notes (Addendum)
eLink Physician-Brief Progress Note Patient Name: Aaron Carlson DOB: 1933-09-28 MRN: [id number]   Date of Service  09/25/2021  HPI/Events of Note  Patient with copious secretions that he is unable to mobilize effectively, he was nasotracheally suctioned x 1 with improvement but his ICH with some cerebral edema is a relative contraindication to NT suctioning. Urinalysis reviewed.  eICU Interventions  Aggressive chest PT ordered + Robinul tablets to reduce the amount of secretions. Patient is on empiric Ceftriaxone.        Alveda Vanhorne U Shaleena Crusoe 09/25/2021, 1:16 AM

## 2021-09-25 NOTE — Procedures (Signed)
Intubation Procedure Note  Aaron Carlson  [id number]  [birth year]  Date:09/25/21  Time:2:49 PM   Provider Performing:Analeia Ismael    Procedure: Intubation ([id number])  Indication(s) Respiratory Failure  Consent Unable to obtain consent due to emergent nature of procedure.   Anesthesia Etomidate, Versed, Fentanyl, and Rocuronium   Time Out Verified patient identification, verified procedure, site/side was marked, verified correct patient position, special equipment/implants available, medications/allergies/relevant history reviewed, required imaging and test results available.   Sterile Technique Usual hand hygeine, masks, and gloves were used   Procedure Description Patient positioned in bed supine.  Sedation given as noted above.  Patient was intubated with endotracheal tube using Glidescope.  View was Grade 1 full glottis .  Number of attempts was 1.  Colorimetric CO2 detector was consistent with tracheal placement.   Complications/Tolerance None; patient tolerated the procedure well. Chest X-ray is ordered to verify placement.   EBL Minimal   Specimen(s) None  Aaron Garfinkel MD [facility] See Amion for pager  If no response to pager , please call [phone] until 7pm After 7:00 pm call Elink  [phone] 09/25/2021, 2:49 PM

## 2021-09-25 NOTE — Progress Notes (Signed)
Nutrition Follow-up  DOCUMENTATION CODES:   Non-severe (moderate) malnutrition in context of chronic illness  INTERVENTION:   Tube Feeding via Cortrak:  Vital 1.5 at 50 ml/hr Pro-Source TF 45 mL TID Provides 1920 kcals, 114 g of protein 912 mL of free water  If pt continues without BM, recommend increasing bowel regimen  NUTRITION DIAGNOSIS:   Moderate Malnutrition related to chronic illness as evidenced by mild muscle depletion, mild fat depletion.  Being addressed via TF  GOAL:   Patient will meet greater than or equal to 90% of their needs  Progressing  MONITOR:   Diet advancement, Labs, Weight trends, TF tolerance  REASON FOR ASSESSMENT:   Ventilator    ASSESSMENT:   87 yo male admitted with syncope, 3rd degree HB, became asystole and required CPR and intubation. Pt then developed acute right sided weakness/facial droop 2 days later with ICH. PMH includes CAD, CHF, HTN, HLD, DM, CKD 3/4, GERD.  12/24 Admitted, syncope, CPR 12/25 Extubated 12/26 ICH in L MCA territory 12/27 recurrent arrest, Intubated 12/28 extubated, Cortrak placed, TF initiated 12/29 Re-intubated  Today pt with respiratory distress requiring reintubation  Spoke with RN who indicates pt tolerating TF well. RN indicated pt tolerated sips of liquid this AM and diet advanced to [facility] but now NPO again with intubation. Noted pt with hx of dysphagia, on Dys 1, Honey Thick by SLP  Labs: CBGs 140-227  Meds: reviewed   Diet Order:   Diet Order             Diet NPO time specified  Diet effective now                   EDUCATION NEEDS:   Not appropriate for education at this time  Skin:  Skin Assessment: Reviewed RN Assessment  Last BM:  prior to admission  Height:   Ht Readings from Last 1 Encounters:  08/28/2021 5\' 8"  (1.727 m)    Weight:   Wt Readings from Last 1 Encounters:  09/25/21 91.7 kg     BMI:  Body mass index is 30.74 kg/m.  Estimated Nutritional Needs:    Kcal:  1900-2100 kcals  Protein:  105-120 g  Fluid:  >/= 1.9 L   Kerman Passey MS, RDN, LDN, CNSC Registered Dietitian III Clinical Nutrition RD Pager and On-Call Pager Number Located in [place]

## 2021-09-25 NOTE — Progress Notes (Signed)
NAME:  Aaron Carlson, MRN:  [id number], DOB:  July 07, 1933, LOS: 5 ADMISSION DATE:  08/30/2021, CONSULTATION DATE:  12/24 REFERRING MD:  Darl Householder, CHIEF COMPLAINT:  syncope   History of Present Illness:  Patient is encephalopathic and/or intubated. Therefore history has been obtained from chart review.   Aaron Carlson, is a 87 y.o. male, who presented to the [facility] ED with a chief complaint of syncope  They have a pertinent past medical history of CAD, combined systolic and diastolic HF, HTN, HLD, DM, CKD 3/4, GERD  Per documentation, patient had witnessed syncopal episode while at home, hit head on ground, reported unresponsive for 1 min. On arrival EMS found patient in complete heart block with rate of 30. Patient reported oriented x4. Atropine given, pacing initiated, 5mg  versed given. Per EDP patient removed pacing pads, HR decreased, and then became pulseless. CPR initiated with 3 rounds of CPR and 1 epi  He was intubated in the ED. Cardiology was consulted. CT head is pending. Trop 25. ECG: ST, RBBB, IVCD ,7.29/36/352.17.2. CXR with no infiltrate, pneumo, or effusion.   PCCM was consulted for admission.   Pertinent  Medical History  CAD, combined systolic and diastolic HF, HTN, HLD, DM, CKD 3/4, GERD  Significant Hospital Events: Including procedures, antibiotic start and stop dates in addition to other pertinent events   12/24 Syncope, EMS called, 3rd degree HB, paced, removed pads, asystole, CPR, intubated in ED. CT head> negative. 12/25 successfully extubated 12/26 developed acute right-sided weakness facial droop.  Seen by neurology.  Has 23 mm ICH in left MCA territory, SJ H and left scalp hematoma.  Heparin given for possible ACS stopped and reversed with protamine. 12/27 recurrent arrest, TVP placement  Interim History / Subjective:  Off vent but issues clearing secretions, required NTS x 1. Making urine. Family at bedside. Remains tachycardic without any pacing.  Objective    Blood pressure (!) 148/54, pulse (!) 106, temperature 97.9 F (36.6 C), temperature source Axillary, resp. rate 19, height 5\' 8"  (1.727 m), weight 91.7 kg, SpO2 99 %.    FiO2 (%):  [36 %] 36 %   Intake/Output Summary (Last 24 hours) at 09/25/2021 0904 Last data filed at 09/25/2021 0800 Gross per 24 hour  Intake 804.82 ml  Output 1150 ml  Net -345.18 ml    Filed Weights   08/31/2021 0600 09/24/21 0500 09/25/21 0600  Weight: 90.6 kg 90.6 kg 91.7 kg    Examination: Moves L>R, stable Dysarthric Tachypneic with some transmitted upper airway sounds consistent with retained glottic secretions Ext without edema Heart sounds regular, tachycardic, ext warm  BUN/Cr slightly up   Assessment & Plan:  Acute intracranial hemorrhage likely secondary to subclinical cerebral contusion at time of initial syncope.  Hemorrhaged when started on anticoagulation.  No clear evidence of underlying mass on MRI Cardiac arrest- asystole, recurrent- TVP now in place Third degree heart block- presented on admission  Syncope- suspect secondary to arrhythmia vs hypoperfusion NAGMA- suspect secondary to chronic renal disease, ongoing Acute respiratory failure with hypoxia- off vent but issues with secretion clearance likely related to stroke HX CAD Hx systolic and diastolic CHF- looks volume down if anything Probable ACS with intervention contraindicated HX HLD  HX HTN Syncope/Fall DM2 AKI on CKD3-4 stable GERD  - Work on pulmonary toileting, high risk for reintubation, benefits of NTS outweigh risks of increasing ICP (has lots of room due to cerebral atrophy) - Low dose bicarb drip, start FWF - PT/OT, OOB if EP  okay with it - Keep in ICU at least 1 more day while respiratory status so tenuous - Will get PPM if we can get respiratory status and secretion burden improved  Best Practice (right click and "Reselect all SmartList Selections" daily)   Diet/type: TF DVT prophylaxis: SCD, need to  rechallenge at some point GI prophylaxis: PPI Lines: femoral CVL/ A line Foley:  Yes, and it is still needed Code Status:  full code Last date of multidisciplinary goals of care discussion [discussed daily with family at bedside]   Patient critically ill due to resp failure Interventions to address this today HFNC titration, intubation watch Risk of deterioration without these interventions is high  I personally spent 38 minutes providing critical care not including any separately billable procedures  Erskine Emery MD [facility]  Prefer epic messenger for cross cover needs If after hours, please call E-link

## 2021-09-25 NOTE — Progress Notes (Signed)
eLink Physician-Brief Progress Note Patient Name: Aaron Carlson DOB: 12-Jan-1933 MRN: [id number]   Date of Service  09/25/2021  HPI/Events of Note  Patients needs an order for a CXR. K+ 3.5.  eICU Interventions  CXR ordered. KCL 20 meq iv over 2 hours ordered.        Kerry Kass Lyell Clugston 09/25/2021, 6:55 AM

## 2021-09-25 NOTE — Progress Notes (Signed)
Inpatient Diabetes Program Recommendations  AACE/ADA: New Consensus Statement on Inpatient Glycemic Control (2015)  Target Ranges:  Prepandial:   less than 140 mg/dL      Peak postprandial:   less than 180 mg/dL (1-2 hours)      Critically ill patients:  140 - 180 mg/dL   Lab Results  Component Value Date   GLUCAP 165 (H) 09/25/2021   HGBA1C 8.3 (H) 09/19/2021    Review of Glycemic Control  Latest Reference Range & Units 09/24/21 19:49 09/24/21 23:57 09/25/21 04:10 09/25/21 09:05  Glucose-Capillary 70 - 99 mg/dL 212 (H) 227 (H) 214 (H) 165 (H)  (H): Data is abnormally high   Current orders for Inpatient glycemic control: Novolog 0-15 units Q4H, Tube feeds at 50 ml/hr  Inpatient Diabetes Program Recommendations:    If CBGs remain elevated with tube feeds please add:  Novolog 2-3 units Q4H tube feed coverage.  Stop if feeds are held or discontinued.    Will continue to follow while inpatient.  Thank you, Reche Dixon, RN, BSN Diabetes Coordinator Inpatient Diabetes Program [phone] (team pager from 8a-5p)

## 2021-09-25 NOTE — Progress Notes (Addendum)
Electrophysiology Rounding Note  Patient Name: Aaron Carlson Date of Encounter: 09/25/2021  Primary Cardiologist: Sinclair Grooms, MD Electrophysiologist: New   Subjective   Extubated. Responds to commands, Minimal speech on our exam this am.  Having "copious secretions" requiring frequent suction.  Inpatient Medications    Scheduled Meds:   stroke: mapping our early stages of recovery book   Does not apply Once   acetaminophen  650 mg Oral Q4H   Or   acetaminophen (TYLENOL) oral liquid 160 mg/5 mL  650 mg Per Tube Q4H   Or   acetaminophen  650 mg Rectal Q4H   atorvastatin  40 mg Per Tube Daily   chlorhexidine gluconate (MEDLINE KIT)  15 mL Mouth Rinse BID   Chlorhexidine Gluconate Cloth  6 each Topical Daily   docusate  100 mg Per Tube BID   feeding supplement (PROSource TF)  45 mL Per Tube TID   fentaNYL (SUBLIMAZE) injection  25 mcg Intravenous Once   fentaNYL (SUBLIMAZE) injection  25 mcg Intravenous Once   glycopyrrolate  1 mg Oral TID   insulin aspart  0-15 Units Subcutaneous Q4H   mouth rinse  15 mL Mouth Rinse 10 times per day   pantoprazole sodium  40 mg Per Tube QHS   polyethylene glycol  17 g Per Tube Daily   senna-docusate  1 tablet Per Tube BID   sodium chloride flush  3 mL Intravenous Q12H   Continuous Infusions:  sodium chloride Stopped (09/21/21 1539)   sodium chloride Stopped (08/29/2021 0813)   sodium chloride     cefTRIAXone (ROCEPHIN)  IV Stopped (09/24/21 1152)   epinephrine 5 mcg/min (09/04/2021 0931)   feeding supplement (VITAL 1.5 CAL) 20 mL/hr at 09/24/21 1900   fentaNYL infusion INTRAVENOUS 50 mcg/hr (09/25/2021 1305)   norepinephrine (LEVOPHED) Adult infusion Stopped (09/24/21 1721)   potassium chloride     promethazine (PHENERGAN) injection (IM or IVPB)     PRN Meds: sodium chloride, acetaminophen **OR** acetaminophen (TYLENOL) oral liquid 160 mg/5 mL **OR** acetaminophen, etomidate, fentaNYL, gadobutrol, guaiFENesin, hydrALAZINE,  midazolam, midazolam, ondansetron (ZOFRAN) IV, sodium chloride flush   Vital Signs    Vitals:   09/25/21 0400 09/25/21 0412 09/25/21 0500 09/25/21 0600  BP:      Pulse: (!) 106  (!) 114 (!) 108  Resp: (!) 31  (!) 34 (!) 43  Temp:  97.9 F (36.6 C)    TempSrc:  Axillary    SpO2: 98%  97% 97%  Weight:    91.7 kg  Height:        Intake/Output Summary (Last 24 hours) at 09/25/2021 0706 Last data filed at 09/24/2021 1800 Gross per 24 hour  Intake 604.7 ml  Output 1100 ml  Net -495.3 ml   Filed Weights   09/22/2021 0600 09/24/21 0500 09/25/21 0600  Weight: 90.6 kg 90.6 kg 91.7 kg    Physical Exam    GEN- The patient is elderly and acutely ill appearing appearing, alert to person and nods to questioning and follows commands.  Eyes-  Sclera clear, conjunctiva pink Ears- hearing intact Oropharynx- clear Neck- supple Lungs- Crackles throughout.  Heart- Regular slightly tachy rate and rhythm, no murmurs, rubs or gallops GI- soft, NT, ND, + BS Extremities- no clubbing or cyanosis. No edema Skin- no rash or lesion Psych- flat affect  Labs    CBC Recent Labs    09/24/21 0428 09/25/21 0423  WBC 12.3* 11.5*  HGB 9.3* 8.6*  HCT 27.4* 27.7*  MCV 90.7 95.2  PLT 157 102*   Basic Metabolic Panel Recent Labs    09/24/21 0428 09/24/21 1515 09/25/21 0423  NA 145  --  144  K 3.5  --  3.5  CL 118*  --  120*  CO2 17*  --  15*  GLUCOSE 152*  --  255*  BUN 43*  --  46*  CREATININE 2.05*  --  2.21*  CALCIUM 8.2*  --  8.3*  MG 2.1 2.2 2.3  PHOS 1.4* 4.2 3.2   Liver Function Tests No results for input(s): AST, ALT, ALKPHOS, BILITOT, PROT, ALBUMIN in the last 72 hours. No results for input(s): LIPASE, AMYLASE in the last 72 hours. Cardiac Enzymes No results for input(s): CKTOTAL, CKMB, CKMBINDEX, TROPONINI in the last 72 hours.   Telemetry    Sinus 100-110s mostly, 120s at times. No pacing. (personally reviewed)  Radiology    CT HEAD WO CONTRAST (5MM)  Result  Date: 09/05/2021 CLINICAL DATA:  Stroke, follow-up. EXAM: CT HEAD WITHOUT CONTRAST TECHNIQUE: Contiguous axial images were obtained from the base of the skull through the vertex without intravenous contrast. COMPARISON:  Head CT from earlier same day at 5:58 a.m. FINDINGS: Brain: Stable size and configuration of the acute parenchymal hematoma within the LEFT frontoparietal lobe, again measuring 4 x 2.9 cm. Surrounding parenchymal edema is grossly stable in extent. No evidence of a significant midline shift or herniation related to this hemorrhage and edema. There is generalized age related parenchymal volume loss with commensurate dilatation of the ventricles and sulci. No new parenchymal hemorrhage or edema. Vascular: Chronic calcified atherosclerotic changes of the large vessels at the skull base. No unexpected hyperdense vessel. Skull: Normal. Negative for fracture or focal lesion. Sinuses/Orbits: No acute finding. Other: None. IMPRESSION: 1. Stable size and configuration of the acute parenchymal hemorrhage within the LEFT frontoparietal lobe, again measuring 4 x 2.9 cm. Surrounding parenchymal edema is also grossly stable in extent. 2. No new parenchymal hemorrhage or edema. No midline shift or herniation. Electronically Signed   By: Franki Cabot M.D.   On: 08/28/2021 16:27   CARDIAC CATHETERIZATION  Result Date: 08/30/2021 Bradycardia with cardiac arrest. Pacing wire repositioned into the RV His current heart rate is 130 sinus tachycardia. Pacing set at backup of 40 bpm.   CARDIAC CATHETERIZATION  Result Date: 09/11/2021 Bradycardia, cardiac arrest 2.   Successful placement temporary transvenous pacing wire from the left femoral vein.   DG Chest Port 1 View  Result Date: 09/24/2021 CLINICAL DATA:  Intubated.  Status post cardiac arrest. EXAM: PORTABLE CHEST 1 VIEW COMPARISON:  Yesterday. FINDINGS: Endotracheal tube in satisfactory position. Nasogastric tube side hole in the proximal stomach  with the tip not included. Stable borderline enlarged cardiac silhouette. Increased left lower lobe atelectasis. Clear right lung. Thoracic spine degenerative changes. IMPRESSION: Increased left lower lobe atelectasis. Electronically Signed   By: Claudie Revering M.D.   On: 09/24/2021 09:03   DG Chest Port 1 View  Result Date: 09/02/2021 CLINICAL DATA:  ET/NG tube placement EXAM: PORTABLE CHEST 1 VIEW COMPARISON:  Chest x-ray dated September 20, 2021 FINDINGS: ET tube is unchanged in position. OG tube tip is in the stomach with side port near the GE junction. Mild bilateral heterogeneous pulmonary opacities, similar to prior exam. No large pleural effusion or evidence of pneumothorax. Cardiac and mediastinal contours are unchanged. IMPRESSION: 1. OG tube tip is in the stomach with side port near the GE junction, consider advancement for optimal positioning. 2. Mild  bilateral heterogeneous pulmonary opacities, likely a combination of atelectasis and pulmonary edema. Radiology ilia speaking Electronically Signed   By: Yetta Glassman M.D.   On: 09/15/2021 09:07   DG Abd Portable 1V  Result Date: 09/24/2021 CLINICAL DATA:  Feeding tube placement EXAM: PORTABLE ABDOMEN - 1 VIEW COMPARISON:  None. FINDINGS: Feeding tube tip projects over the expected area of the stomach. Retained intraluminal contrast noted in the colon. No gas-filled dilated loops of bowel seen in the partially visualized abdomen and pelvis. Right femoral line. Left femoral vein approach trans venous pacing wire partially visualized. IMPRESSION: Feeding tube tip projects over the expected area of the stomach. Electronically Signed   By: Yetta Glassman M.D.   On: 09/24/2021 12:10   VAS US CAROTID  Result Date: 09/01/2021 Carotid Arterial Duplex Study Patient Name:  Aaron Carlson  Date of Exam:   09/15/2021 Medical Rec #: [id number]        Accession #:    [id number] Date of Birth: 21-Aug-1933        Patient Gender: M Patient Age:   71 years  Exam Location:  [facility] Procedure:      VAS US CAROTID Referring Phys: Cornelius Moras XU --------------------------------------------------------------------------------  Indications:      CVA. Risk Factors:     Hypertension, hyperlipidemia, Diabetes, coronary artery                   disease. Comparison Study: No prior study Performing Technologist: Maudry Mayhew MHA, RDMS, RVT, RDCS  Examination Guidelines: A complete evaluation includes B-mode imaging, spectral Doppler, color Doppler, and power Doppler as needed of all accessible portions of each vessel. Bilateral testing is considered an integral part of a complete examination. Limited examinations for reoccurring indications may be performed as noted.  Right Carotid Findings: +----------+--------+--------+--------+-----------------------+--------+             PSV cm/s EDV cm/s Stenosis Plaque Description      Comments  +----------+--------+--------+--------+-----------------------+--------+  CCA Prox   60       14                                                  +----------+--------+--------+--------+-----------------------+--------+  CCA Distal 47       13                                                  +----------+--------+--------+--------+-----------------------+--------+  ICA Prox   39       14                smooth and heterogenous           +----------+--------+--------+--------+-----------------------+--------+  ICA Distal 60       21                                                  +----------+--------+--------+--------+-----------------------+--------+  ECA        62       6                 smooth  and heterogenous           +----------+--------+--------+--------+-----------------------+--------+ +----------+--------+-------+----------------+-------------------+             PSV cm/s EDV cms Describe         Arm Pressure (mmHG)  +----------+--------+-------+----------------+-------------------+  Subclavian 57               Multiphasic, WNL                       +----------+--------+-------+----------------+-------------------+ +---------+--------+--+--------+--+---------+  Vertebral PSV cm/s 57 EDV cm/s 12 Antegrade  +---------+--------+--+--------+--+---------+  Left Carotid Findings: +----------+-------+--------+--------+-----------------------+-----------------+             PSV     EDV cm/s Stenosis Plaque Description      Comments                       cm/s                                                                 +----------+-------+--------+--------+-----------------------+-----------------+  CCA Prox   69      18                                                           +----------+-------+--------+--------+-----------------------+-----------------+  CCA Distal 69      21                                        intimal                                                                          thickening         +----------+-------+--------+--------+-----------------------+-----------------+  ICA Prox   47      13                heterogenous and                                                                 calcific                                   +----------+-------+--------+--------+-----------------------+-----------------+  ICA Distal 91      40                                                           +----------+-------+--------+--------+-----------------------+-----------------+  ECA        41      5                 heterogenous and                                                                 calcific                                   +----------+-------+--------+--------+-----------------------+-----------------+ +----------+--------+--------+----------------+-------------------+             PSV cm/s EDV cm/s Describe         Arm Pressure (mmHG)  +----------+--------+--------+----------------+-------------------+  Subclavian 60                Multiphasic, WNL                       +----------+--------+--------+----------------+-------------------+ +---------+--------+--+--------+--+---------+  Vertebral PSV cm/s 69 EDV cm/s 21 Antegrade  +---------+--------+--+--------+--+---------+   Summary: Right Carotid: Velocities in the right ICA are consistent with a 1-39% stenosis. Left Carotid: Velocities in the left ICA are consistent with a 1-39% stenosis. Vertebrals:  Bilateral vertebral arteries demonstrate antegrade flow. Subclavians: Normal flow hemodynamics were seen in bilateral subclavian              arteries. *See table(s) above for measurements and observations.     Preliminary     Patient Profile     Aaron Carlson is a 87 y.o. male with a history of CAD, combined systolic and diastolic HF, HTN, HLD, DM, CKD 3/4, and GERD who is being seen today for the evaluation of asystole and CHB at the request of Dr. Ellyn Hack.  Assessment & Plan    Transient complete heart block Suspect secondary to hypervagotonia in the setting of acute stroke Now with stable rhythm and with stable temp wire.  Will follow for improvement Feeding tube in place, copious secretions and slow movements remain this am. He needs continued neurological recovery prior to pacing, specifically re: secretions and lying flat.    2. ?CAD HS trop on admission elevated at 7052. Pt was not complaining of chest pain. ? If due to episode of HB with CPR LHC will likely be deferred this admission in setting of intracranial hemorrhage.  Complete Echo as below with normal EF and no WMA    3. Chronic diastolic heart failure: Echo 09/21/2021 LVEF 50-55% with no regional wall abnormalities.    4. Hypokalemia 3.5 this am  5. Acute hypoxic respiratory failure Extubated again 12/28.  Copious secretions requiring very frequent use of pulmonary toilet on our exam this am.   We will continue to follow along for procedural readiness.  For questions or updates, please contact [place] Please consult  [email or web address] for contact info under Cardiology/STEMI.  Signed, Shirley Friar, PA-C  09/25/2021, 7:06 AM

## 2021-09-25 NOTE — Plan of Care (Signed)
°  Problem: Clinical Measurements: Goal: Respiratory complications will improve Outcome: Not Progressing   Problem: Clinical Measurements: Goal: Cardiovascular complication will be avoided Outcome: Not Progressing   Problem: Clinical Measurements: Goal: Diagnostic test results will improve Outcome: Progressing   Problem: Nutrition: Goal: Adequate nutrition will be maintained Outcome: Progressing   Problem: Activity: Goal: Risk for activity intolerance will decrease Outcome: Progressing

## 2021-09-25 NOTE — Progress Notes (Signed)
Pt w/increased WOB and audible rales. SpO2 was difficult to obtain. Dr. Kimber Relic elected to intubate pt. 1300 - Time out performed.  Dr. Marvis Repress (NP), Marlyne Beards (RT), myself, and Benjamine Mola (RN) were present. 1305 - Pt intubated without difficulty.  CXR ordered.

## 2021-09-25 NOTE — Progress Notes (Signed)
Referred to bedside for respiratory distress  On arrival pt with gurgling respirations, RR 40s-50s. D/w pt and pt wife, will proceed with reintubation  See separate procedure note    Eliseo Gum MSN, AGACNP-BC [place] 09/25/2021, 1:20 PM

## 2021-09-25 NOTE — Progress Notes (Signed)
Pharmacy Antibiotic Note  Aaron Carlson is a 87 y.o. male admitted on 09/05/2021 with syncope and CHB. Pt has had ongoing respiratory distress requiring reintubation earlier today. Pt has received ceftriaxone x3 days, now pharmacy has been consulted for vancomycin and cefepime dosing. MRSA PCR negative, sputum pending, urine with enterococcus.  Plan: Cefepime 2g IV q24h Vancomycin 1500mg  IV q48h - est AUC 480 Follow Cx, renal function  Height: 5\' 8"  (172.7 cm) Weight: 91.7 kg (202 lb 2.6 oz) IBW/kg (Calculated) : 68.4  Temp (24hrs), Avg:98.7 F (37.1 C), Min:97.7 F (36.5 C), Max:99.7 F (37.6 C)  Recent Labs  Lab 09/22/2021 2244 09/21/21 0807 09/22/21 0133 09/24/2021 0036 09/24/21 0428 09/25/21 0423  WBC  --  12.7* 13.2* 13.6* 12.3* 11.5*  CREATININE  --  1.94* 1.97* 1.74* 2.05* 2.21*  LATICACIDVEN 1.6 1.7  --   --   --   --     Estimated Creatinine Clearance: 26.4 mL/min (A) (by C-G formula based on SCr of 2.21 mg/dL (H)).    Allergies  Allergen Reactions   Penicillins Rash    Antimicrobials this admission: Ceftriaxone 12/27 >> 12/29 Vancomycin 12/29 >>  Cefepime 12/29 >>  Dose adjustments this admission: N/a  Microbiology results: 12/24 MRSA PCR: negative 12/28 UCx: enterococcus  Thank you for allowing pharmacy to be a part of this patients care.  Einar Grad 09/25/2021 3:38 PM

## 2021-09-25 NOTE — Progress Notes (Addendum)
Updated family on setbacks today, hopefully can avoid trach  Erskine Emery MD PCCM   Addendum  Granddaughter called upset that patient got soup and ice chips given recommendations from SLP.  MBSS reviewed from 12/26.   We can I guess keep patient more strict NPO after next extubation trial w/ repeat speech eval but think this is more just general saliva management in setting of stroke and recurrent cardiac arrests.  Everyone updated that if he fails next trial despite being strict NPO he will need tracheostomy.  Erskine Emery MD PCCM

## 2021-09-26 ENCOUNTER — Inpatient Hospital Stay (HOSPITAL_COMMUNITY): Payer: Medicare HMO

## 2021-09-26 DIAGNOSIS — I469 Cardiac arrest, cause unspecified: Secondary | ICD-10-CM | POA: Diagnosis not present

## 2021-09-26 DIAGNOSIS — I442 Atrioventricular block, complete: Secondary | ICD-10-CM | POA: Diagnosis not present

## 2021-09-26 LAB — GLUCOSE, CAPILLARY: Glucose-Capillary: 290 mg/dL — ABNORMAL HIGH (ref 70–99)

## 2021-09-26 LAB — CBC
HCT: 29.2 % — ABNORMAL LOW (ref 39.0–52.0)
Hemoglobin: 8.8 g/dL — ABNORMAL LOW (ref 13.0–17.0)
MCH: 29.6 pg (ref 26.0–34.0)
MCHC: 30.1 g/dL (ref 30.0–36.0)
MCV: 98.3 fL (ref 80.0–100.0)
Platelets: 163 10*3/uL (ref 150–400)
RBC: 2.97 MIL/uL — ABNORMAL LOW (ref 4.22–5.81)
RDW: 16 % — ABNORMAL HIGH (ref 11.5–15.5)
WBC: 17.4 10*3/uL — ABNORMAL HIGH (ref 4.0–10.5)
nRBC: 1.8 % — ABNORMAL HIGH (ref 0.0–0.2)

## 2021-09-26 LAB — URINE CULTURE: Culture: >=100000 — AB

## 2021-09-26 LAB — BASIC METABOLIC PANEL
Anion gap: 13 (ref 5–15)
BUN: 61 mg/dL — ABNORMAL HIGH (ref 8–23)
CO2: 12 mmol/L — ABNORMAL LOW (ref 22–32)
Calcium: 7.9 mg/dL — ABNORMAL LOW (ref 8.9–10.3)
Chloride: 116 mmol/L — ABNORMAL HIGH (ref 98–111)
Creatinine, Ser: 3.12 mg/dL — ABNORMAL HIGH (ref 0.61–1.24)
GFR, Estimated: 19 mL/min — ABNORMAL LOW (ref >60)
Glucose, Bld: 356 mg/dL — ABNORMAL HIGH (ref 70–99)
Potassium: 5 mmol/L (ref 3.5–5.1)
Sodium: 141 mmol/L (ref 135–145)

## 2021-09-26 LAB — TRIGLYCERIDES: Triglycerides: 100 mg/dL (ref <150)

## 2021-09-26 LAB — MAGNESIUM: Magnesium: 2.4 mg/dL (ref 1.7–2.4)

## 2021-09-26 LAB — PHOSPHORUS: Phosphorus: 4.9 mg/dL — ABNORMAL HIGH (ref 2.5–4.6)

## 2021-09-26 MED ORDER — STERILE WATER FOR INJECTION IV SOLN
INTRAVENOUS | Status: DC
  Filled 2021-09-26: qty 1000

## 2021-09-26 MED ORDER — EPINEPHRINE 1 MG/10ML IJ SOSY
PREFILLED_SYRINGE | INTRAMUSCULAR | Status: AC
  Filled 2021-09-26: qty 10

## 2021-09-26 MED ORDER — FENTANYL 2500MCG IN NS 250ML (10MCG/ML) PREMIX INFUSION
25.0000 ug/h | INTRAVENOUS | Status: DC
  Administered 2021-09-26: 08:00:00 25 ug/h via INTRAVENOUS
  Filled 2021-09-26: qty 250

## 2021-09-26 MED ORDER — FENTANYL CITRATE PF 50 MCG/ML IJ SOSY: 25.0000 ug | PREFILLED_SYRINGE | Freq: Once | INTRAMUSCULAR | Status: DC

## 2021-09-26 MED ORDER — FENTANYL BOLUS VIA INFUSION
25.0000 ug | INTRAVENOUS | Status: DC | PRN
Start: 2021-09-26 — End: 2021-09-26
  Filled 2021-09-26: qty 100

## 2021-09-26 MED ORDER — SODIUM BICARBONATE 8.4 % IV SOLN
50.0000 meq | Freq: Once | INTRAVENOUS | Status: AC
  Administered 2021-09-26: 08:00:00 50 meq via INTRAVENOUS
  Filled 2021-09-26: qty 50

## 2021-09-26 MED ORDER — INSULIN DETEMIR 100 UNIT/ML ~~LOC~~ SOLN
15.0000 [IU] | Freq: Two times a day (BID) | SUBCUTANEOUS | Status: DC
  Filled 2021-09-26 (×2): qty 0.15

## 2021-09-26 MED ORDER — PANTOPRAZOLE 2 MG/ML SUSPENSION
40.0000 mg | Freq: Every day | ORAL | Status: DC
  Filled 2021-09-26: qty 20

## 2021-09-26 MED ORDER — EPINEPHRINE HCL 5 MG/250ML IV SOLN IN NS
0.5000 ug/min | INTRAVENOUS | Status: DC
  Administered 2021-09-26: 1 ug/min via INTRAVENOUS
  Filled 2021-09-26: qty 250

## 2021-09-26 MED ORDER — VASOPRESSIN 20 UNITS/100 ML INFUSION FOR SHOCK
0.0400 [IU]/min | INTRAVENOUS | Status: DC
  Administered 2021-09-26: 08:00:00 0.04 [IU]/min via INTRAVENOUS
  Filled 2021-09-26: qty 100

## 2021-09-28 NOTE — Procedures (Signed)
Cardiopulmonary Resuscitation Note  Rapidly escalating pressor requirements through morning; bedside echo w/o pericardial effusion, EF < 5%.  Had bilateral breath sounds.  BP unresponsive to multiple pushes of bicarb/calcium.  Initiated CPR x 12 minutes without ROSC and stopped due to futility.  Family at bedside during event.   Aaron Carlson  [id number]  07-18-1933  Date:2021-09-29  Time:8:38 AM   Provider Performing:Jacari Iannello Loletha Grayer Tamala Julian   Procedure: Cardiopulmonary Resuscitation [phone])  Indication(s) Loss of Pulse  Consent N/A  Anesthesia N/A   Time Out N/A   Sterile Technique Hand hygiene, gloves   Procedure Description Called to patient's room for CODE BLUE. Initial rhythm was PEA/Asystole. Patient received high quality chest compressions for 12 minutes with defibrillation or cardioversion when appropriate. Epinephrine was administered every 3 minutes as directed by time Therapist, nutritional. Additional pharmacologic interventions included calcium chloride and sodium bicarbonate. Return of spontaneous circulation was not achieved.  Family at bedside.   Complications/Tolerance N/A   EBL N/A   Specimen(s) N/A  Estimated time to ROSC: N/A

## 2021-09-28 NOTE — Death Summary Note (Signed)
DEATH SUMMARY   Patient Details  Name: Aaron Carlson MRN: [id number] DOB: 04-20-1933  Admission/Discharge Information   Admit Date:  09/21/21  Date of Death: Date of Death: 2021-09-27  Time of Death: Time of Death: 0833  Length of Stay: 2022/12/03  Referring Physician: Antony Contras, MD   Reason(s) for Hospitalization  Out of hospital cardiac arrest Sick sinus syndrome with recurrent asystole Likely acute coronary syndrome Intracranial hemorrhage likely traumatic Acute on chronic renal failure Recurrent aspiration pneumonia  Brief Hospital Course (including significant findings, care, treatment, and services provided and events leading to death)  Aaron Carlson is a 88 y.o. male with PMH significant for CAD, DM2, HTN, HLD, Prostrate Cancer who is admitted with synope and unresponsive was found in the field in heart block with rate in 30s, then went into PEA Arrest. CPR x 3 mins with 1 of epi with ROSC.  Sequence of daily events:  2023/09/22 Syncope, EMS called, 3rd degree HB, paced, removed pads, asystole, CPR, intubated in ED. CT head> negative. 12/25 successfully extubated 12/26 developed acute right-sided weakness facial droop.  Seen by neurology.  Has 23 mm ICH in left MCA territory, SJ H and left scalp hematoma.  Heparin given for possible ACS stopped and reversed with protamine. 12/27 recurrent arrest, intubation, transvenous placement, CT head stable 12/28 extubation 12/29 worsening resp status, reintubated  On 2023-09-28 AM, patient became suddenly profoundly hypotensive despite his paced rhythm.   Bedside echo w/o pericardial effusion, LVEF < 5%.  Had bilateral breath sounds.  BP unresponsive to multiple pushes of bicarb/calcium.  Went into PEA arrest and could not be resuscitated.  Suspicion for recurrent ACS.  Pertinent Labs and Studies  Significant Diagnostic Studies CT HEAD WO CONTRAST (5MM)  Result Date: 09/10/2021 CLINICAL DATA:  Stroke, follow-up. EXAM: CT HEAD WITHOUT  CONTRAST TECHNIQUE: Contiguous axial images were obtained from the base of the skull through the vertex without intravenous contrast. COMPARISON:  Head CT from earlier same day at 5:58 a.m. FINDINGS: Brain: Stable size and configuration of the acute parenchymal hematoma within the LEFT frontoparietal lobe, again measuring 4 x 2.9 cm. Surrounding parenchymal edema is grossly stable in extent. No evidence of a significant midline shift or herniation related to this hemorrhage and edema. There is generalized age related parenchymal volume loss with commensurate dilatation of the ventricles and sulci. No new parenchymal hemorrhage or edema. Vascular: Chronic calcified atherosclerotic changes of the large vessels at the skull base. No unexpected hyperdense vessel. Skull: Normal. Negative for fracture or focal lesion. Sinuses/Orbits: No acute finding. Other: None. IMPRESSION: 1. Stable size and configuration of the acute parenchymal hemorrhage within the LEFT frontoparietal lobe, again measuring 4 x 2.9 cm. Surrounding parenchymal edema is also grossly stable in extent. 2. No new parenchymal hemorrhage or edema. No midline shift or herniation. Electronically Signed   By: Aaron Carlson M.D.   On: 09/22/2021 16:27   CT HEAD WO CONTRAST (5MM)  Result Date: 09/17/2021 CLINICAL DATA:  Hemorrhagic stroke follow-up EXAM: CT HEAD WITHOUT CONTRAST TECHNIQUE: Contiguous axial images were obtained from the base of the skull through the vertex without intravenous contrast. COMPARISON:  Yesterday FINDINGS: Brain: 29 x 40 mm hematoma the left frontal parietal junction with regional low-density swelling that is mildly progressed. Anteriorly is a low-density space that has filled in with blood. Still no midline shift of the atrophic brain. Small volume local subarachnoid hemorrhage. No hydrocephalus. Vascular: Atheromatous calcification. Skull: Negative for fracture.  Left parietal scalp swelling Sinuses/Orbits:  Negative IMPRESSION:  Mildly increased edema around the left cerebral hemorrhage which is not significantly increased in size. Electronically Signed   By: Aaron Carlson M.D.   On: 09/15/2021 06:31   CT HEAD WO CONTRAST (5MM)  Result Date: 09/04/2021 CLINICAL DATA:  Trauma. EXAM: CT HEAD WITHOUT CONTRAST CT CERVICAL SPINE WITHOUT CONTRAST TECHNIQUE: Multidetector CT imaging of the head and cervical spine was performed following the standard protocol without intravenous contrast. Multiplanar CT image reconstructions of the cervical spine were also generated. COMPARISON:  CT dated 09/19/2011. FINDINGS: CT HEAD FINDINGS Brain: Moderate age-related atrophy and chronic microvascular ischemic changes. There is no acute intracranial hemorrhage. No mass effect or midline shift. No extra-axial fluid collection. Vascular: No hyperdense vessel or unexpected calcification. Skull: Normal. Negative for fracture or focal lesion. Sinuses/Orbits: The visualized paranasal sinuses and mastoid air cells are clear. Other: Left posterior scalp contusion. CT CERVICAL SPINE FINDINGS Alignment: No acute subluxation. Skull base and vertebrae: No acute fracture. Soft tissues and spinal canal: No prevertebral fluid or swelling. No visible canal hematoma. Disc levels: Multilevel degenerative changes with disc space narrowing and endplate irregularity and spurring. Upper chest: Partially visualized bilateral upper lobe and biapical pulmonary densities which may represent edema, pneumonia, or contusion. Other: An endotracheal tube and an enteric tube are partially visualized. Bilateral carotid bulb calcified plaques. IMPRESSION: 1. No acute intracranial pathology. Moderate age-related atrophy and chronic microvascular ischemic changes. 2. No acute/traumatic cervical spine pathology. Multilevel degenerative changes. 3. Partially visualized bilateral upper lobe and biapical pulmonary densities which may represent edema, pneumonia, or contusion. Electronically  Signed   By: Aaron Carlson M.D.   On: 09/13/2021 21:42   CT Cervical Spine Wo Contrast  Result Date: 09/19/2021 CLINICAL DATA:  Trauma. EXAM: CT HEAD WITHOUT CONTRAST CT CERVICAL SPINE WITHOUT CONTRAST TECHNIQUE: Multidetector CT imaging of the head and cervical spine was performed following the standard protocol without intravenous contrast. Multiplanar CT image reconstructions of the cervical spine were also generated. COMPARISON:  CT dated 09/19/2011. FINDINGS: CT HEAD FINDINGS Brain: Moderate age-related atrophy and chronic microvascular ischemic changes. There is no acute intracranial hemorrhage. No mass effect or midline shift. No extra-axial fluid collection. Vascular: No hyperdense vessel or unexpected calcification. Skull: Normal. Negative for fracture or focal lesion. Sinuses/Orbits: The visualized paranasal sinuses and mastoid air cells are clear. Other: Left posterior scalp contusion. CT CERVICAL SPINE FINDINGS Alignment: No acute subluxation. Skull base and vertebrae: No acute fracture. Soft tissues and spinal canal: No prevertebral fluid or swelling. No visible canal hematoma. Disc levels: Multilevel degenerative changes with disc space narrowing and endplate irregularity and spurring. Upper chest: Partially visualized bilateral upper lobe and biapical pulmonary densities which may represent edema, pneumonia, or contusion. Other: An endotracheal tube and an enteric tube are partially visualized. Bilateral carotid bulb calcified plaques. IMPRESSION: 1. No acute intracranial pathology. Moderate age-related atrophy and chronic microvascular ischemic changes. 2. No acute/traumatic cervical spine pathology. Multilevel degenerative changes. 3. Partially visualized bilateral upper lobe and biapical pulmonary densities which may represent edema, pneumonia, or contusion. Electronically Signed   By: Aaron Carlson M.D.   On: 09/09/2021 21:42   MR BRAIN W WO CONTRAST  Result Date:  09/22/2021 CLINICAL DATA:  Stroke. Follow-up intracranial hemorrhage. Assess for underlying lesion. EXAM: MRI HEAD WITHOUT AND WITH CONTRAST TECHNIQUE: Multiplanar, multiecho pulse sequences of the brain and surrounding structures were obtained without and with intravenous contrast. CONTRAST:  25mL GADAVIST GADOBUTROL 1 MMOL/ML IV SOLN COMPARISON:  09/25/2021.  CT earlier same day.  FINDINGS: Brain: The study suffers from considerable motion degradation. No focal abnormality is seen affecting the brainstem or cerebellum. Right cerebral hemisphere shows mild chronic small-vessel ischemic changes of the white matter. Left cerebral hemisphere shows a 4.2 x 3 cm in diameter hematoma at the left frontoparietal junction with surrounding edema. After contrast administration, there are a few punctate foci of contrast enhancement or accumulation along the posterior wall. The possibility of ongoing bleeding does exist, and it may be prudent to follow this with an additional CT in a few hours to make sure the hematoma is not enlarging further. There is no clear underlying mass lesion, but that is not completely excluded and should be evaluated subsequently. There is no midline shift. No hydrocephalus. No extra-axial collection. Vascular: Major vessels at the base of the brain show flow. Skull and upper cervical spine: Negative Sinuses/Orbits: Clear/normal Other: None IMPRESSION: Considerable motion degradation. Redemonstration of an intraparenchymal hematoma at the left frontoparietal junction measuring approximately 3 x 4.2 cm as seen previously. Surrounding edema. Question of punctate enhancement or contrast accumulation along the posterior wall. This could represent slow ongoing bleeding, and I would suggest repeat CT evaluation in several hours to make sure that the hematoma is not enlarging further. At this point, we have not diagnose an underlying mass lesion, but the patient should be restudied after recovery from the  acute phase. Electronically Signed   By: Nelson Chimes M.D.   On: 09/22/2021 11:57   CARDIAC CATHETERIZATION  Result Date: 09/25/2021 Bradycardia with cardiac arrest. Pacing wire repositioned into the RV His current heart rate is 130 sinus tachycardia. Pacing set at backup of 40 bpm.   CARDIAC CATHETERIZATION  Result Date: 09/13/2021 Bradycardia, cardiac arrest 2.   Successful placement temporary transvenous pacing wire from the left femoral vein.   DG CHEST PORT 1 VIEW  Result Date: 09/25/2021 CLINICAL DATA:  Encounter for intubation. EXAM: PORTABLE CHEST 1 VIEW COMPARISON:  Chest radiograph 09/25/2021 at 7:07 a.m. FINDINGS: An endotracheal tube has been placed and terminates 2 cm above the carina. A feeding tube courses into the abdomen with tip not imaged. A pacemaker lead remains in place, projecting over the right ventricle from an inferior approach. The cardiomediastinal silhouette is unchanged. Widespread airspace opacities in both lungs are unchanged. No large pleural effusion or pneumothorax is identified. IMPRESSION: Interval intubation. Unchanged extensive bilateral lung opacities compatible with pneumonia. Electronically Signed   By: Logan Bores M.D.   On: 09/25/2021 14:27   DG Chest Port 1 View  Result Date: 09/25/2021 CLINICAL DATA:  88 year old male with history of acute respiratory failure. EXAM: PORTABLE CHEST 1 VIEW COMPARISON:  Chest x-ray 09/24/2021. FINDINGS: A feeding tube is seen extending into the abdomen, however, the tip of the feeding tube extends below the lower margin of the image. Pacemaker lead projecting over the expected location of the right ventricular apex, presumably via transfemoral approach. Severe multifocal interstitial and airspace disease is now noted throughout the lungs bilaterally, most evident throughout the mid to lower lungs. Possible small left pleural effusion. No definite right pleural effusion. No pneumothorax. Pulmonary vasculature is  obscured. Heart size is borderline enlarged. Upper mediastinal contours are within normal limits. Atherosclerotic calcifications are noted in the thoracic aorta. IMPRESSION: 1. Severe multilobar bilateral pneumonia with small left pleural effusion. 2. Aortic atherosclerosis. 3. Support apparatus, as above. Electronically Signed   By: Vinnie Langton M.D.   On: 09/25/2021 07:20   DG Chest Port 1 View  Result Date: 09/24/2021  CLINICAL DATA:  Intubated.  Status post cardiac arrest. EXAM: PORTABLE CHEST 1 VIEW COMPARISON:  Yesterday. FINDINGS: Endotracheal tube in satisfactory position. Nasogastric tube side hole in the proximal stomach with the tip not included. Stable borderline enlarged cardiac silhouette. Increased left lower lobe atelectasis. Clear right lung. Thoracic spine degenerative changes. IMPRESSION: Increased left lower lobe atelectasis. Electronically Signed   By: Claudie Revering M.D.   On: 09/24/2021 09:03   DG Chest Port 1 View  Result Date: 09/13/2021 CLINICAL DATA:  ET/NG tube placement EXAM: PORTABLE CHEST 1 VIEW COMPARISON:  Chest x-ray dated September 20, 2021 FINDINGS: ET tube is unchanged in position. OG tube tip is in the stomach with side port near the GE junction. Mild bilateral heterogeneous pulmonary opacities, similar to prior exam. No large pleural effusion or evidence of pneumothorax. Cardiac and mediastinal contours are unchanged. IMPRESSION: 1. OG tube tip is in the stomach with side port near the GE junction, consider advancement for optimal positioning. 2. Mild bilateral heterogeneous pulmonary opacities, likely a combination of atelectasis and pulmonary edema. Radiology ilia speaking Electronically Signed   By: Yetta Glassman M.D.   On: 09/22/2021 09:07   DG Chest Port 1 View  Result Date: 09/27/2021 CLINICAL DATA:  Endotracheal tube and nasogastric tube placement. Cardiac arrest. EXAM: PORTABLE CHEST 1 VIEW COMPARISON:  April 14, 2011 FINDINGS: Endotracheal tube  projects 6.2 cm above the carina. Nasogastric tube courses below the diaphragm with tip excluded by collimation. Cardiac defibrillator pads overlie the left chest and upper abdomen. The heart size and mediastinal contours are partially obscured but appear within normal limits. Left lung field is partially obscured by overlying defibrillator lead. Right lung appears clear. No visible pleural effusion or pneumothorax. The visualized skeletal structures are unremarkable. IMPRESSION: 1. Endotracheal tube projects 6.2 cm above the carina. 2. Nasogastric tube courses below the diaphragm with tip excluded by collimation. Electronically Signed   By: Dahlia Bailiff M.D.   On: 09/06/2021 18:42   DG Abd Portable 1V  Result Date: 09/24/2021 CLINICAL DATA:  Feeding tube placement EXAM: PORTABLE ABDOMEN - 1 VIEW COMPARISON:  None. FINDINGS: Feeding tube tip projects over the expected area of the stomach. Retained intraluminal contrast noted in the colon. No gas-filled dilated loops of bowel seen in the partially visualized abdomen and pelvis. Right femoral line. Left femoral vein approach trans venous pacing wire partially visualized. IMPRESSION: Feeding tube tip projects over the expected area of the stomach. Electronically Signed   By: Yetta Glassman M.D.   On: 09/24/2021 12:10   DG Swallowing Func-Speech Pathology  Result Date: 09/22/2021 Table formatting from the original result was not included. Objective Swallowing Evaluation: Type of Study: MBS-Modified Barium Swallow Study  Patient Details Name: Aaron Carlson MRN: [id number] Date of Birth: [birth year] Today's Date: 09/22/2021 Time: SLP Start Time (ACUTE ONLY): 1458 -SLP Stop Time (ACUTE ONLY): 1516 SLP Time Calculation (min) (ACUTE ONLY): 18 min Past Medical History: Past Medical History: Diagnosis Date  CAD in native artery 08/28/2013  Heaviness in chest with exertion. Coronary angiography 2005 demonstrated severe distal LAD and diagonal disease, severe distal  RCA and PL OM disease, and severe first obtuse marginal disease. No high-grade proximal coronary disease was present at the time.  The most recent myocardial perfusion study in 2014 was nonischemic/low risk    Chronic renal insufficiency   Colon polyps   adenomatous  Diabetes mellitus   Hyperlipemia   Hypertension   Laryngeal papillomatosis   Prostate cancer ([place])  w/ mets to bladder Past Surgical History: Past Surgical History: Procedure Laterality Date  BLADDER SURGERY    CATARACT EXTRACTION Left 02/2020  CIRCUMCISION  2011  POLYPECTOMY    vocal cords  PROSTATE SURGERY   HPI: Pt is an 88 yo male presenting with syncope and found unresponsive. Pt went into PEA arrest requiring CPR x3 minutes with ROSC. ETT 12/24-12/25. Pt initially passed a yale swallow screen post-extubation but then a code stroke was called in the early morning on 12/26 due to R sided weakness and slurred speech. CTH on admission negative for acute changes but repeat on 12/26 revealed acute L sided ICH. MRI with considerable motion degradation but redmonstrates intraparenchymal hematoma at the L frontoparietal junction with surrounding edema. PMH significant for recurrent laryngeal papillomatosis s/p laser excision, CAD, DM2, HTN, HLD, Prostrate Cancer  Subjective: lethargic  Recommendations for follow up therapy are one component of a multi-disciplinary discharge planning process, led by the attending physician.  Recommendations may be updated based on patient status, additional functional criteria and insurance authorization. Assessment / Plan / Recommendation Clinical Impressions 09/22/2021 Clinical Impression Pt has a moderate oropharyngeal dysphagia due to sensory and motor deficits, perhaps further exacerbated during this study given pt lethargy. He has weak labial seal on the R that allows for anterior loss, as well as reduced lingual propulsion and reduced coordination for posterior transit. Oral residue remains, especially on his R side  and in his anterior sulcus. His bolus cohesion is reduced and his timing is inconsistent for swallow trigger. Most consistencies initiate a swallow at the pyriform sinuses at least intermittently, although the thinner the consistency, the more consistently it reaches the pyriform sinuses before the swallow. Thin and nectar thick liquids more consistently enter the airway before the swallow in small quantities, but aspiration is silent and his cued cough is too weak to clear his airway. Pt can contain honey thick liquids and purees in his valleculae at times, but deep penetration to the vocal folds occurs when his timing is off. There is also more residue in the valleculae and posterior pharyngeal wall due to reduced base of tongue retraction and pharyngeal squeeze. A chin tuck helps to improve airway protection and slightly reduce residue with purees. It helps with honey thick liquids as well, but he can't get boluses out of a straw and he can't coordinate a chin tuck with cup sips. A spoon had to be used. Recommend starting with Dys 1 (puree) diet and honey thick liquids by spoon with full supervision for use of a chin tuck. Anticipate that it would be hard for him to meet his nutritional needs with such restrictions and with current lethargy, so may want to consider temporary alternative means of nutrition to supplement PO diet. Will f/u for ongoing dysphagia management. SLP Visit Diagnosis Dysphagia, oropharyngeal phase (R13.12) Attention and concentration deficit following -- Frontal lobe and executive function deficit following -- Impact on safety and function Moderate aspiration risk;Risk for inadequate nutrition/hydration   Treatment Recommendations 09/22/2021 Treatment Recommendations Therapy as outlined in treatment plan below   Prognosis 09/22/2021 Prognosis for Safe Diet Advancement Good Barriers to Reach Goals -- Barriers/Prognosis Comment -- Diet Recommendations 09/22/2021 SLP Diet Recommendations  Dysphagia 1 (Puree) solids;Honey thick liquids Liquid Administration via Spoon Medication Administration Crushed with puree Compensations Minimize environmental distractions;Slow rate;Small sips/bites;Lingual sweep for clearance of pocketing;Monitor for anterior loss;Chin tuck Postural Changes Seated upright at 90 degrees;Remain semi-upright after after feeds/meals (Comment)   Other Recommendations 09/22/2021 Recommended Consults --  Oral Care Recommendations Oral care BID Other Recommendations Order thickener from pharmacy;Prohibited food (jello, ice cream, thin soups);Remove water pitcher;Have oral suction available Follow Up Recommendations Acute inpatient rehab (3hours/day) Assistance recommended at discharge Intermittent Supervision/Assistance Functional Status Assessment Patient has had a recent decline in their functional status and demonstrates the ability to make significant improvements in function in a reasonable and predictable amount of time. Frequency and Duration  09/22/2021 Speech Therapy Frequency (ACUTE ONLY) min 2x/week Treatment Duration 2 weeks   Oral Phase 09/22/2021 Oral Phase Impaired Oral - Pudding Teaspoon -- Oral - Pudding Cup -- Oral - Honey Teaspoon Decreased bolus cohesion;Lingual/palatal residue;Reduced posterior propulsion;Right anterior bolus loss Oral - Honey Cup Decreased bolus cohesion;Lingual/palatal residue;Reduced posterior propulsion;Right anterior bolus loss Oral - Nectar Teaspoon -- Oral - Nectar Cup Decreased bolus cohesion;Lingual/palatal residue;Reduced posterior propulsion;Right anterior bolus loss Oral - Nectar Straw Decreased bolus cohesion;Lingual/palatal residue;Reduced posterior propulsion Oral - Thin Teaspoon -- Oral - Thin Cup Decreased bolus cohesion;Lingual/palatal residue;Reduced posterior propulsion;Right anterior bolus loss Oral - Thin Straw -- Oral - Puree Decreased bolus cohesion;Lingual/palatal residue;Reduced posterior propulsion;Right anterior bolus loss  Oral - Mech Soft -- Oral - Regular -- Oral - Multi-Consistency -- Oral - Pill -- Oral Phase - Comment --  Pharyngeal Phase 09/22/2021 Pharyngeal Phase Impaired Pharyngeal- Pudding Teaspoon -- Pharyngeal -- Pharyngeal- Pudding Cup -- Pharyngeal -- Pharyngeal- Honey Teaspoon Reduced pharyngeal peristalsis;Reduced tongue base retraction;Pharyngeal residue - valleculae;Delayed swallow initiation-pyriform sinuses;Compensatory strategies attempted (with notebox) Pharyngeal -- Pharyngeal- Honey Cup Reduced pharyngeal peristalsis;Reduced tongue base retraction;Penetration/Apiration after swallow;Pharyngeal residue - valleculae;Delayed swallow initiation-pyriform sinuses Pharyngeal Material enters airway, CONTACTS cords and not ejected out Pharyngeal- Nectar Teaspoon -- Pharyngeal -- Pharyngeal- Nectar Cup Delayed swallow initiation-pyriform sinuses;Penetration/Aspiration before swallow;Reduced tongue base retraction;Pharyngeal residue - valleculae Pharyngeal Material enters airway, CONTACTS cords and not ejected out Pharyngeal- Nectar Straw Delayed swallow initiation-pyriform sinuses;Penetration/Aspiration before swallow;Reduced tongue base retraction;Pharyngeal residue - valleculae Pharyngeal Material enters airway, passes BELOW cords without attempt by patient to eject out (silent aspiration) Pharyngeal- Thin Teaspoon -- Pharyngeal -- Pharyngeal- Thin Cup Delayed swallow initiation-pyriform sinuses;Penetration/Aspiration before swallow;Reduced tongue base retraction Pharyngeal Material enters airway, passes BELOW cords without attempt by patient to eject out (silent aspiration) Pharyngeal- Thin Straw -- Pharyngeal -- Pharyngeal- Puree Reduced pharyngeal peristalsis;Reduced tongue base retraction;Penetration/Apiration after swallow;Pharyngeal residue - valleculae;Delayed swallow initiation-pyriform sinuses;Penetration/Aspiration during swallow;Reduced airway/laryngeal closure Pharyngeal Material enters airway, CONTACTS  cords and not ejected out Pharyngeal- Mechanical Soft -- Pharyngeal -- Pharyngeal- Regular -- Pharyngeal -- Pharyngeal- Multi-consistency -- Pharyngeal -- Pharyngeal- Pill -- Pharyngeal -- Pharyngeal Comment --  Cervical Esophageal Phase  09/22/2021 Cervical Esophageal Phase WFL Pudding Teaspoon -- Pudding Cup -- Honey Teaspoon -- Honey Cup -- Nectar Teaspoon -- Nectar Cup -- Nectar Straw -- Thin Teaspoon -- Thin Cup -- Thin Straw -- Puree -- Mechanical Soft -- Regular -- Multi-consistency -- Pill -- Cervical Esophageal Comment -- Osie Bond., M.A. [facility] Pager [phone] Office [phone] 09/22/2021, 4:29 PM                     ECHOCARDIOGRAM COMPLETE  Result Date: 09/21/2021    ECHOCARDIOGRAM REPORT   Patient Name:   Aaron Carlson Date of Exam: 09/21/2021 Medical Rec #:  [id number]       Height:       68.0 in Accession #:    [id number]      Weight:       202.6 lb Date of Birth:  25-Apr-1933  BSA:          2.055 m Patient Age:    43 years        BP:           105/53 mmHg Patient Gender: M               HR:           73 bpm. Exam Location:  Inpatient Procedure: 2D Echo Indications:     cardiac arrest  History:         Patient has prior history of Echocardiogram examinations, most                  recent 12/06/2020. CAD; Risk Factors:Diabetes and Hypertension.  Sonographer:     Johny Chess RDCS Referring Phys:  [id number] Estill Cotta Diagnosing Phys: Glori Bickers MD IMPRESSIONS  1. Left ventricular ejection fraction, by estimation, is 50 to 55%. The left ventricle has low normal function. The left ventricle has no regional wall motion abnormalities. Left ventricular diastolic parameters are consistent with Grade I diastolic dysfunction (impaired relaxation). There is hypokinesis of the left ventricular, basal-mid inferior wall and inferolateral wall.  2. Right ventricular systolic function is normal. The right ventricular size is normal. There is normal pulmonary  artery systolic pressure.  3. Left atrial size was mildly dilated.  4. The mitral valve is normal in structure. No evidence of mitral valve regurgitation. No evidence of mitral stenosis.  5. The aortic valve has an indeterminant number of cusps. There is moderate calcification of the aortic valve. Aortic valve regurgitation is not visualized. Aortic valve sclerosis/calcification is present, without any evidence of aortic stenosis.  6. The inferior vena cava is normal in size with greater than 50% respiratory variability, suggesting right atrial pressure of 3 mmHg. FINDINGS  Left Ventricle: Left ventricular ejection fraction, by estimation, is 50 to 55%. The left ventricle has low normal function. The left ventricle has no regional wall motion abnormalities. The left ventricular internal cavity size was normal in size. There is no left ventricular hypertrophy. Left ventricular diastolic parameters are consistent with Grade I diastolic dysfunction (impaired relaxation). Right Ventricle: The right ventricular size is normal. No increase in right ventricular wall thickness. Right ventricular systolic function is normal. There is normal pulmonary artery systolic pressure. The tricuspid regurgitant velocity is 2.08 m/s, and  with an assumed right atrial pressure of 3 mmHg, the estimated right ventricular systolic pressure is 62.8 mmHg. Left Atrium: Left atrial size was mildly dilated. Right Atrium: Right atrial size was normal in size. Pericardium: There is no evidence of pericardial effusion. Mitral Valve: The mitral valve is normal in structure. No evidence of mitral valve regurgitation. No evidence of mitral valve stenosis. Tricuspid Valve: The tricuspid valve is normal in structure. Tricuspid valve regurgitation is not demonstrated. No evidence of tricuspid stenosis. Aortic Valve: The aortic valve has an indeterminant number of cusps. There is moderate calcification of the aortic valve. Aortic valve regurgitation is  not visualized. Aortic valve sclerosis/calcification is present, without any evidence of aortic stenosis. Pulmonic Valve: The pulmonic valve was normal in structure. Pulmonic valve regurgitation is not visualized. No evidence of pulmonic stenosis. Aorta: The aortic root is normal in size and structure. Venous: The inferior vena cava is normal in size with greater than 50% respiratory variability, suggesting right atrial pressure of 3 mmHg. IAS/Shunts: No atrial level shunt detected by color flow Doppler.  LEFT VENTRICLE PLAX 2D LVIDd:  3.80 cm   Diastology LVIDs:         2.30 cm   LV e' medial:    4.68 cm/s LV PW:         1.00 cm   LV E/e' medial:  12.0 LV IVS:        1.10 cm   LV e' lateral:   5.66 cm/s LVOT diam:     1.90 cm   LV E/e' lateral: 9.9 LV SV:         43 LV SV Index:   21 LVOT Area:     2.84 cm  RIGHT VENTRICLE            IVC RV S prime:     6.20 cm/s  IVC diam: 1.50 cm TAPSE (M-mode): 1.5 cm LEFT ATRIUM             Index        RIGHT ATRIUM          Index LA diam:        2.50 cm 1.22 cm/m   RA Area:     9.71 cm LA Vol (A2C):   44.2 ml 21.51 ml/m  RA Volume:   21.20 ml 10.32 ml/m LA Vol (A4C):   19.7 ml 9.59 ml/m LA Biplane Vol: 32.0 ml 15.57 ml/m  AORTIC VALVE LVOT Vmax:   85.90 cm/s LVOT Vmean:  53.400 cm/s LVOT VTI:    0.152 m  AORTA Ao Root diam: 2.90 cm Ao Asc diam:  3.10 cm MITRAL VALVE               TRICUSPID VALVE MV Area (PHT): 3.12 cm    TR Peak grad:   17.3 mmHg MV Decel Time: 243 msec    TR Vmax:        208.00 cm/s MV E velocity: 56.20 cm/s MV A velocity: 66.30 cm/s  SHUNTS MV E/A ratio:  0.85        Systemic VTI:  0.15 m                            Systemic Diam: 1.90 cm Glori Bickers MD Electronically signed by Glori Bickers MD Signature Date/Time: 09/21/2021/3:01:56 PM    Final (Updated)    OCT, Retina - OU - Both Eyes  Result Date: 09/03/2021 Right Eye Quality was borderline. Scan locations included subfoveal. Central Foveal Thickness: 240. Progression has been  stable. Findings include normal foveal contour. Left Eye Quality was good. Scan locations included subfoveal. Central Foveal Thickness: 258. Progression has improved. Findings include abnormal foveal contour, cystoid macular edema. Notes OS much improved now only on topical NSAIDs.  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  88 year old male. EXAM: CT HEAD WITHOUT CONTRAST TECHNIQUE: Contiguous axial images were obtained from the base of the skull through the vertex without intravenous contrast. COMPARISON:  Head CT 09/01/2021. FINDINGS: Brain: Rounded intra-axial mixed density hemorrhage encompasses 42 by 30 by 36 mm (AP by transverse by CC) and there is a small volume of extra-axial extension of blood into the regional subarachnoid space. This is centered in the left perirolandic region, posterior left frontal and anterior parietal lobes just above the posterior operculum. There is  surrounding edema. Mild regional mass effect. But no midline shift. No intraventricular extension of blood identified. No ventriculomegaly. Basilar cisterns remain patent and normal. No superimposed evidence of cortically based acute infarction. Gray-white matter differentiation outside of the area of acute hemorrhage is stable from 2 days ago. Vascular: Calcified atherosclerosis at the skull base. No suspicious intracranial vascular hyperdensity. Skull: No skull fracture identified. Sinuses/Orbits: Visualized paranasal sinuses and mastoids are stable and well aerated. Other: Broad-based left posterior convexity scalp hematoma. No scalp soft tissue gas. Underlying calvarium appears stable and intact. Orbits appear stable, negative. ASPECTS [facility] Stroke Program Early CT Score)  - Ganglionic level infarction (caudate, lentiform nuclei, internal capsule, insula, M1-M3 cortex): - Supraganglionic infarction (M4-M6 cortex): Total score (0-10 with 10 being normal): IMPRESSION: 1. Acute left side 23 mL intra-axial and perirolandic hemorrhage with small volume extension of subarachnoid blood. Regional edema, but no midline shift. No IVH or ventriculomegaly. 2. Left posterior convexity scalp hematoma without underlying skull fracture. 3. No other acute intracranial abnormality. 4. These results were communicated to Dr. Lorrin Goodell at 4:36 am on 09/22/2021 by text page via the [facility] messaging system. Electronically Signed   By: Genevie Ann M.D.   On: 09/22/2021 04:37   VAS US CAROTID  Result Date: 09/02/2021 Carotid Arterial Duplex Study Patient Name:  Aaron Carlson  Date of Exam:   09/06/2021 Medical Rec #: [id number]        Accession #:    [id number] Date of Birth: 04/12/1933        Patient Gender: M Patient Age:   89 years Exam Location:  [facility] Procedure:      VAS US CAROTID Referring Phys: Rosalin Hawking --------------------------------------------------------------------------------  Indications:      CVA. Risk Factors:     Hypertension, hyperlipidemia, Diabetes, coronary artery                   disease. Comparison Study: No prior study Performing Technologist: Maudry Mayhew MHA, RDMS, RVT, RDCS  Examination Guidelines: A complete evaluation includes B-mode imaging, spectral Doppler, color Doppler, and power Doppler as needed of all accessible portions of each vessel. Bilateral testing is considered an integral part of a complete examination. Limited examinations for reoccurring indications may be performed as noted.  Right Carotid Findings: +----------+--------+--------+--------+-----------------------+--------+             PSV cm/s EDV cm/s Stenosis Plaque Description      Comments  +----------+--------+--------+--------+-----------------------+--------+  CCA Prox   60        14                                                  +----------+--------+--------+--------+-----------------------+--------+  CCA Distal 47       13                                                  +----------+--------+--------+--------+-----------------------+--------+  ICA Prox   39       14                smooth and heterogenous           +----------+--------+--------+--------+-----------------------+--------+  ICA Distal 60       21                                                  +----------+--------+--------+--------+-----------------------+--------+  ECA        62       6                 smooth and heterogenous           +----------+--------+--------+--------+-----------------------+--------+ +----------+--------+-------+----------------+-------------------+             PSV cm/s EDV cms Describe         Arm Pressure (mmHG)  +----------+--------+-------+----------------+-------------------+  Subclavian 57               Multiphasic, WNL                      +----------+--------+-------+----------------+-------------------+ +---------+--------+--+--------+--+---------+  Vertebral PSV cm/s 57 EDV cm/s 12 Antegrade  +---------+--------+--+--------+--+---------+  Left Carotid Findings: +----------+-------+--------+--------+-----------------------+-----------------+             PSV     EDV cm/s Stenosis Plaque Description      Comments                       cm/s                                                                 +----------+-------+--------+--------+-----------------------+-----------------+  CCA Prox   69      18                                                           +----------+-------+--------+--------+-----------------------+-----------------+  CCA Distal 69      21                                        intimal                                                                          thickening         +----------+-------+--------+--------+-----------------------+-----------------+  ICA Prox    47      13                heterogenous and                                                                 calcific                                   +----------+-------+--------+--------+-----------------------+-----------------+  ICA Distal 91      40                                                           +----------+-------+--------+--------+-----------------------+-----------------+  ECA        41      5                 heterogenous and                                                                 calcific                                   +----------+-------+--------+--------+-----------------------+-----------------+ +----------+--------+--------+----------------+-------------------+             PSV cm/s EDV cm/s Describe         Arm Pressure (mmHG)  +----------+--------+--------+----------------+-------------------+  Subclavian 60                Multiphasic, WNL                      +----------+--------+--------+----------------+-------------------+ +---------+--------+--+--------+--+---------+  Vertebral PSV cm/s 69 EDV cm/s 21 Antegrade  +---------+--------+--+--------+--+---------+   Summary: Right Carotid: Velocities in the right ICA are consistent with a 1-39% stenosis. Left Carotid: Velocities in the left ICA are consistent with a 1-39% stenosis. Vertebrals:  Bilateral vertebral arteries demonstrate antegrade flow. Subclavians: Normal flow hemodynamics were seen in bilateral subclavian              arteries. *See table(s) above for measurements and observations.     Preliminary     Microbiology Recent Results (from the past 240 hour(s))  Resp Panel by RT-PCR (Flu A&B, Covid) Nasopharyngeal Swab     Status: None   Collection Time: 09/16/2021  6:41 PM   Specimen: Nasopharyngeal Swab; Nasopharyngeal(NP) swabs in vial transport medium  Result Value Ref Range Status   SARS Coronavirus 2 by RT PCR NEGATIVE NEGATIVE Final    Comment: (NOTE) SARS-CoV-2 target nucleic acids are NOT DETECTED.  The  SARS-CoV-2 RNA is generally detectable in upper respiratory specimens during the acute phase of infection. The lowest concentration of SARS-CoV-2 viral copies this assay can detect is 138 copies/mL. A negative result does not preclude SARS-Cov-2 infection and should not be used as the sole basis for treatment or other patient management decisions. A negative result may occur with  improper specimen collection/handling, submission of specimen other than nasopharyngeal swab, presence of viral mutation(s) within the areas targeted by this assay, and inadequate number of viral copies(<138 copies/mL). A negative result must be combined with clinical observations, patient history, and epidemiological information. The expected result is Negative.  Fact Sheet for Patients:  EntrepreneurPulse.com.au  Fact Sheet for Healthcare Providers:  IncredibleEmployment.be  This test is no t yet approved or cleared by the [place] FDA and  has been authorized for detection and/or diagnosis of SARS-CoV-2 by FDA under an Emergency Use Authorization (EUA). This EUA will remain  in effect (meaning this test can be used) for the duration of the COVID-19 declaration under Section 564(b)(1) of the Act, 21 U.S.C.section 360bbb-3(b)(1), unless the authorization is terminated  or revoked sooner.       Influenza A by PCR NEGATIVE NEGATIVE Final   Influenza B by PCR NEGATIVE NEGATIVE Final    Comment: (NOTE) The Xpert Xpress SARS-CoV-2/FLU/RSV plus assay is intended as an  aid in the diagnosis of influenza from Nasopharyngeal swab specimens and should not be used as a sole basis for treatment. Nasal washings and aspirates are unacceptable for Xpert Xpress SARS-CoV-2/FLU/RSV testing.  Fact Sheet for Patients: EntrepreneurPulse.com.au  Fact Sheet for Healthcare Providers: IncredibleEmployment.be  This test is not yet approved or  cleared by the [place] FDA and has been authorized for detection and/or diagnosis of SARS-CoV-2 by FDA under an Emergency Use Authorization (EUA). This EUA will remain in effect (meaning this test can be used) for the duration of the COVID-19 declaration under Section 564(b)(1) of the Act, 21 U.S.C. section 360bbb-3(b)(1), unless the authorization is terminated or revoked.  Performed at [facility], [place] [address], [place], [place] [address]   MRSA Next Gen by PCR, Nasal     Status: None   Collection Time: 08/31/2021  9:33 PM   Specimen: Nasal Mucosa; Nasal Swab  Result Value Ref Range Status   MRSA by PCR Next Gen NOT DETECTED NOT DETECTED Final    Comment: (NOTE) The GeneXpert MRSA Assay (FDA approved for NASAL specimens only), is one component of a comprehensive MRSA colonization surveillance program. It is not intended to diagnose MRSA infection nor to guide or monitor treatment for MRSA infections. Test performance is not FDA approved in patients less than 38 years old. Performed at [facility], [place] [address], [place], [place] [address]   Urine Culture     Status: Abnormal   Collection Time: 09/24/21  3:34 PM   Specimen: Urine, Suprapubic  Result Value Ref Range Status   Specimen Description URINE, SUPRAPUBIC  Final   Special Requests   Final    NONE Performed at [facility], [place] [address], [place], [place] [address]    Culture >=100,000 COLONIES/mL ENTEROCOCCUS FAECALIS (A)  Final   Report Status 10/26/2021 FINAL  Final   Organism ID, Bacteria ENTEROCOCCUS FAECALIS (A)  Final      Susceptibility   Enterococcus faecalis - MIC*    AMPICILLIN <=2 SENSITIVE Sensitive     NITROFURANTOIN <=16 SENSITIVE Sensitive     VANCOMYCIN 1 SENSITIVE Sensitive     * >=100,000 COLONIES/mL ENTEROCOCCUS FAECALIS  Expectorated Sputum Assessment w Gram Stain, Rflx to Resp Cult     Status: None   Collection Time: 09/25/21  9:04 AM   Specimen: Expectorated Sputum   Result Value Ref Range Status   Specimen Description EXPECTORATED SPUTUM  Final   Special Requests NONE  Final   Sputum evaluation   Final    THIS SPECIMEN IS ACCEPTABLE FOR SPUTUM CULTURE Performed at [facility], [place] [address], [place], [place] [address]    Report Status 09/25/2021 FINAL  Final  Culture, Respiratory w Gram Stain     Status: None (Preliminary result)   Collection Time: 09/25/21  9:04 AM  Result Value Ref Range Status   Specimen Description EXPECTORATED SPUTUM  Final   Special Requests NONE Reflexed from [id number]  Final   Gram Stain   Final    ABUNDANT WBC PRESENT,BOTH PMN AND MONONUCLEAR FEW GRAM NEGATIVE RODS MODERATE GRAM POSITIVE COCCI RARE GRAM VARIABLE ROD FEW SQUAMOUS EPITHELIAL CELLS PRESENT    Culture   Final    TOO YOUNG TO READ Performed at [facility], [place] [address] [place],  [address]    Report Status PENDING  Incomplete    Lab Basic Metabolic Panel: Recent Labs  Lab 09/22/21 0133 09/11/2021 0036 09/14/2021 1039 09/24/21  9147 09/24/21 1515 09/25/21 0423 09/25/21 1452 09/25/21 2149 October 10, 2021 0525  NA 140 142   < > 145  --  144 150* 146* 141  K 3.5 3.1*   < > 3.5  --  3.5 3.9 3.9 5.0  CL 117* 121*  --  118*  --  120*  --   --  116*  CO2 15* 14*  --  17*  --  15*  --   --  12*  GLUCOSE 198* 164*  --  152*  --  255*  --   --  356*  BUN 40* 42*  --  43*  --  46*  --   --  61*  CREATININE 1.97* 1.74*  --  2.05*  --  2.21*  --   --  3.12*  CALCIUM 8.3* 8.6*  --  8.2*  --  8.3*  --   --  7.9*  MG 1.9 2.4  --  2.1 2.2 2.3  --   --  2.4  PHOS 5.0* 3.2  --  1.4* 4.2 3.2  --   --  4.9*   < > = values in this interval not displayed.   Liver Function Tests: Recent Labs  Lab 09/27/2021 1806  AST 35  ALT 36  ALKPHOS 90  BILITOT 0.7  PROT 6.2*  ALBUMIN 3.4*   No results for input(s): LIPASE, AMYLASE in the last 168 hours. No results for input(s): AMMONIA in the last 168 hours. CBC: Recent Labs  Lab 09/02/2021 1806  09/22/2021 1823 09/22/21 0133 09/04/2021 0036 08/30/2021 1039 09/24/21 0428 09/25/21 0423 09/25/21 1452 09/25/21 2149 10/10/2021 0525  WBC 14.6*   < > 13.2* 13.6*  --  12.3* 11.5*  --   --  17.4*  NEUTROABS 8.2*  --   --   --   --   --   --   --   --   --   HGB 10.6*   < > 9.9* 9.6*   < > 9.3* 8.6* 9.9* 8.5* 8.8*  HCT 32.4*   < > 29.6* 28.7*   < > 27.4* 27.7* 29.0* 25.0* 29.2*  MCV 92.6   < > 91.1 91.4  --  90.7 95.2  --   --  98.3  PLT 198   < > 155 167  --  157 140*  --   --  163   < > = values in this interval not displayed.   Cardiac Enzymes: No results for input(s): CKTOTAL, CKMB, CKMBINDEX, TROPONINI in the last 168 hours. Sepsis Labs: Recent Labs  Lab 09/03/2021 2244 09/21/21 0807 09/22/21 0133 08/28/2021 0036 09/24/21 0428 09/25/21 0423 2021/10/10 0525  WBC  --  12.7*   < > 13.6* 12.3* 11.5* 17.4*  LATICACIDVEN 1.6 1.7  --   --   --   --   --    < > = values in this interval not displayed.   Candee Furbish 10/10/2021, 3:01 PM

## 2021-09-28 NOTE — Progress Notes (Addendum)
NAME:  Aaron Carlson, MRN:  [id number], DOB:  [birth year], LOS: 6 ADMISSION DATE:  09/18/2021, CONSULTATION DATE:  12/24 REFERRING MD:  Darl Householder, CHIEF COMPLAINT:  syncope   History of Present Illness:  Patient is encephalopathic and/or intubated. Therefore history has been obtained from chart review.   Aaron Carlson, is a 88 y.o. male, who presented to the [facility] ED with a chief complaint of syncope  They have a pertinent past medical history of CAD, combined systolic and diastolic HF, HTN, HLD, DM, CKD 3/4, GERD  Per documentation, patient had witnessed syncopal episode while at home, hit head on ground, reported unresponsive for 1 min. On arrival EMS found patient in complete heart block with rate of 30. Patient reported oriented x4. Atropine given, pacing initiated, 5mg  versed given. Per EDP patient removed pacing pads, HR decreased, and then became pulseless. CPR initiated with 3 rounds of CPR and 1 epi  He was intubated in the ED. Cardiology was consulted. CT head is pending. Trop 25. ECG: ST, RBBB, IVCD ,7.29/36/352.17.2. CXR with no infiltrate, pneumo, or effusion.   PCCM was consulted for admission.   Pertinent  Medical History  CAD, combined systolic and diastolic HF, HTN, HLD, DM, CKD 3/4, GERD  Significant Hospital Events: Including procedures, antibiotic start and stop dates in addition to other pertinent events   12/24 Syncope, EMS called, 3rd degree HB, paced, removed pads, asystole, CPR, intubated in ED. CT head> negative. 12/25 successfully extubated 12/26 developed acute right-sided weakness facial droop.  Seen by neurology.  Has 23 mm ICH in left MCA territory, SJ H and left scalp hematoma.  Heparin given for possible ACS stopped and reversed with protamine. 12/27 recurrent arrest, TVP placement 12/28 extubation 12/29 worsening resp status, reintubated  Interim History / Subjective:  Febrile, on pressors, temp wire dependent  Objective   Blood pressure (!) 71/60,  pulse 93, temperature (!) 100.4 F (38 C), temperature source Axillary, resp. rate (!) 28, height 5\' 8"  (1.727 m), weight 91.7 kg, SpO2 94 %. CVP:  [11 mmHg] 11 mmHg  Vent Mode: PRVC FiO2 (%):  [50 %-100 %] 50 % Set Rate:  [18 bmp] 18 bmp Vt Set:  [540 mL] 540 mL PEEP:  [5 cmH20] [place] Pressure:  [21 cmH20-25 cmH20] 23 cmH20   Intake/Output Summary (Last 24 hours) at Oct 21, 2021 0726 Last data filed at 21-Oct-2021 1937 Gross per 24 hour  Intake 4344.14 ml  Output 1850 ml  Net 2494.14 ml    Filed Weights   09/18/2021 0600 09/24/21 0500 09/25/21 0600  Weight: 90.6 kg 90.6 kg 91.7 kg    Examination: Awakens to voice and moves ext x 4 Tachypneic, uncomfortable appearing Rhonci bilaterally, thick secretions Ext warm  BUN/Cr slightly up again Febrile Enterococcus in ileal conduit Sugars are up Bicarb down  Assessment & Plan:   Acute respiratory failure with hypoxia- he either had recurrent aspiration or HCAP leading to recurrent decompensation 12/29 New 12/29 septic shock- related to pneumonia vs. Less likely enterococcal UTI Acute intracranial hemorrhage likely secondary to subclinical cerebral contusion at time of initial syncope.  Hemorrhaged when started on anticoagulation.  No clear evidence of underlying mass on MRI Cardiac arrest- asystole, recurrent- TVP now in place Third degree heart block- presented on admission  Syncope- suspect secondary to arrhythmia vs hypoperfusion NAGMA, AKI on CKD3-4, ileal conduit in place- complicated by septic shock with ATN, hopefully does not progress HX CAD Hx systolic and diastolic CHF- looks volume down if  anything Probable ACS with intervention contraindicated due to intracranial bleed HX HLD  HX HTN Syncope/Fall DM2 w/ hyperglycemia  stable GERD Dysphagia- will do strict NPO when extubated again  - Vent support today, add fentanyl gtt to ease WOB - Add vasopressin to levophed titrate to MAP 65 - Start levemir with  SSI - Bicarb gtt x 1 L - Broad spectrum abx, f/u culture data - If recovers will probably give one more shot off vent with strict NPO; if fails this family would want a trach - Will get PPM if we can get respiratory status and secretion burden improved  Best Practice (right click and "Reselect all SmartList Selections" daily)   Diet/type: TF DVT prophylaxis: SCD, need to rechallenge at some point GI prophylaxis: PPI Lines: femoral CVL/ A line Foley:  Yes, and it is still needed Code Status:  full code Last date of multidisciplinary goals of care discussion [discussed daily with family at bedside]   Patient critically ill due to resp failure Interventions to address this today HFNC titration, intubation watch Risk of deterioration without these interventions is high  I personally spent 33 minutes providing critical care not including any separately billable procedures  Erskine Emery MD [facility]  Prefer epic messenger for cross cover needs If after hours, please call E-link

## 2021-09-28 NOTE — Progress Notes (Signed)
Patient notably unstable upon start of shift. Dr Tamala Julian notified. Titration up on Levo. Vaso added.  Shortly after Vaso, Epi added. Pushed 2 amps of Epi with minimum response. Patient PEA arrested in the presence of bedside RN and Dr Erskine Emery. Code was called. Family was present at bedside when code was initiated. Eliseo Gum spoke with family outside the room.   8 min of ACLS performed, 3 Epi, 2 HCO3, 1 calcium given w/o ROSC.  Patient expired [phone] called by Dr. Erskine Emery.  Patient cleaned and family at bedside visiting.   Consuella Lose, RN

## 2021-09-28 NOTE — Progress Notes (Signed)
This chaplain responded to Code Blue with the medical team. The Pt. time of death is recorded at [phone].  The chaplain joined the Pt. wife-Everlena and grand-daughter-Tia in the consult room. The family accepted the chaplain's presence as sacred space for remembering the Pt. and honoring God's personal relationship with the Pt.  The chaplain moved with the family to the Pt. room for preparing hand prints for family. Other family members joined. The chaplain observed the gift of touch and story telling during the time together.    The chaplain updated the Pt. RN-Jessica and offered F/U spiritual care as needed.  Chaplain Sallyanne Kuster [phone]

## 2021-09-28 NOTE — Progress Notes (Signed)
I met with pt wife during cardiac arrest. She shares that he has just been through so much this week and he is ready to pass. We discussed code status and pt wife feels that DNR with cessation of current resuscitative efforts would be most appropriate at this time.   Notified team.   Notified pt wife regarding time of death after resuscitation ceased.  Emotional support provided   Eliseo Gum MSN, AGACNP-BC [place] for pager 10-04-2021, 8:43 AM

## 2021-09-28 NOTE — Progress Notes (Signed)
Electrophysiology Rounding Note  Patient Name: Aaron Carlson Date of Encounter: 10/04/2021  Primary Cardiologist: Sinclair Grooms, MD Electrophysiologist: New   Subjective   Intubated and sedated s/p resp arrest yesterday afternoon. Family present in room.  Inpatient Medications    Scheduled Meds:   stroke: mapping our early stages of recovery book   Does not apply Once   atorvastatin  40 mg Per Tube Daily   chlorhexidine gluconate (MEDLINE KIT)  15 mL Mouth Rinse BID   Chlorhexidine Gluconate Cloth  6 each Topical Daily   docusate  100 mg Per Tube BID   feeding supplement (PROSource TF)  45 mL Per Tube TID   fentaNYL (SUBLIMAZE) injection  25 mcg Intravenous Once   free water  200 mL Per Tube Q6H   glycopyrrolate  1 mg Oral TID   insulin aspart  0-15 Units Subcutaneous Q4H   insulin detemir  15 Units Subcutaneous BID   mouth rinse  15 mL Mouth Rinse 10 times per day   pantoprazole sodium  40 mg Per Tube Daily   polyethylene glycol  17 g Per Tube Daily   senna-docusate  1 tablet Per Tube BID   Continuous Infusions:  ceFEPime (MAXIPIME) IV Stopped (09/25/21 1900)   feeding supplement (VITAL 1.5 CAL) 50 mL/hr at 10/04/2021 0000   fentaNYL infusion INTRAVENOUS 25 mcg/hr (2021/10/04 0756)   norepinephrine (LEVOPHED) Adult infusion 32 mcg/min (10/04/21 0718)   propofol (DIPRIVAN) infusion 8 mcg/kg/min (2021-10-04 0718)    sodium bicarbonate (isotonic) infusion in sterile water     vancomycin Stopped (09/25/21 1758)   vasopressin 0.04 Units/min (October 04, 2021 0753)   PRN Meds: acetaminophen **OR** acetaminophen (TYLENOL) oral liquid 160 mg/5 mL **OR** acetaminophen, fentaNYL   Vital Signs    Vitals:   Oct 04, 2021 0630 October 04, 2021 0700 10-04-2021 0730 04-Oct-2021 0738  BP:  (!) 71/60    Pulse:    90  Resp: (!) 30 (!) 28 (!) 21   Temp:      TempSrc:      SpO2:    100%  Weight:      Height:        Intake/Output Summary (Last 24 hours) at 2021-10-04 0804 Last data filed at  2021-10-04 0718 Gross per 24 hour  Intake 4204.14 ml  Output 1200 ml  Net 3004.14 ml   Filed Weights   09/25/2021 0600 09/24/21 0500 09/25/21 0600  Weight: 90.6 kg 90.6 kg 91.7 kg    Physical Exam    GEN- The patient is intubated and sedated.  HEENT: +ETT in place.  Lungs- + mechanical breathing sounds.  Heart- Regular rate and rhythm,  GI- soft, non-tender, non-distended, bowel sounds present Extremities- no clubbing, cyanosis, or edema; DP/PT/radial pulses 2+ bilaterally MS- no significant deformity or atrophy Skin- warm and dry, no rash or lesion Neuro- Intubated and sedated.    Labs    CBC Recent Labs    09/25/21 0423 09/25/21 1452 09/25/21 2149 04-Oct-2021 0525  WBC 11.5*  --   --  17.4*  HGB 8.6*   < > 8.5* 8.8*  HCT 27.7*   < > 25.0* 29.2*  MCV 95.2  --   --  98.3  PLT 140*  --   --  163   < > = values in this interval not displayed.   Basic Metabolic Panel Recent Labs    09/25/21 0423 09/25/21 1452 09/25/21 2149 2021/10/04 0525  NA 144   < > 146* 141  K 3.5   < >  3.9 5.0  CL 120*  --   --  116*  CO2 15*  --   --  12*  GLUCOSE 255*  --   --  356*  BUN 46*  --   --  61*  CREATININE 2.21*  --   --  3.12*  CALCIUM 8.3*  --   --  7.9*  MG 2.3  --   --  2.4  PHOS 3.2  --   --  4.9*   < > = values in this interval not displayed.   Liver Function Tests No results for input(s): AST, ALT, ALKPHOS, BILITOT, PROT, ALBUMIN in the last 72 hours. No results for input(s): LIPASE, AMYLASE in the last 72 hours. Cardiac Enzymes No results for input(s): CKTOTAL, CKMB, CKMBINDEX, TROPONINI in the last 72 hours.   Telemetry    V pacing since resp arrest yesterday am. Brief episodes of sinus tach right after, but since then no rate variation. (personally reviewed)  Radiology    DG CHEST PORT 1 VIEW  Result Date: 09/25/2021 CLINICAL DATA:  Encounter for intubation. EXAM: PORTABLE CHEST 1 VIEW COMPARISON:  Chest radiograph 09/25/2021 at 7:07 a.m. FINDINGS: An  endotracheal tube has been placed and terminates 2 cm above the carina. A feeding tube courses into the abdomen with tip not imaged. A pacemaker lead remains in place, projecting over the right ventricle from an inferior approach. The cardiomediastinal silhouette is unchanged. Widespread airspace opacities in both lungs are unchanged. No large pleural effusion or pneumothorax is identified. IMPRESSION: Interval intubation. Unchanged extensive bilateral lung opacities compatible with pneumonia. Electronically Signed   By: Logan Bores M.D.   On: 09/25/2021 14:27   DG Chest Port 1 View  Result Date: 09/25/2021 CLINICAL DATA:  88 year old male with history of acute respiratory failure. EXAM: PORTABLE CHEST 1 VIEW COMPARISON:  Chest x-ray 09/24/2021. FINDINGS: A feeding tube is seen extending into the abdomen, however, the tip of the feeding tube extends below the lower margin of the image. Pacemaker lead projecting over the expected location of the right ventricular apex, presumably via transfemoral approach. Severe multifocal interstitial and airspace disease is now noted throughout the lungs bilaterally, most evident throughout the mid to lower lungs. Possible small left pleural effusion. No definite right pleural effusion. No pneumothorax. Pulmonary vasculature is obscured. Heart size is borderline enlarged. Upper mediastinal contours are within normal limits. Atherosclerotic calcifications are noted in the thoracic aorta. IMPRESSION: 1. Severe multilobar bilateral pneumonia with small left pleural effusion. 2. Aortic atherosclerosis. 3. Support apparatus, as above. Electronically Signed   By: Vinnie Langton M.D.   On: 09/25/2021 07:20   DG Abd Portable 1V  Result Date: 09/24/2021 CLINICAL DATA:  Feeding tube placement EXAM: PORTABLE ABDOMEN - 1 VIEW COMPARISON:  None. FINDINGS: Feeding tube tip projects over the expected area of the stomach. Retained intraluminal contrast noted in the colon. No  gas-filled dilated loops of bowel seen in the partially visualized abdomen and pelvis. Right femoral line. Left femoral vein approach trans venous pacing wire partially visualized. IMPRESSION: Feeding tube tip projects over the expected area of the stomach. Electronically Signed   By: Yetta Glassman M.D.   On: 09/24/2021 12:10    Patient Profile     Aaron Carlson is a 88 y.o. male with a history of CAD, combined systolic and diastolic HF, HTN, HLD, DM, CKD 3/4, and GERD who is being seen today for the evaluation of asystole and CHB at the request of Dr. Ellyn Hack.  Assessment & Plan    Transient complete heart block Suspect secondary to hypervagotonia in the setting of acute stroke S/p recurrent respiratory arrest 12/29, now in CHB without underlying down to 35 bpm.  Temp pacer with stable threshold this am.  He is not currently a PPM candidate without significant improvement.  Grim prognosis.   2. Acute hypoxic respiratory failure Extubated for second time 12/28.  Re intubated 12/29 with resp failure and inability to tolerate secretions To try wean again tomorrow per pulm, then consider trach.  Very poor candidate for EP procedures at this point.    3. ?CAD HS trop on admission elevated at 7052. Pt was not complaining of chest pain. ? If due to episode of HB with CPR LHC will likely be deferred this admission in setting of intracranial hemorrhage.  Complete Echo as below with normal EF and no WMA    4. Chronic diastolic heart failure: Echo 09/21/2021 LVEF 50-55% with no regional wall abnormalities.    5. Hypokalemia 5.0 this am in setting of AKI.   6. ARF  Cr 2.0 -> 2.2 -> 3.1  Pt is critically ill with increasingly poor prognosis.    For questions or updates, please contact [place] Please consult [email or web address] for contact info under Cardiology/STEMI.  Signed, Shirley Friar, PA-C  10/26/21, 8:04 AM

## 2021-09-28 DEATH — deceased

## 2021-09-29 LAB — CULTURE, RESPIRATORY W GRAM STAIN

## 2021-10-10 MED FILL — Medication: Qty: 1 | Status: AC

## 2021-11-24 ENCOUNTER — Ambulatory Visit: Payer: Medicare HMO | Admitting: Podiatry

## 2022-03-04 ENCOUNTER — Encounter (INDEPENDENT_AMBULATORY_CARE_PROVIDER_SITE_OTHER): Payer: Medicare HMO | Admitting: Ophthalmology

## 2022-06-16 IMAGING — CT CT HEAD W/O CM
4 series · 16 of 47 positions shown, 18 images · non-contrast
Comparison: Head CT from earlier same day at [DATE] a.m.

CLINICAL DATA: Stroke, follow-up.

EXAM:
CT HEAD WITHOUT CONTRAST
TECHNIQUE: Contiguous axial images were obtained from the base of the skull
through the vertex without intravenous contrast.

[Series 3: head without (person_name) · axial · non-contrast · 0.45mm/px · z∈[-108,+22]mm · 7 of 36 slices shown, 9 images]
[im 5/36  brain]
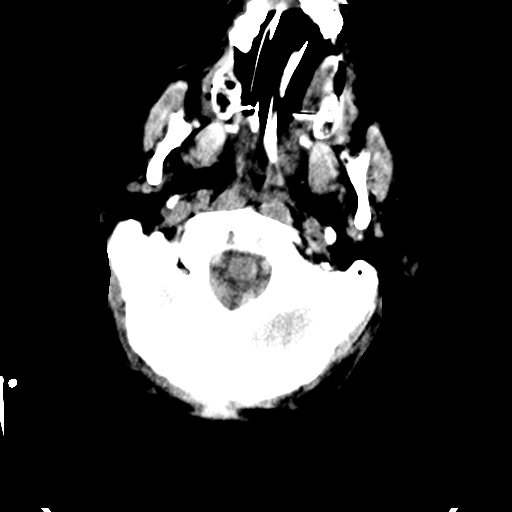
[im 5/36  bone]
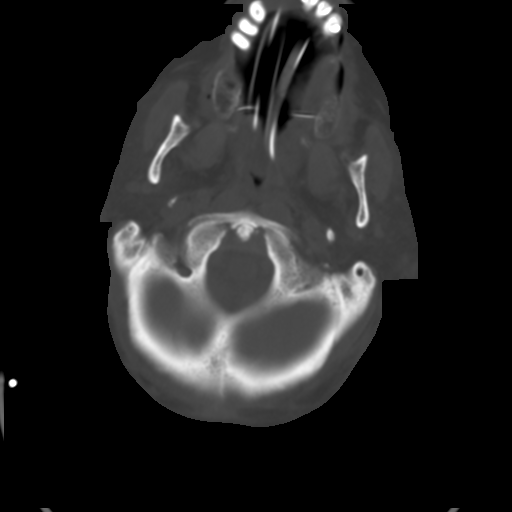
[im 9/36  brain]
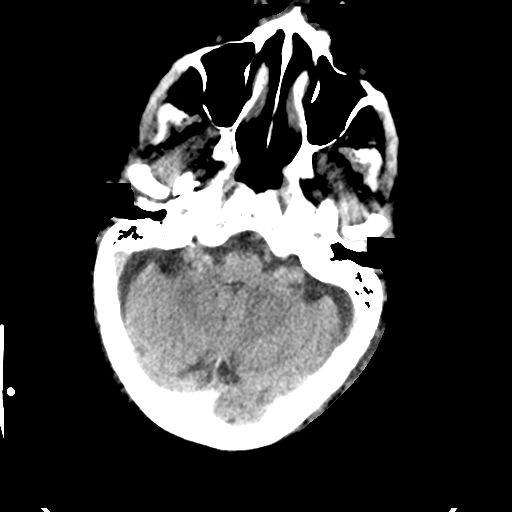
[im 14/36  brain]
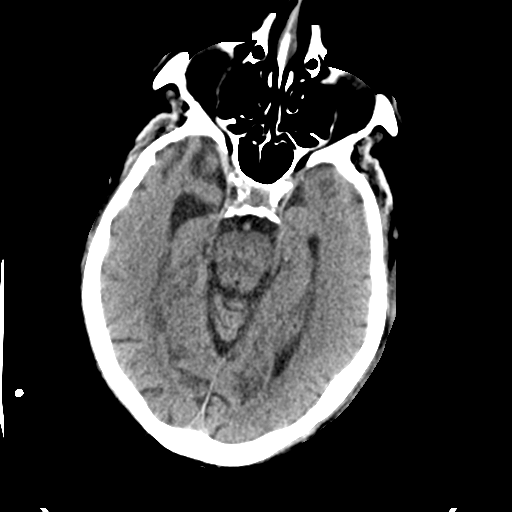
[im 18/36  brain]
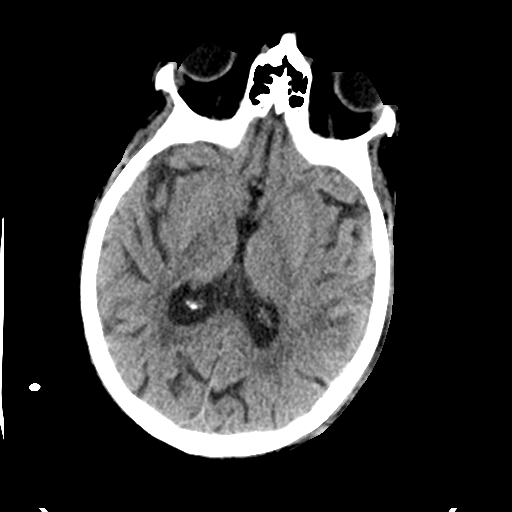
[im 22/36  brain]
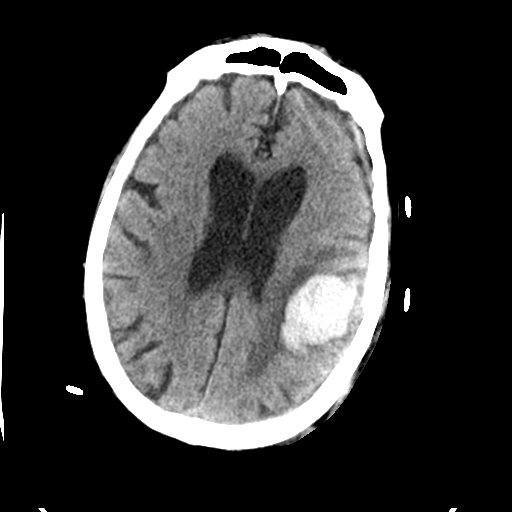
[im 22/36  bone]
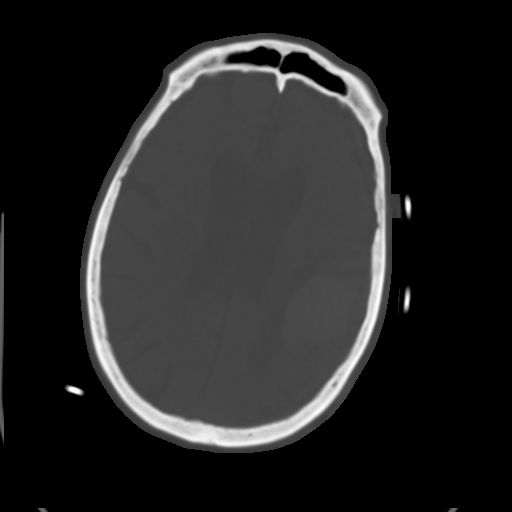
[im 27/36  brain]
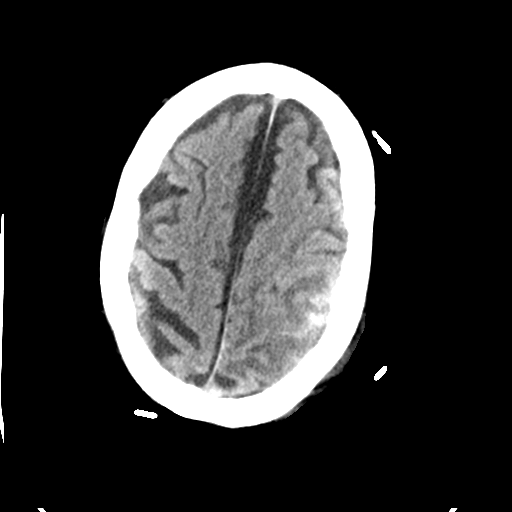
[im 31/36  brain]
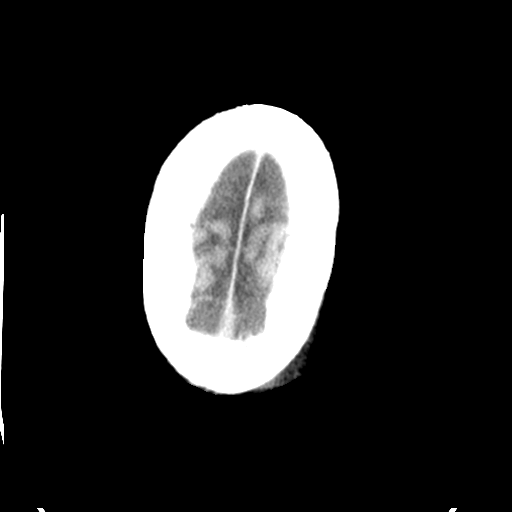

[Series 4: head bone · axial · 0.45mm/px · z∈[-112,-76]mm · 3 of 90 slices shown]
[im 9/90  bone]
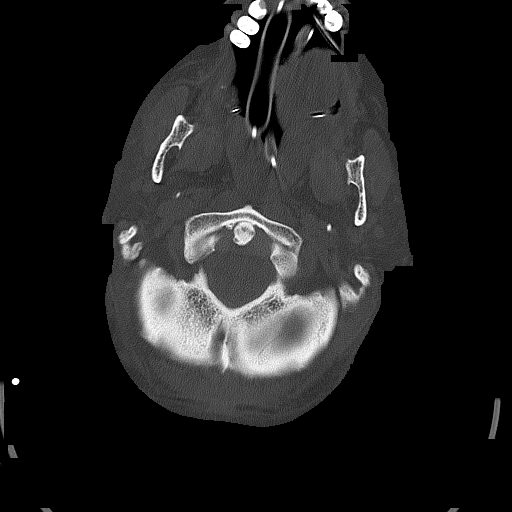
[im 18/90  bone]
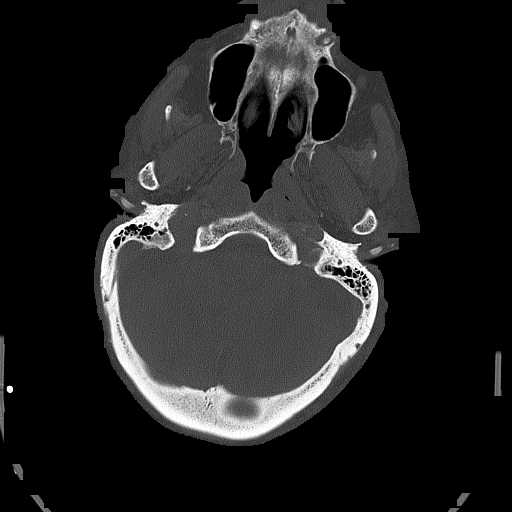
[im 27/90  bone]
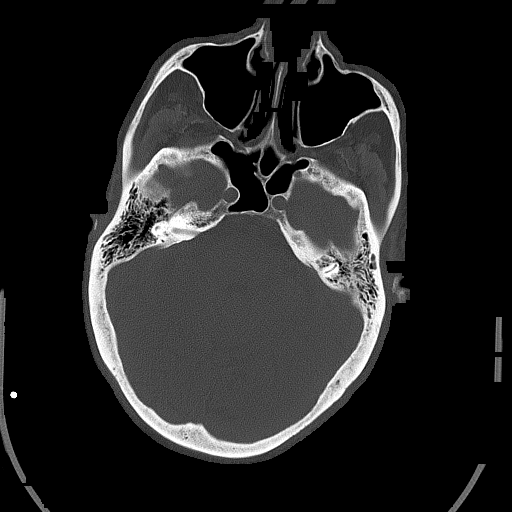

[Series 5: head without cor · coronal · non-contrast · 0.32mm/px · 3 of 75 slices shown]
[im 25/75  brain]
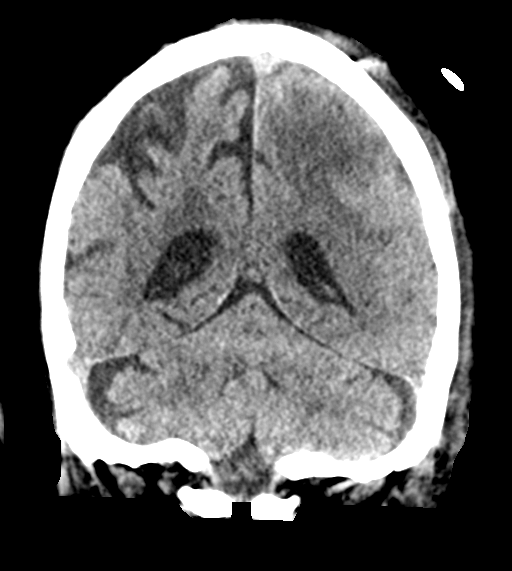
[im 33/75  brain]
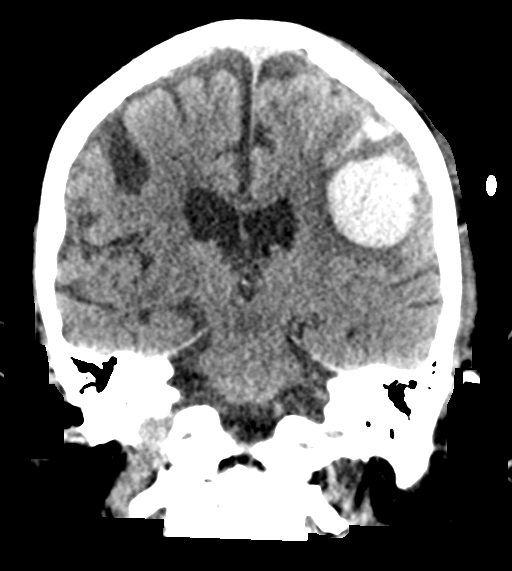
[im 42/75  brain]
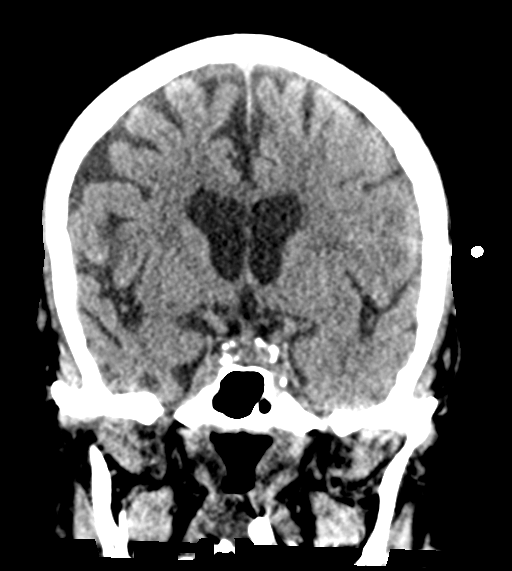

[Series 6: head without sag · sagittal · non-contrast · 0.39mm/px · 3 of 55 slices shown]
[im 19/55  brain]
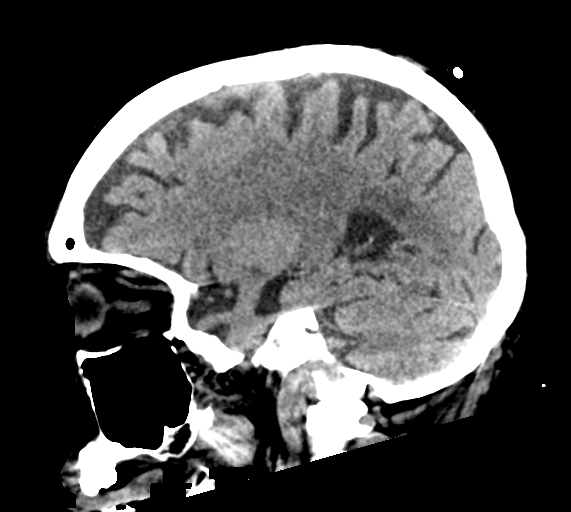
[im 28/55  brain]
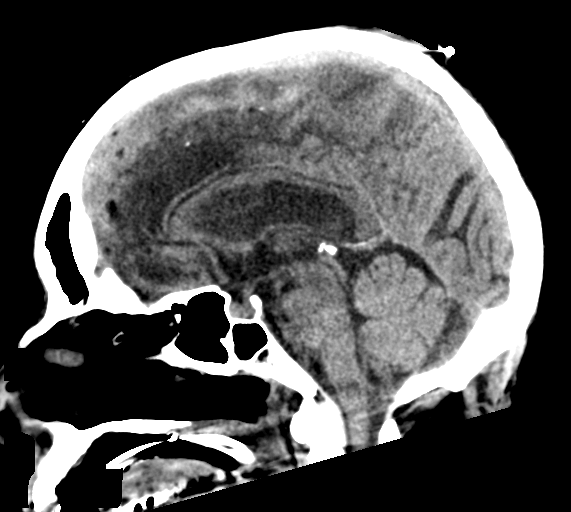
[im 37/55  brain]
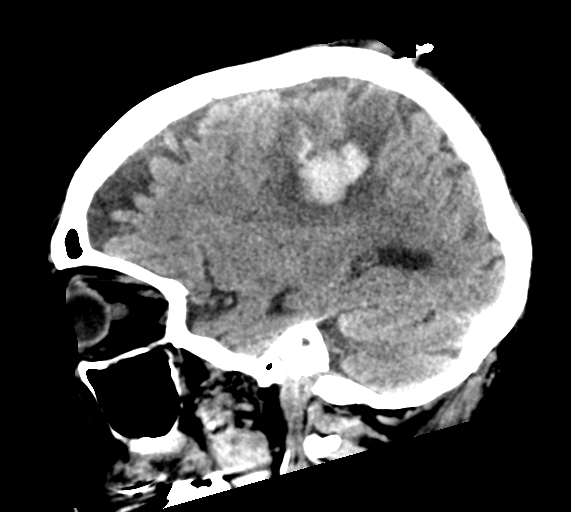

[16 of 47 positions shown; findings below may reference images not displayed]

FINDINGS: Brain: Stable size and configuration of the acute parenchymal
hematoma within the LEFT frontoparietal lobe, again measuring 4 x
2.9 cm. Surrounding parenchymal edema is grossly stable in extent.
No evidence of a significant midline shift or herniation related to
this hemorrhage and edema.

There is generalized age related parenchymal volume loss with
commensurate dilatation of the ventricles and sulci. No new
parenchymal hemorrhage or edema.

Vascular: Chronic calcified atherosclerotic changes of the large
vessels at the skull base. No unexpected hyperdense vessel.

Skull: Normal. Negative for fracture or focal lesion.

Sinuses/Orbits: No acute finding.

Other: None.
IMPRESSION: 1. Stable size and configuration of the acute parenchymal hemorrhage
within the LEFT frontoparietal lobe, again measuring 4 x 2.9 cm.
Surrounding parenchymal edema is also grossly stable in extent.
2. No new parenchymal hemorrhage or edema. No midline shift or
herniation.

## 2022-06-17 IMAGING — DX DG ABD PORTABLE 1V
1 series · 1 of 1 positions shown · non-contrast
Comparison: None.

CLINICAL DATA: Feeding tube placement

EXAM:
PORTABLE ABDOMEN - 1 VIEW

[abdomen]
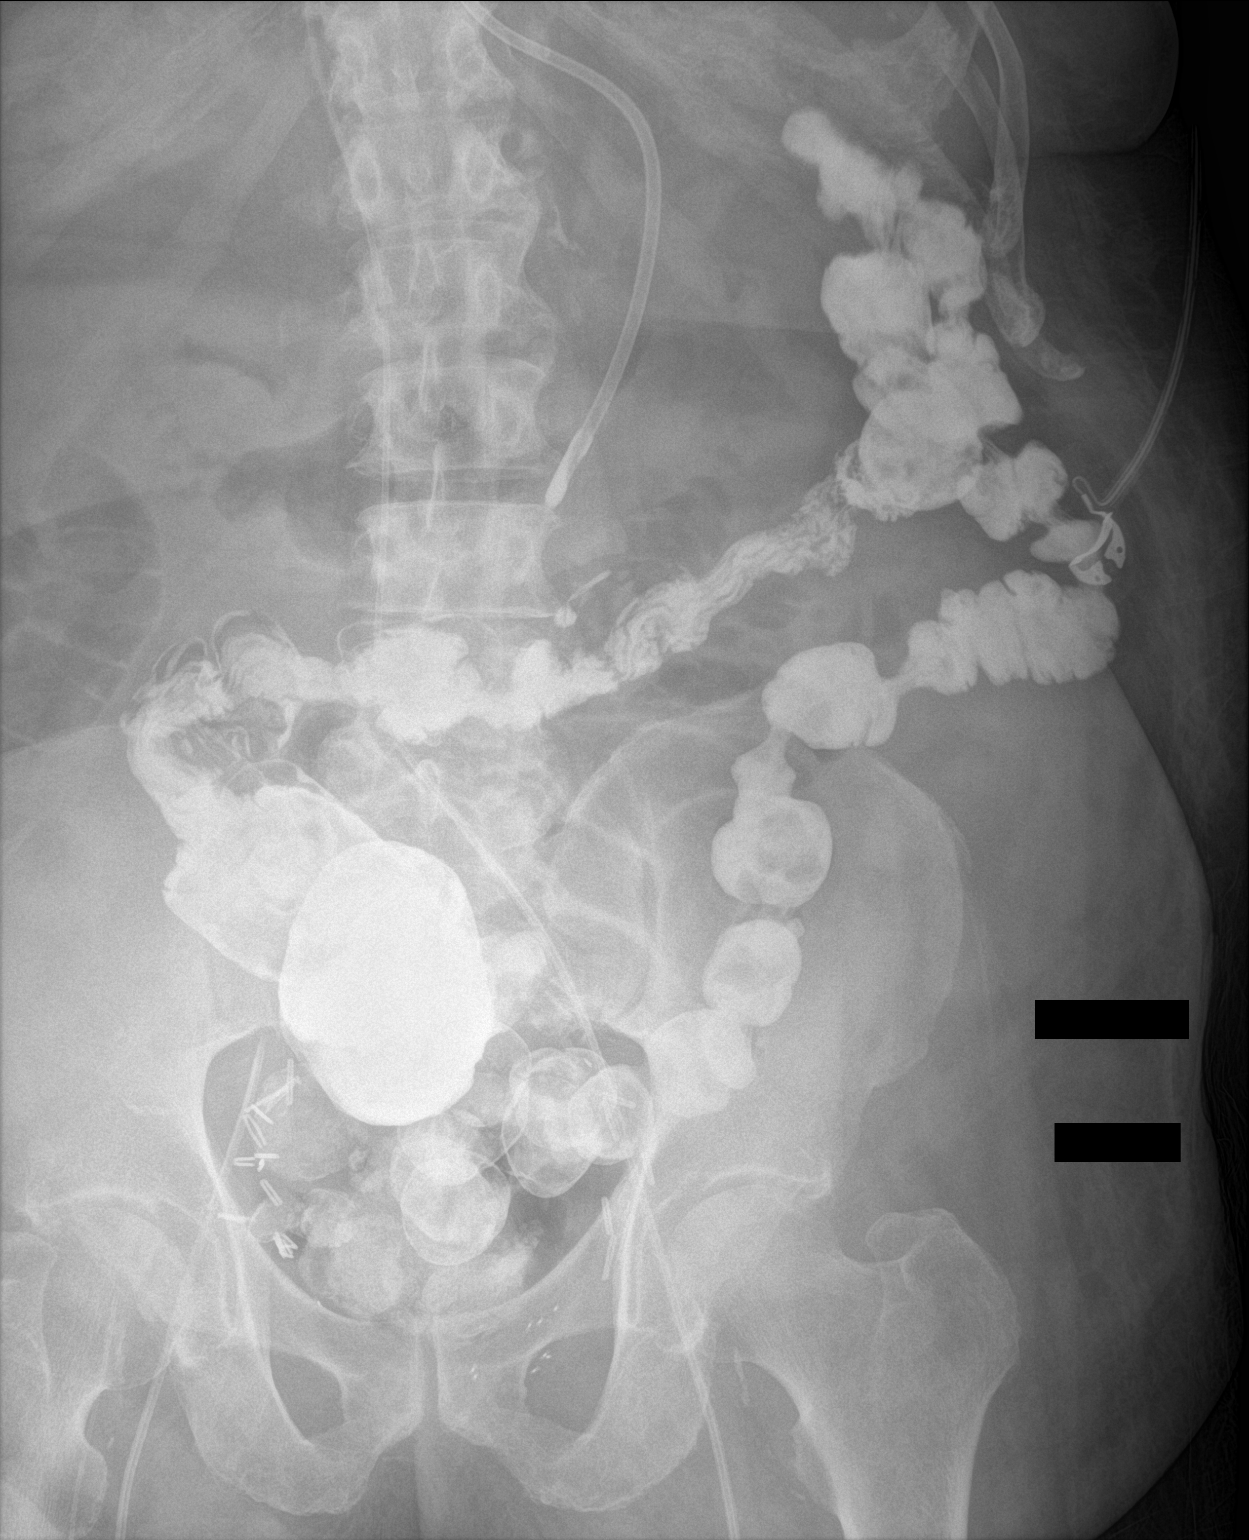

[1 of 1 positions shown; findings below may reference images not displayed]

FINDINGS: Feeding tube tip projects over the expected area of the stomach.
Retained intraluminal contrast noted in the colon. No gas-filled
dilated loops of bowel seen in the partially visualized abdomen and
pelvis. Right femoral line. Left femoral vein approach trans venous
pacing wire partially visualized.
IMPRESSION: Feeding tube tip projects over the expected area of the stomach.

## 2022-06-17 IMAGING — DX DG CHEST 1V PORT
1 series · 1 of 1 positions shown · non-contrast
Comparison: Yesterday.

CLINICAL DATA: Intubated.  Status post cardiac arrest.

EXAM:
PORTABLE CHEST 1 VIEW

[chest ap]
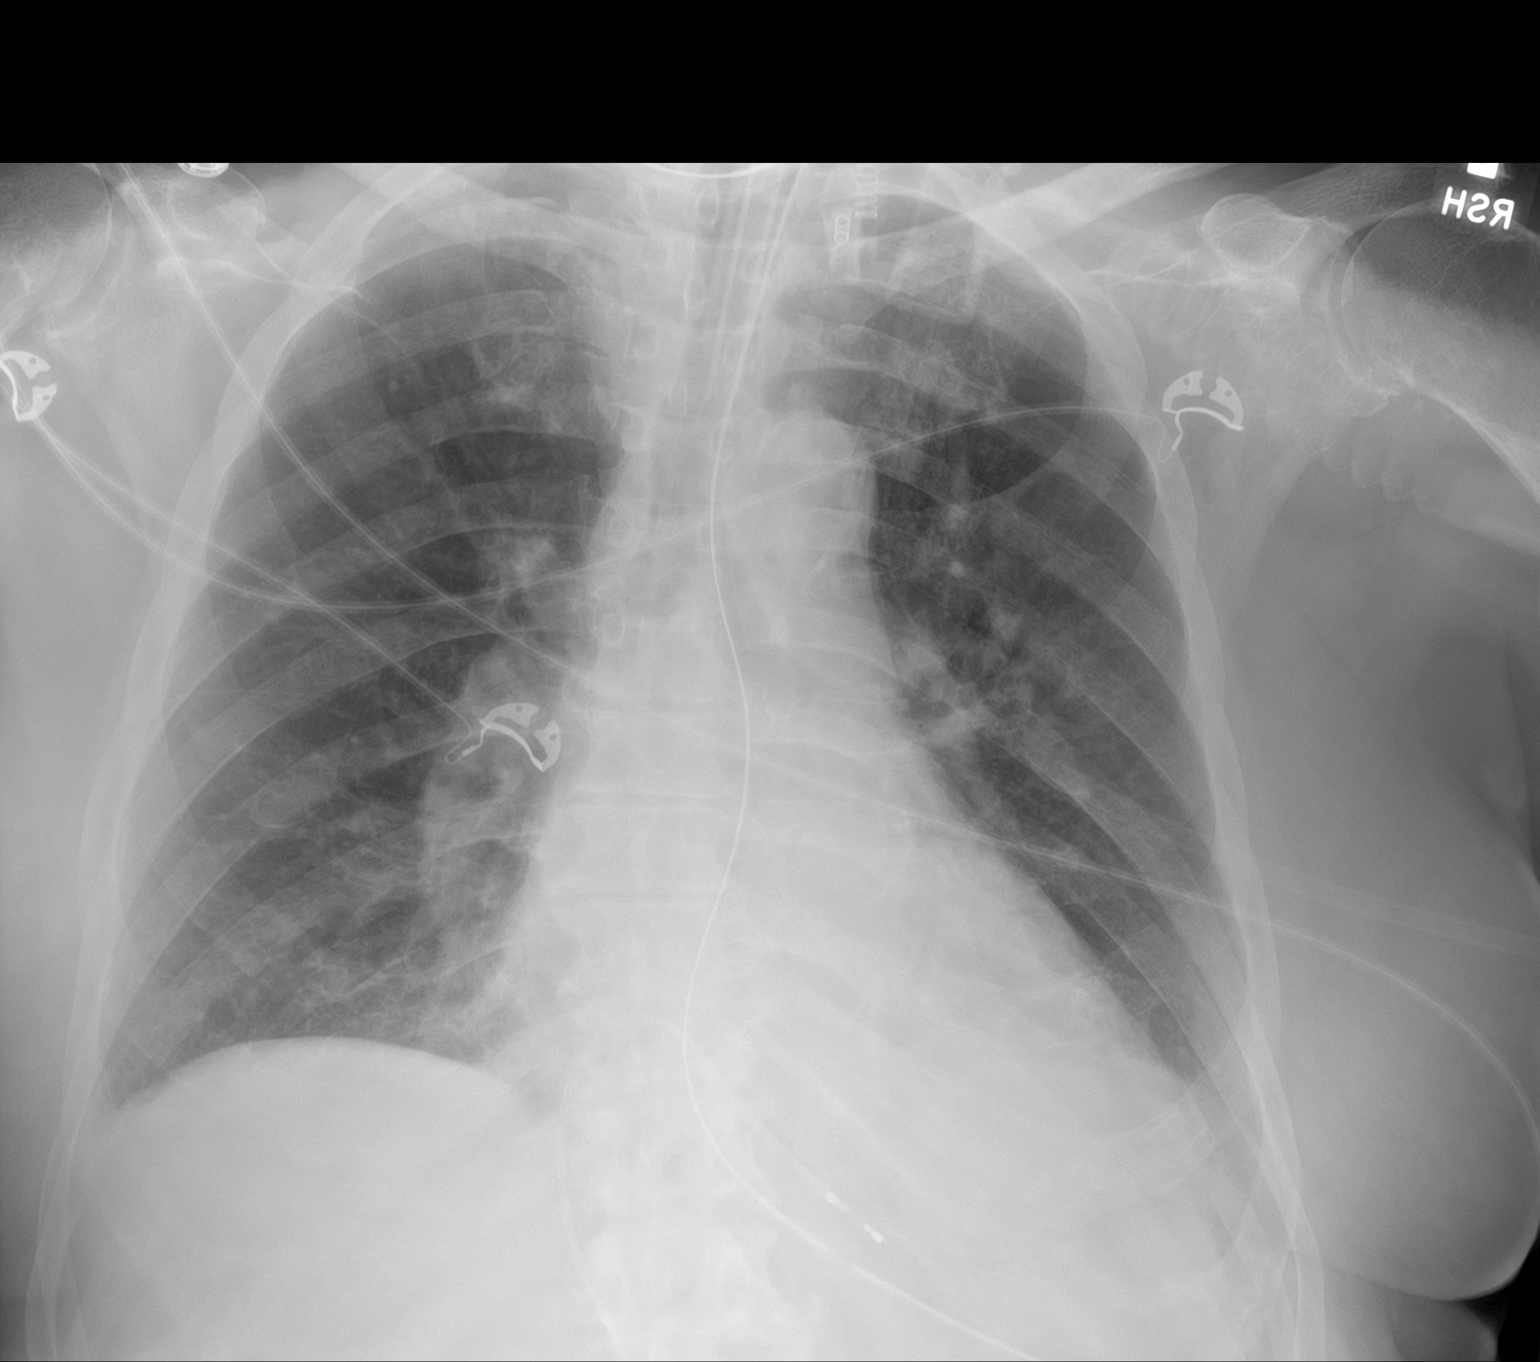

[1 of 1 positions shown; findings below may reference images not displayed]

FINDINGS: Endotracheal tube in satisfactory position. Nasogastric tube side
hole in the proximal stomach with the tip not included. Stable
borderline enlarged cardiac silhouette. Increased left lower lobe
atelectasis. Clear right lung. Thoracic spine degenerative changes.
IMPRESSION: Increased left lower lobe atelectasis.

## 2022-06-18 IMAGING — DX DG CHEST 1V PORT
1 series · 1 of 1 positions shown · non-contrast
Comparison: Chest x-ray 09/24/2021.

CLINICAL DATA: 88-year-old male with history of acute respiratory
failure.

EXAM:
PORTABLE CHEST 1 VIEW

[chest]
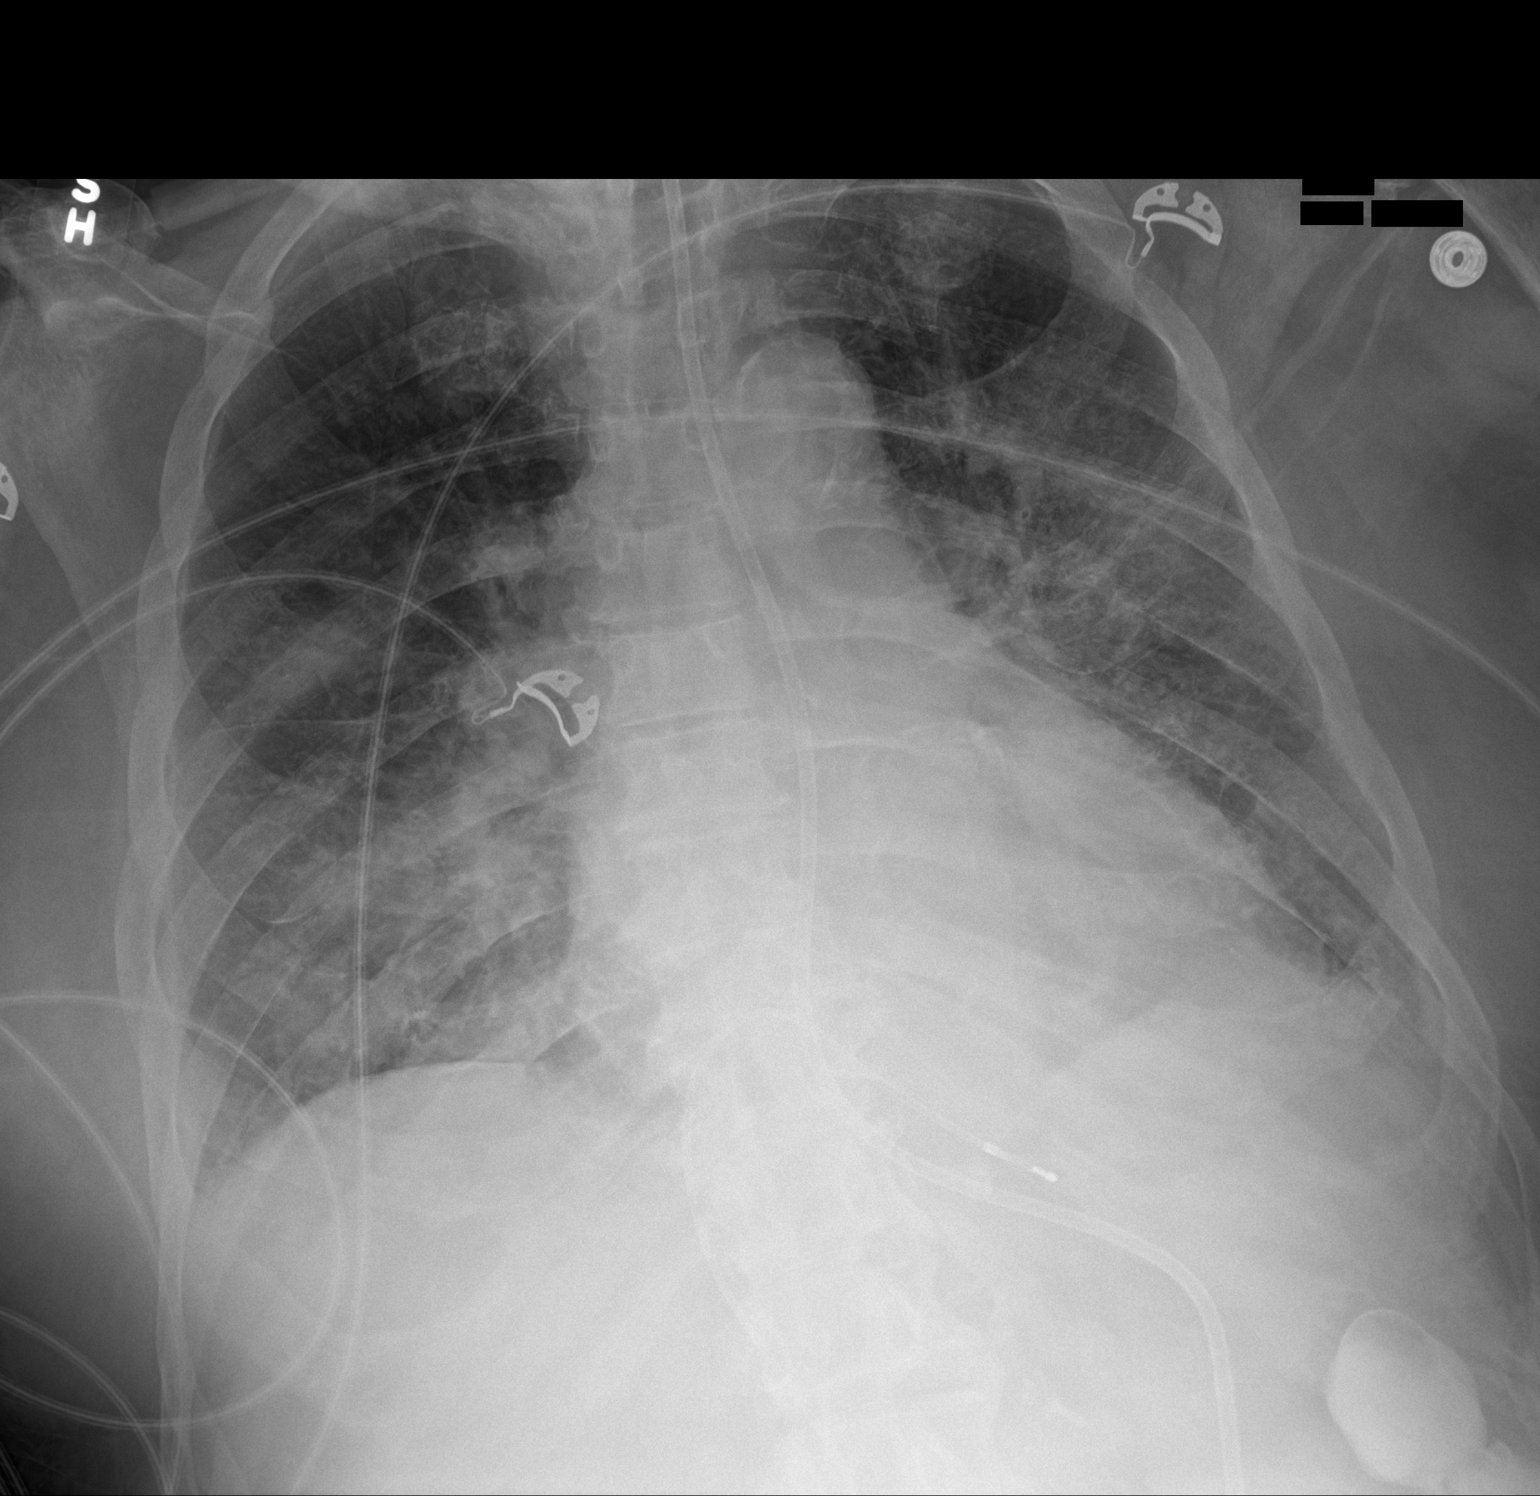

[1 of 1 positions shown; findings below may reference images not displayed]

FINDINGS: A feeding tube is seen extending into the abdomen, however, the tip
of the feeding tube extends below the lower margin of the image.
Pacemaker lead projecting over the expected location of the right
ventricular apex, presumably via transfemoral approach. Severe
multifocal interstitial and airspace disease is now noted throughout
the lungs bilaterally, most evident throughout the mid to lower
lungs. Possible small left pleural effusion. No definite right
pleural effusion. No pneumothorax. Pulmonary vasculature is
obscured. Heart size is borderline enlarged. Upper mediastinal
contours are within normal limits. Atherosclerotic calcifications
are noted in the thoracic aorta.
IMPRESSION: 1. Severe multilobar bilateral pneumonia with small left pleural
effusion.
2. Aortic atherosclerosis.
3. Support apparatus, as above.

## 2022-06-18 IMAGING — DX DG CHEST 1V PORT
1 series · 2 of 2 positions shown · non-contrast
Comparison: Chest radiograph 09/25/2021 at [DATE] a.m.

CLINICAL DATA: Encounter for intubation.

EXAM:
PORTABLE CHEST 1 VIEW

[Series 1: chest · 0.14mm/px · 2 of 2 slices shown]
[im 1/2]
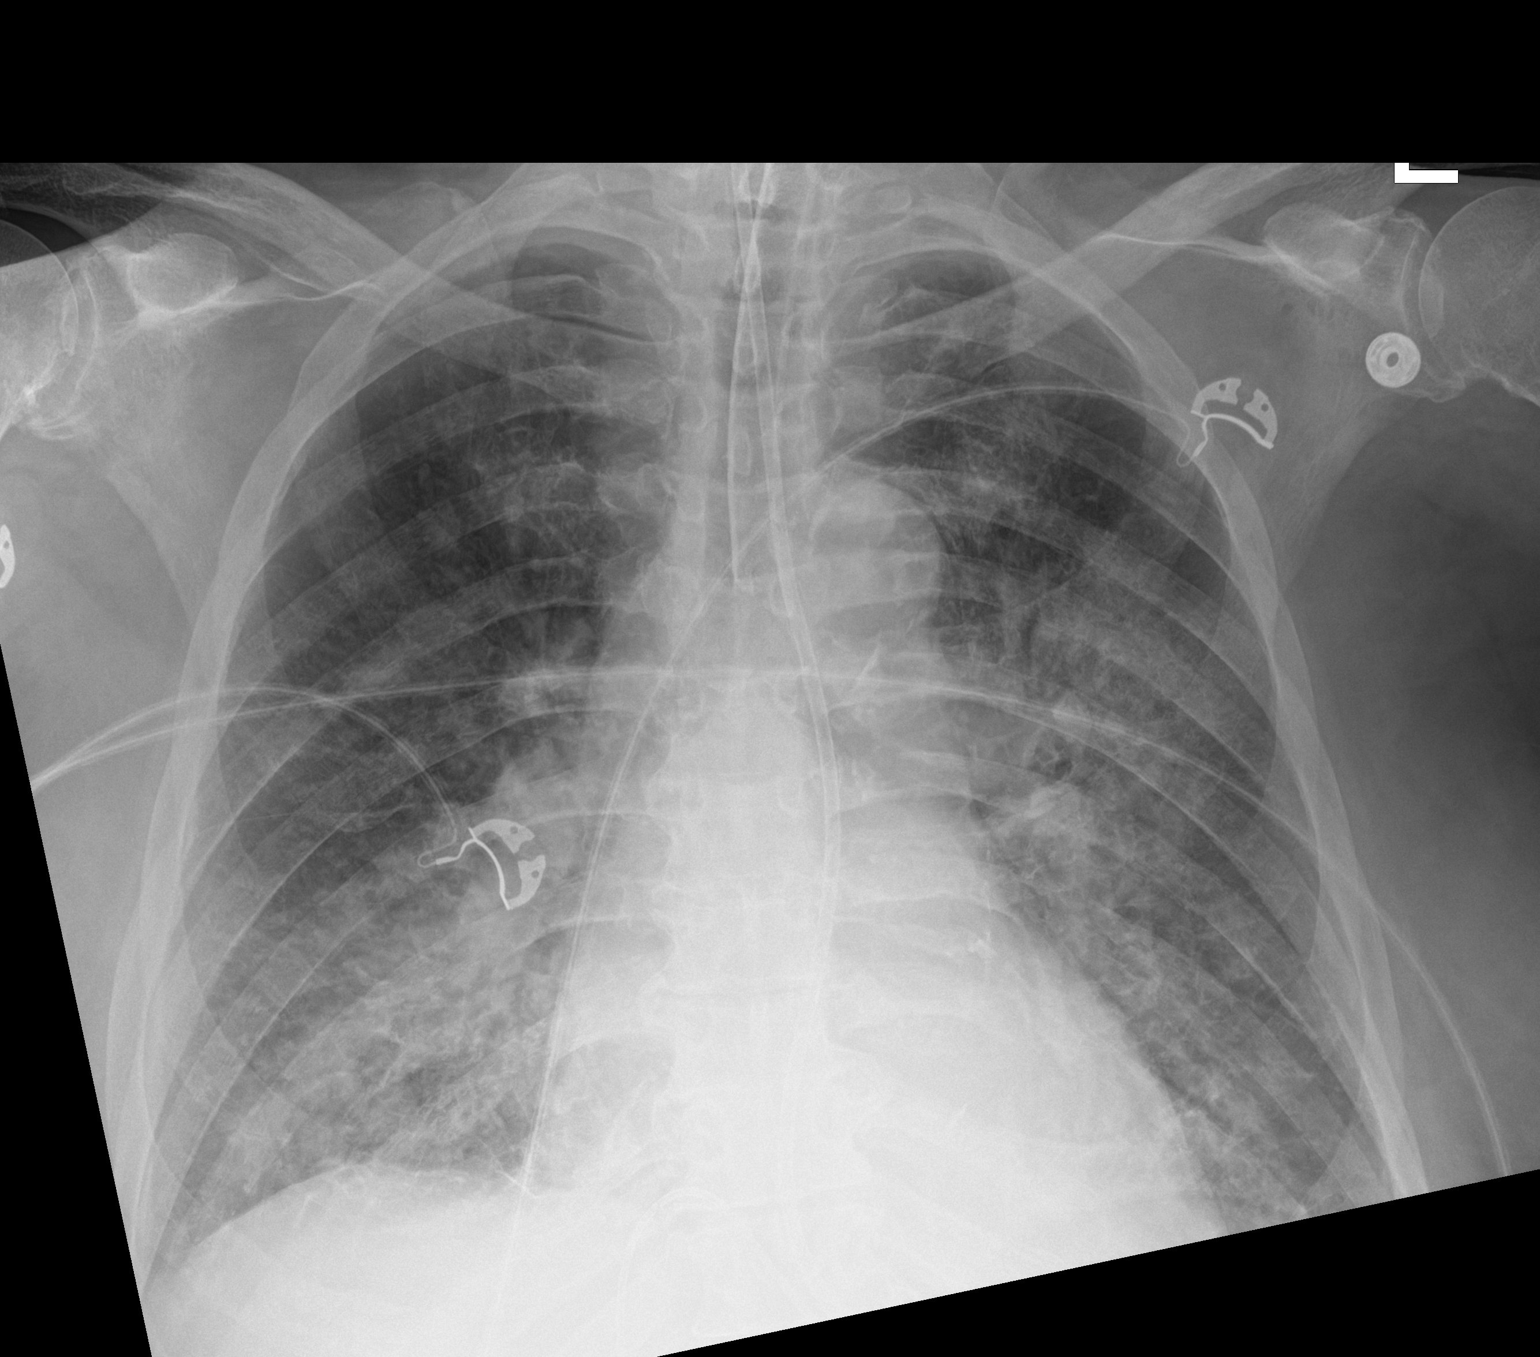
[im 2/2]
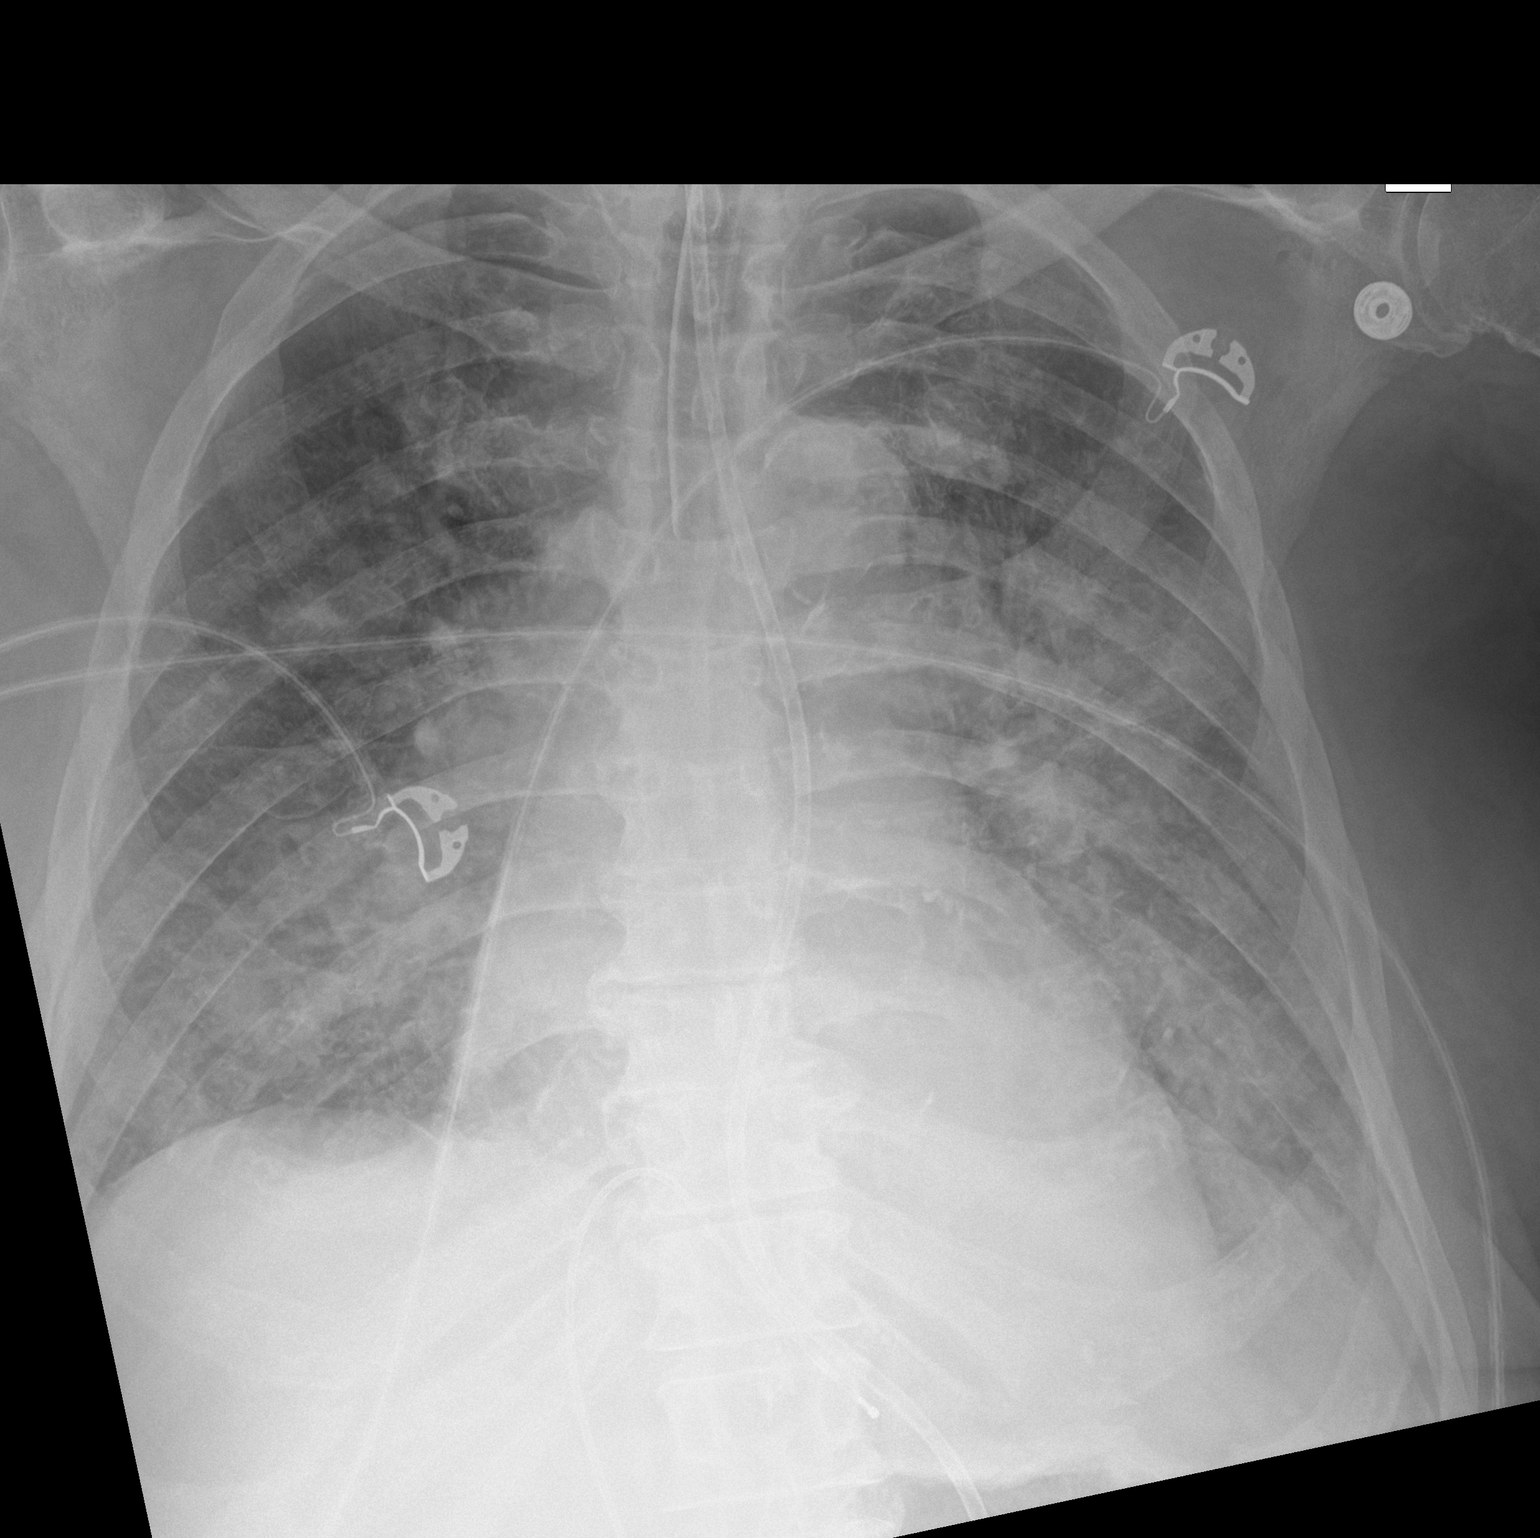

[2 of 2 positions shown; findings below may reference images not displayed]

FINDINGS: An endotracheal tube has been placed and terminates 2 cm above the
carina. A feeding tube courses into the abdomen with tip not imaged.
A pacemaker lead remains in place, projecting over the right
ventricle from an inferior approach. The cardiomediastinal
silhouette is unchanged. Widespread airspace opacities in both lungs
are unchanged. No large pleural effusion or pneumothorax is
identified.
IMPRESSION: Interval intubation. Unchanged extensive bilateral lung opacities
compatible with pneumonia.
